# Patient Record
Sex: Male | Born: 1945
Health system: Southern US, Community
[De-identification: ages and names within clinical notes are randomized; demographics above are authoritative.]

## PROBLEM LIST (undated history)

## (undated) DIAGNOSIS — I10 Essential (primary) hypertension: Secondary | ICD-10-CM

## (undated) DIAGNOSIS — E7800 Pure hypercholesterolemia, unspecified: Secondary | ICD-10-CM

## (undated) DIAGNOSIS — E119 Type 2 diabetes mellitus without complications: Secondary | ICD-10-CM

## (undated) DIAGNOSIS — M1990 Unspecified osteoarthritis, unspecified site: Secondary | ICD-10-CM

## (undated) HISTORY — PX: MINOR HEMORRHOIDECTOMY: SHX6238

## (undated) HISTORY — DX: Pure hypercholesterolemia, unspecified: E78.00

## (undated) HISTORY — PX: LAPARASCOPIC MEDICAN ARCUATE LIGAMENT RELEASE: SHX6852

---

## 2009-04-30 ENCOUNTER — Emergency Department (HOSPITAL_COMMUNITY): Admission: EM | Admit: 2009-04-30 | Discharge: 2009-05-01 | Payer: Self-pay | Admitting: Emergency Medicine

## 2009-11-02 ENCOUNTER — Emergency Department (HOSPITAL_COMMUNITY): Admission: EM | Admit: 2009-11-02 | Discharge: 2009-11-02 | Payer: Self-pay | Admitting: Emergency Medicine

## 2010-05-11 LAB — URINE CULTURE
Colony Count: NO GROWTH
Culture  Setup Time: 201109080318
Culture: NO GROWTH

## 2010-05-11 LAB — URINALYSIS, ROUTINE W REFLEX MICROSCOPIC
Bilirubin Urine: NEGATIVE
Glucose, UA: NEGATIVE mg/dL
Hgb urine dipstick: NEGATIVE
Ketones, ur: NEGATIVE mg/dL
Nitrite: NEGATIVE
Protein, ur: NEGATIVE mg/dL
Specific Gravity, Urine: 1.007 (ref 1.005–1.030)
Urobilinogen, UA: 0.2 mg/dL (ref 0.0–1.0)
pH: 6 (ref 5.0–8.0)

## 2010-05-22 LAB — URINALYSIS, ROUTINE W REFLEX MICROSCOPIC
Bilirubin Urine: NEGATIVE
Glucose, UA: 500 mg/dL — AB
Hgb urine dipstick: NEGATIVE
Ketones, ur: NEGATIVE mg/dL
Nitrite: NEGATIVE
Protein, ur: NEGATIVE mg/dL
Specific Gravity, Urine: 1.017 (ref 1.005–1.030)
Urobilinogen, UA: 0.2 mg/dL (ref 0.0–1.0)
pH: 6.5 (ref 5.0–8.0)

## 2010-05-22 LAB — URINE CULTURE
Colony Count: NO GROWTH
Culture: NO GROWTH

## 2012-09-08 ENCOUNTER — Other Ambulatory Visit: Payer: Self-pay | Admitting: *Deleted

## 2012-09-08 DIAGNOSIS — E119 Type 2 diabetes mellitus without complications: Secondary | ICD-10-CM

## 2012-09-08 DIAGNOSIS — E785 Hyperlipidemia, unspecified: Secondary | ICD-10-CM

## 2012-09-09 ENCOUNTER — Other Ambulatory Visit (INDEPENDENT_AMBULATORY_CARE_PROVIDER_SITE_OTHER): Payer: Federal, State, Local not specified - PPO

## 2012-09-09 DIAGNOSIS — E785 Hyperlipidemia, unspecified: Secondary | ICD-10-CM

## 2012-09-09 DIAGNOSIS — E119 Type 2 diabetes mellitus without complications: Secondary | ICD-10-CM

## 2012-09-09 LAB — LIPID PANEL
Cholesterol: 160 mg/dL (ref 0–200)
HDL: 52.9 mg/dL (ref >39.00)
LDL Cholesterol: 92 mg/dL (ref 0–99)
Total CHOL/HDL Ratio: 3
Triglycerides: 77 mg/dL (ref 0.0–149.0)
VLDL: 15.4 mg/dL (ref 0.0–40.0)

## 2012-09-09 LAB — BASIC METABOLIC PANEL
BUN: 16 mg/dL (ref 6–23)
CO2: 28 mEq/L (ref 19–32)
Calcium: 9.2 mg/dL (ref 8.4–10.5)
Chloride: 108 mEq/L (ref 96–112)
Creatinine, Ser: 1 mg/dL (ref 0.4–1.5)
GFR: 81.53 mL/min (ref >60.00)
Glucose, Bld: 246 mg/dL — ABNORMAL HIGH (ref 70–99)
Potassium: 3.8 mEq/L (ref 3.5–5.1)
Sodium: 140 mEq/L (ref 135–145)

## 2012-09-09 LAB — MICROALBUMIN / CREATININE URINE RATIO
Creatinine,U: 64.3 mg/dL
Microalb Creat Ratio: 0.3 mg/g (ref 0.0–30.0)
Microalb, Ur: 0.2 mg/dL (ref 0.0–1.9)

## 2012-09-09 LAB — HEMOGLOBIN A1C: Hgb A1c MFr Bld: 6.7 % — ABNORMAL HIGH (ref 4.6–6.5)

## 2012-09-11 ENCOUNTER — Encounter: Payer: Self-pay | Admitting: Endocrinology

## 2012-09-11 ENCOUNTER — Ambulatory Visit (INDEPENDENT_AMBULATORY_CARE_PROVIDER_SITE_OTHER): Payer: Federal, State, Local not specified - PPO | Admitting: Endocrinology

## 2012-09-11 VITALS — BP 132/70 | HR 75 | Resp 12 | Ht 69.0 in | Wt 162.0 lb

## 2012-09-11 DIAGNOSIS — N400 Enlarged prostate without lower urinary tract symptoms: Secondary | ICD-10-CM

## 2012-09-11 DIAGNOSIS — IMO0001 Reserved for inherently not codable concepts without codable children: Secondary | ICD-10-CM

## 2012-09-11 DIAGNOSIS — E7800 Pure hypercholesterolemia, unspecified: Secondary | ICD-10-CM

## 2012-09-11 NOTE — Progress Notes (Signed)
Patient ID: Kenneth Lawson, male   DOB: [birth year], 67 y.o.   MRN: [id number]  Reason for Appointment: Diabetes follow-up   History of Present Illness     Reason for visit: 3 month followup.   Diagnosis: date of diagnosis: 1997.   Peripheral neuropathy: Not previously diagnosed.  Diabetic nephropathy: not present.  Side effects from medications: bloating, gas And GI distress from metformin, even 500 mg.  Proper timing of medications in relation to meals: Not applicable.  Monitors blood glucose: Once a day.  Glucometer: Accucheck.   Blood Glucose readings: Blood sugar mostly in the morning and is generally in the upper normal range  Hypoglycemia frequency: Minimal, occasionally may feel weak if going longer without eating   Meals: content is Variable, may sometimes increased carbohydrate at breakfast. Cereal at times.  Physical activity: exercise: He is recently walking up to 3 miles   Certified Diabetes Educator visit: Never.  Dietician visit: Most recent: 1997 at diagnosis.  Retinal exam: Unknown, Has seen optometrist only.   Type 2 Diabetes Mellitus here for followup   PAST history: He was previously treated with glyburide, Actos and metformin.  Intolerant to metformin; has GI side effects and this was stopped.  His blood sugars had been relatively high in the last 3-4 years and A1c had been consistently over 7.5% until his consultation His blood sugars had shown considerable improvement with  adding  Nesina since his visit in 2/14 .  he had previously been given Invokana and 10/13 which had helped modestly. He was also on Actos which he had stopped but blood sugars did not appear to worsen. His blood sugars had been higher with A1c as high as 7.4 in December 2013  RECENT history:   His blood sugars continue to be well controlled current regimen.. He  has been on Invokana along with his glyburide. However he thinks that because of increase urination and some penile  irritation he prefers to take on a half tablet, blood sugars seem to be about the same with this. No side effects from this now. Checking blood sugar is somewhat less recently and at random times  .  The last HbgA1c was 6.4 in 4/14, previously 7.4 in 12/13, prior range from 7.8-8.3 since 2011, and the fructosamine in 2/14 was 313.   The microalbumin has been tested , and the result is normal .     Appointment on 09/09/2012  Component Date Value Range Status  . Hemoglobin A1C 09/09/2012 6.7* 4.6 - 6.5 % Final   Glycemic Control Guidelines for People with Diabetes:Non Diabetic:  <6%Goal of Therapy: <7%Additional Action Suggested:  >8%   . Sodium 09/09/2012 140  135 - 145 mEq/L Final  . Potassium 09/09/2012 3.8  3.5 - 5.1 mEq/L Final  . Chloride 09/09/2012 108  96 - 112 mEq/L Final  . CO2 09/09/2012 28  19 - 32 mEq/L Final  . Glucose, Bld 09/09/2012 246* 70 - 99 mg/dL Final  . BUN 40/98/1191 16  6 - 23 mg/dL Final  . Creatinine, Ser 09/09/2012 1.0  0.4 - 1.5 mg/dL Final  . Calcium 47/82/9562 9.2  8.4 - 10.5 mg/dL Final  . GFR 13/09/6576 81.53  >60.00 mL/min Final  . Cholesterol 09/09/2012 160  0 - 200 mg/dL Final   ATP III Classification       Desirable:  < 200 mg/dL               Borderline High:  200 - 239 mg/dL  High:  > = 240 mg/dL  . Triglycerides 09/09/2012 77.0  0.0 - 149.0 mg/dL Final   Normal:  <409 mg/dLBorderline High:  150 - 199 mg/dL  . HDL 09/09/2012 52.90  >39.00 mg/dL Final  . VLDL 81/19/1478 15.4  0.0 - 40.0 mg/dL Final  . LDL Cholesterol 09/09/2012 92  0 - 99 mg/dL Final  . Total CHOL/HDL Ratio 09/09/2012 3   Final                  Men          Women1/2 Average Risk     3.4          3.3Average Risk          5.0          4.42X Average Risk          9.6          7.13X Average Risk          15.0          11.0                      . Microalb, Ur 09/09/2012 0.2  0.0 - 1.9 mg/dL Final  . Creatinine,U 29/56/2130 64.3   Final  . Microalb Creat Ratio 09/09/2012 0.3  0.0 -  30.0 mg/g Final      Medication List       This list is accurate as of: 09/11/12 12:38 PM.  Always use your most recent med list.               alfuzosin 10 MG 24 hr tablet  Commonly known as:  UROXATRAL  Take 10 mg by mouth daily.     buPROPion 300 MG 24 hr tablet  Commonly known as:  WELLBUTRIN XL  Take 300 mg by mouth daily.     dutasteride 0.5 MG capsule  Commonly known as:  AVODART  Take 0.5 mg by mouth daily.     INVOKANA 300 MG Tabs  Generic drug:  Canagliflozin  Take 300 mg by mouth.     NESINA 25 MG Tabs  Generic drug:  Alogliptin Benzoate  Take 25 mg by mouth.     simvastatin 80 MG tablet  Commonly known as:  ZOCOR  Take 80 mg by mouth at bedtime.        Allergies: No Known Allergies  No past medical history on file.  No past surgical history on file.  No family history on file.  Social History:  reports that he has quit smoking. His smoking use included Cigarettes. He smoked 0.00 packs per day. He does not have any smokeless tobacco history on file. His alcohol and drug histories are not on file.  Review of Systems -   Has had hypercholesterolemia on treatment No history of hypertension Has had history of BPH   Examination:   BP 132/70  Pulse 75  Resp 12  Ht 5\' 9"  (1.753 m)  Wt 162 lb (73.483 kg)  BMI 23.91 kg/m2  SpO2 97%  Body mass index is 23.91 kg/(m^2).   No pedal edema  Assesment/Plan:   Diabetes type 2 now showing fair control. Although his A1c is slightly high at 6.7 his home readings are fairly good He is not having any significant hypoglycemia with glyburide Since he has some side effects with 300 mg Invokana and he can continue to take half tablet Again discussed balancing breakfast with more protein since  lab glucose was over 200 with eating cereal He is doing well with maintaining his weight and trying to walk regularly now   [facility] 09/11/2012, 12:38 PM

## 2012-09-11 NOTE — Patient Instructions (Addendum)
Please check blood sugars at least half the time about 2 hours after any meal and as directed on waking up.   Please bring blood sugar monitor to each visit  

## 2012-09-15 ENCOUNTER — Encounter: Payer: Self-pay | Admitting: Endocrinology

## 2012-09-15 DIAGNOSIS — N400 Enlarged prostate without lower urinary tract symptoms: Secondary | ICD-10-CM | POA: Insufficient documentation

## 2012-09-15 DIAGNOSIS — IMO0001 Reserved for inherently not codable concepts without codable children: Secondary | ICD-10-CM | POA: Insufficient documentation

## 2012-09-15 DIAGNOSIS — E7800 Pure hypercholesterolemia, unspecified: Secondary | ICD-10-CM | POA: Insufficient documentation

## 2012-10-23 ENCOUNTER — Other Ambulatory Visit: Payer: Self-pay | Admitting: *Deleted

## 2012-10-23 MED ORDER — ALOGLIPTIN BENZOATE 25 MG PO TABS: 25.0000 mg | ORAL_TABLET | Freq: Every day | ORAL | Status: DC

## 2012-12-08 ENCOUNTER — Other Ambulatory Visit: Payer: Federal, State, Local not specified - PPO

## 2012-12-09 ENCOUNTER — Other Ambulatory Visit (INDEPENDENT_AMBULATORY_CARE_PROVIDER_SITE_OTHER): Payer: Federal, State, Local not specified - PPO

## 2012-12-09 DIAGNOSIS — IMO0001 Reserved for inherently not codable concepts without codable children: Secondary | ICD-10-CM

## 2012-12-09 DIAGNOSIS — E7800 Pure hypercholesterolemia, unspecified: Secondary | ICD-10-CM

## 2012-12-09 LAB — COMPREHENSIVE METABOLIC PANEL
ALT: 18 U/L (ref 0–53)
AST: 15 U/L (ref 0–37)
Albumin: 4.2 g/dL (ref 3.5–5.2)
Alkaline Phosphatase: 53 U/L (ref 39–117)
BUN: 17 mg/dL (ref 6–23)
CO2: 27 mEq/L (ref 19–32)
Calcium: 9.1 mg/dL (ref 8.4–10.5)
Chloride: 106 mEq/L (ref 96–112)
Creatinine, Ser: 1 mg/dL (ref 0.4–1.5)
GFR: 84.44 mL/min (ref >60.00)
Glucose, Bld: 137 mg/dL — ABNORMAL HIGH (ref 70–99)
Potassium: 4.3 mEq/L (ref 3.5–5.1)
Sodium: 141 mEq/L (ref 135–145)
Total Bilirubin: 0.4 mg/dL (ref 0.3–1.2)
Total Protein: 7.1 g/dL (ref 6.0–8.3)

## 2012-12-09 LAB — LIPID PANEL
Cholesterol: 166 mg/dL (ref 0–200)
HDL: 57 mg/dL (ref >39.00)
LDL Cholesterol: 100 mg/dL — ABNORMAL HIGH (ref 0–99)
Total CHOL/HDL Ratio: 3
Triglycerides: 46 mg/dL (ref 0.0–149.0)
VLDL: 9.2 mg/dL (ref 0.0–40.0)

## 2012-12-09 LAB — MICROALBUMIN / CREATININE URINE RATIO
Creatinine,U: 73.2 mg/dL
Microalb Creat Ratio: 0.5 mg/g (ref 0.0–30.0)
Microalb, Ur: 0.4 mg/dL (ref 0.0–1.9)

## 2012-12-09 LAB — HEMOGLOBIN A1C: Hgb A1c MFr Bld: 7.2 % — ABNORMAL HIGH (ref 4.6–6.5)

## 2012-12-12 ENCOUNTER — Encounter: Payer: Self-pay | Admitting: Endocrinology

## 2012-12-12 ENCOUNTER — Ambulatory Visit (INDEPENDENT_AMBULATORY_CARE_PROVIDER_SITE_OTHER): Payer: Federal, State, Local not specified - PPO | Admitting: Endocrinology

## 2012-12-12 VITALS — BP 130/82 | HR 75 | Temp 98.5°F | Resp 12 | Ht 69.0 in | Wt 165.1 lb

## 2012-12-12 DIAGNOSIS — E7800 Pure hypercholesterolemia, unspecified: Secondary | ICD-10-CM

## 2012-12-12 DIAGNOSIS — IMO0001 Reserved for inherently not codable concepts without codable children: Secondary | ICD-10-CM

## 2012-12-12 DIAGNOSIS — Z23 Encounter for immunization: Secondary | ICD-10-CM

## 2012-12-12 NOTE — Progress Notes (Signed)
Patient ID: Kenneth Lawson, male   DOB: 1946/02/05, 67 y.o.   MRN: [id number]  Reason for Appointment: Diabetes follow-up   History of Present Illness     Diagnosis: date of diagnosis: 1997.   RECENT history:   His blood sugars are relatively higher than before and his A1c is increased slightly. His A1c has been as low as 6.4% previously He thinks this is primarily related to not exercising and also not being consistent with his diet  He  has been on Invokana along with his glyburide and NESINA. Takes Invokana half tablet twice a day which is better tolerable  Checking blood sugar is somewhat less recently and at random times   Monitors blood glucose: Once -2x a day.  Glucometer: Accucheck.   Blood Glucose readings: Blood sugar being checked mostly in the morning. Fasting readings are about 120, highest reading 200 after eating and lowest 71, did not bring monitor for download  Hypoglycemia frequency: Minimal, none recently   Meals: content is Variable, may sometimes have increased carbohydrate without any protein in the morning Snacks: chips  at times.  Physical activity: exercise: He is recently not walking, has no motivation  Dietician visit: Most recent: 1997 at diagnosis.  Retinal exam: Unknown, Has seen optometrist only.   Peripheral neuropathy: Not previously diagnosed.  Diabetic nephropathy: not present.  Side effects from medications: bloating, gas And GI distress from metformin, even 500 mg.   PAST history: He was previously treated with glyburide, Actos and metformin.  Intolerant to metformin; had GI side effects and this was stopped.  His blood sugars had been relatively high in the last 3-4 years and A1c had been consistently over 7.5% until his initial consultation His blood sugars had shown considerable improvement with  adding  Nesina since 2/14 .  He had previously been given Invokana in 10/13 which had helped modestly. He was also on Actos which he had  stopped but blood sugars did not appear to worsen. .  The previous HbgA1c levels: 6.4 in 4/14, previously 7.4 in 12/13, prior range from 7.8-8.3 since 2011, and the fructosamine in 2/14 was 313.   The microalbumin has been tested , and the result is normal .    Lab Results  Component Value Date   HGBA1C 7.2* 12/09/2012   HGBA1C 6.7* 09/09/2012   Lab Results  Component Value Date   MICROALBUR 0.4 12/09/2012   LDLCALC 100* 12/09/2012   CREATININE 1.0 12/09/2012    Appointment on 12/09/2012  Component Date Value Range Status  . Microalb, Ur 12/09/2012 0.4  0.0 - 1.9 mg/dL Final  . Creatinine,U 56/21/3086 73.2   Final  . Microalb Creat Ratio 12/09/2012 0.5  0.0 - 30.0 mg/g Final  . Hemoglobin A1C 12/09/2012 7.2* 4.6 - 6.5 % Final   Glycemic Control Guidelines for People with Diabetes:Non Diabetic:  <6%Goal of Therapy: <7%Additional Action Suggested:  >8%   . Cholesterol 12/09/2012 166  0 - 200 mg/dL Final   ATP III Classification       Desirable:  < 200 mg/dL               Borderline High:  200 - 239 mg/dL          High:  > = 578 mg/dL  . Triglycerides 12/09/2012 46.0  0.0 - 149.0 mg/dL Final   Normal:  <469 mg/dLBorderline High:  150 - 199 mg/dL  . HDL 12/09/2012 57.00  >39.00 mg/dL Final  . VLDL 62/95/2841 9.2  0.0 - 40.0 mg/dL Final  . LDL Cholesterol 12/09/2012 100* 0 - 99 mg/dL Final  . Total CHOL/HDL Ratio 12/09/2012 3   Final                  Men          Women1/2 Average Risk     3.4          3.3Average Risk          5.0          4.42X Average Risk          9.6          7.13X Average Risk          15.0          11.0                      . Sodium 12/09/2012 141  135 - 145 mEq/L Final  . Potassium 12/09/2012 4.3  3.5 - 5.1 mEq/L Final  . Chloride 12/09/2012 106  96 - 112 mEq/L Final  . CO2 12/09/2012 27  19 - 32 mEq/L Final  . Glucose, Bld 12/09/2012 137* 70 - 99 mg/dL Final  . BUN 19/14/7829 17  6 - 23 mg/dL Final  . Creatinine, Ser 12/09/2012 1.0  0.4 - 1.5 mg/dL Final  .  Total Bilirubin 12/09/2012 0.4  0.3 - 1.2 mg/dL Final  . Alkaline Phosphatase 12/09/2012 53  39 - 117 U/L Final  . AST 12/09/2012 15  0 - 37 U/L Final  . ALT 12/09/2012 18  0 - 53 U/L Final  . Total Protein 12/09/2012 7.1  6.0 - 8.3 g/dL Final  . Albumin 56/21/3086 4.2  3.5 - 5.2 g/dL Final  . Calcium 57/84/6962 9.1  8.4 - 10.5 mg/dL Final  . GFR 95/28/4132 84.44  >60.00 mL/min Final      Medication List       This list is accurate as of: 12/12/12 11:36 AM.  Always use your most recent med list.               ACCU-CHEK AVIVA PLUS test strip  Generic drug:  glucose blood  2 (two) times daily.     alfuzosin 10 MG 24 hr tablet  Commonly known as:  UROXATRAL  Take 10 mg by mouth daily.     Alogliptin Benzoate 25 MG Tabs  Commonly known as:  NESINA  Take 25 mg by mouth daily.     buPROPion 300 MG 24 hr tablet  Commonly known as:  WELLBUTRIN XL  Take 300 mg by mouth daily.     dutasteride 0.5 MG capsule  Commonly known as:  AVODART  Take 0.5 mg by mouth daily.     glyBURIDE 5 MG tablet  Commonly known as:  DIABETA  5 mg.     INVOKANA 300 MG Tabs  Generic drug:  Canagliflozin  Take 300 mg by mouth.     omeprazole 40 MG capsule  Commonly known as:  PRILOSEC  as needed.     simvastatin 80 MG tablet  Commonly known as:  ZOCOR  Take 80 mg by mouth at bedtime.        Allergies: No Known Allergies  No past medical history on file.  Past Surgical History  Procedure Laterality Date  . Minor hemorrhoidectomy N/A     Family History  Problem Relation Age of Onset  . Cancer Neg Hx  Social History:  reports that he has quit smoking. His smoking use included Cigarettes. He smoked 0.00 packs per day. He does not have any smokeless tobacco history on file. His alcohol and drug histories are not on file.  Review of Systems -   Has had hypercholesterolemia on simvastatin 80 mg. LDL is 100, should improve with better diet No history of hypertension Has had  history of BPH   Examination:   BP 130/82  Pulse 75  Temp(Src) 98.5 F (36.9 C)  Resp 12  Ht 5\' 9"  (1.753 m)  Wt 165 lb 1.6 oz (74.889 kg)  BMI 24.37 kg/m2  SpO2 97%  Body mass index is 24.37 kg/(m^2).   No pedal edema  Assesment/Plan:   Diabetes type 2 now showing less adequate control compared to last time As discussed in history of present illness he is having less compliance with his diet and exercise This he thinks is from lack of motivation and some depression Also did not bring his monitor for download A1c has increased somewhat and he is also not pleased about this  He is going to try and improve his diet and exercise regimen. Also discussed adding protein to breakfast consistently For now will continue the same medication regimen   Gregory Barrick 12/12/2012, 11:36 AM

## 2012-12-12 NOTE — Patient Instructions (Addendum)
Less carbs and more protein especially at Breakfast   Walk daily

## 2013-02-16 ENCOUNTER — Other Ambulatory Visit: Payer: Self-pay | Admitting: *Deleted

## 2013-02-16 MED ORDER — ALOGLIPTIN BENZOATE 25 MG PO TABS: 25.0000 mg | ORAL_TABLET | Freq: Every day | ORAL | Status: DC

## 2013-03-13 ENCOUNTER — Other Ambulatory Visit: Payer: Self-pay | Admitting: *Deleted

## 2013-03-13 MED ORDER — NYSTATIN 100000 UNIT/GM EX CREA: 1.0000 "application " | TOPICAL_CREAM | Freq: Two times a day (BID) | CUTANEOUS | Status: DC

## 2013-03-17 ENCOUNTER — Other Ambulatory Visit: Payer: Federal, State, Local not specified - PPO

## 2013-03-20 ENCOUNTER — Ambulatory Visit (INDEPENDENT_AMBULATORY_CARE_PROVIDER_SITE_OTHER): Payer: Federal, State, Local not specified - PPO | Admitting: Endocrinology

## 2013-03-20 ENCOUNTER — Encounter: Payer: Self-pay | Admitting: Endocrinology

## 2013-03-20 VITALS — BP 134/68 | HR 103 | Temp 98.2°F | Resp 14 | Ht 69.0 in | Wt 162.0 lb

## 2013-03-20 DIAGNOSIS — E7800 Pure hypercholesterolemia, unspecified: Secondary | ICD-10-CM

## 2013-03-20 DIAGNOSIS — E1165 Type 2 diabetes mellitus with hyperglycemia: Principal | ICD-10-CM

## 2013-03-20 DIAGNOSIS — IMO0001 Reserved for inherently not codable concepts without codable children: Secondary | ICD-10-CM

## 2013-03-20 DIAGNOSIS — F329 Major depressive disorder, single episode, unspecified: Secondary | ICD-10-CM

## 2013-03-20 DIAGNOSIS — F3289 Other specified depressive episodes: Secondary | ICD-10-CM

## 2013-03-20 DIAGNOSIS — F32A Depression, unspecified: Secondary | ICD-10-CM

## 2013-03-20 LAB — COMPREHENSIVE METABOLIC PANEL
ALT: 22 U/L (ref 0–53)
AST: 18 U/L (ref 0–37)
Albumin: 4.3 g/dL (ref 3.5–5.2)
Alkaline Phosphatase: 60 U/L (ref 39–117)
BUN: 20 mg/dL (ref 6–23)
CO2: 23 mEq/L (ref 19–32)
Calcium: 9.3 mg/dL (ref 8.4–10.5)
Chloride: 109 mEq/L (ref 96–112)
Creatinine, Ser: 1 mg/dL (ref 0.4–1.5)
GFR: 80.45 mL/min (ref >60.00)
Glucose, Bld: 196 mg/dL — ABNORMAL HIGH (ref 70–99)
Potassium: 4.3 mEq/L (ref 3.5–5.1)
Sodium: 138 mEq/L (ref 135–145)
Total Bilirubin: 0.7 mg/dL (ref 0.3–1.2)
Total Protein: 7.3 g/dL (ref 6.0–8.3)

## 2013-03-20 LAB — HEMOGLOBIN A1C: Hgb A1c MFr Bld: 7.4 % — ABNORMAL HIGH (ref 4.6–6.5)

## 2013-03-20 NOTE — Patient Instructions (Signed)
Oseni 25/30 daily  Please check blood sugars at least half the time about 2 hours after any meal and as directed on waking up. Please bring blood sugar monitor to each visit  Exercise!

## 2013-03-20 NOTE — Progress Notes (Addendum)
Patient ID: Kenneth Lawson, male   DOB: 02/21/1946, 68 y.o.   MRN: [id number]  Reason for Appointment: Diabetes follow-up   History of Present Illness    Diagnosis: date of diagnosis: 1997.   Previous history: He was previously treated with glyburide, Actos and metformin.  Metformin was stopped because he had GI side effects  His blood sugars had been relatively high in the last 3-4 years and A1c had been consistently over 7.5% until his initial consultation He was started on Invokana in 10/13 which had helped modestly. His blood sugars had shown considerable improvement with  adding  Nesina since 2/14 .    He was also on Actos which he had stopped but blood sugars did not appear to worsen. .  The previous HbgA1c levels: 6.4 in 4/14,  7.4 in 12/13, prior range from 7.8-8.3 since 2011   RECENT history:   His blood sugars are periodically higher than before and he thinks this is because of poor diet especially last month when he was more depressed. However because of smaller portions he has actually lost weight Has not been checking his blood sugar lately Also his meter has incorrect date and time He is also not exercising   He  has been on Invokana along with his glyburide and NESINA. Takes Invokana half tablet twice a day usually which is better tolerable. Has occasional balanitis treated with antifungal cream adequately Side effects from medications: bloating, gas And GI distress from metformin, even 500 mg.   Monitors blood glucose: 1 -2x a day.  Glucometer: Accucheck. Blood Glucose readings:   PREMEAL Breakfast Lunch Dinner Bedtime Overall  Glucose range:  120-230    161   110-279    Mean/median:  168     220   188    Hypoglycemia frequency: Minimal, none recently   Meals: content is Variable, less in portions but was eating more sweets Snacks: chips  at times.  Physical activity: exercise: He is walking the dog only, no brisk exercise as before  Dietician visit: Most  recent: 1997 at diagnosis.  Retinal exam: Unknown, Has seen optometrist only.   Peripheral neuropathy: Not previously diagnosed.  Diabetic nephropathy: not present.    Wt Readings from Last 3 Encounters:  03/20/13 162 lb (73.483 kg)  12/12/12 165 lb 1.6 oz (74.889 kg)  09/11/12 162 lb (73.483 kg)    Lab Results  Component Value Date   HGBA1C 7.2* 12/09/2012   HGBA1C 6.7* 09/09/2012   Lab Results  Component Value Date   MICROALBUR 0.4 12/09/2012   LDLCALC 100* 12/09/2012   CREATININE 1.0 12/09/2012    No visits with results within 1 Week(s) from this visit. Latest known visit with results is:  Appointment on 12/09/2012  Component Date Value Range Status  . Microalb, Ur 12/09/2012 0.4  0.0 - 1.9 mg/dL Final  . Creatinine,U 74/25/9563 73.2   Final  . Microalb Creat Ratio 12/09/2012 0.5  0.0 - 30.0 mg/g Final  . Hemoglobin A1C 12/09/2012 7.2* 4.6 - 6.5 % Final   Glycemic Control Guidelines for People with Diabetes:Non Diabetic:  <6%Goal of Therapy: <7%Additional Action Suggested:  >8%   . Cholesterol 12/09/2012 166  0 - 200 mg/dL Final   ATP III Classification       Desirable:  < 200 mg/dL               Borderline High:  200 - 239 mg/dL          High:  > =  240 mg/dL  . Triglycerides 12/09/2012 46.0  0.0 - 149.0 mg/dL Final   Normal:  <132<150 mg/dLBorderline High:  150 - 199 mg/dL  . HDL 12/09/2012 57.00  >39.00 mg/dL Final  . VLDL 44/01/027210/14/2014 9.2  0.0 - 53.640.0 mg/dL Final  . LDL Cholesterol 12/09/2012 100* 0 - 99 mg/dL Final  . Total CHOL/HDL Ratio 12/09/2012 3   Final                  Men          Women1/2 Average Risk     3.4          3.3Average Risk          5.0          4.42X Average Risk          9.6          7.13X Average Risk          15.0          11.0                      . Sodium 12/09/2012 141  135 - 145 mEq/L Final  . Potassium 12/09/2012 4.3  3.5 - 5.1 mEq/L Final  . Chloride 12/09/2012 106  96 - 112 mEq/L Final  . CO2 12/09/2012 27  19 - 32 mEq/L Final  . Glucose,  Bld 12/09/2012 137* 70 - 99 mg/dL Final  . BUN 64/40/347410/14/2014 17  6 - 23 mg/dL Final  . Creatinine, Ser 12/09/2012 1.0  0.4 - 1.5 mg/dL Final  . Total Bilirubin 12/09/2012 0.4  0.3 - 1.2 mg/dL Final  . Alkaline Phosphatase 12/09/2012 53  39 - 117 U/L Final  . AST 12/09/2012 15  0 - 37 U/L Final  . ALT 12/09/2012 18  0 - 53 U/L Final  . Total Protein 12/09/2012 7.1  6.0 - 8.3 g/dL Final  . Albumin 25/95/638710/14/2014 4.2  3.5 - 5.2 g/dL Final  . Calcium 56/43/329510/14/2014 9.1  8.4 - 10.5 mg/dL Final  . GFR 18/84/166010/14/2014 84.44  >60.00 mL/min Final      Medication List       This list is accurate as of: 03/20/13 11:19 AM.  Always use your most recent med list.               ACCU-CHEK AVIVA PLUS test strip  Generic drug:  glucose blood  2 (two) times daily.     alfuzosin 10 MG 24 hr tablet  Commonly known as:  UROXATRAL  Take 10 mg by mouth daily.     Alogliptin Benzoate 25 MG Tabs  Commonly known as:  NESINA  Take 25 mg by mouth daily.     buPROPion 300 MG 24 hr tablet  Commonly known as:  WELLBUTRIN XL  Take 300 mg by mouth daily.     dutasteride 0.5 MG capsule  Commonly known as:  AVODART  Take 0.5 mg by mouth daily.     glyBURIDE 5 MG tablet  Commonly known as:  DIABETA  5 mg.     hydrocortisone-pramoxine 2.5-1 % rectal cream  Commonly known as:  ANALPRAM-HC     INVOKANA 300 MG Tabs  Generic drug:  Canagliflozin  Take 300 mg by mouth.     nystatin cream  Commonly known as:  MYCOSTATIN  Apply 1 application topically 2 (two) times daily.     omeprazole 40 MG capsule  Commonly known as:  PRILOSEC  as needed.     simvastatin 80 MG tablet  Commonly known as:  ZOCOR  Take 80 mg by mouth at bedtime.        Allergies: No Known Allergies  No past medical history on file.  Past Surgical History  Procedure Laterality Date  . Minor hemorrhoidectomy N/A     Family History  Problem Relation Age of Onset  . Cancer Neg Hx     Social History:  reports that he has quit  smoking. His smoking use included Cigarettes. He smoked 0.00 packs per day. He does not have any smokeless tobacco history on file. His alcohol and drug histories are not on file.  Review of Systems -   Has had hypercholesterolemia on simvastatin 80 mg.   Lab Results  Component Value Date   CHOL 166 12/09/2012   HDL 57.00 12/09/2012   LDLCALC 100* 12/09/2012   TRIG 46.0 12/09/2012   CHOLHDL 3 12/09/2012    No history of hypertension  Has  history of BPH   Examination:   BP 134/68  Pulse 103  Temp(Src) 98.2 F (36.8 C)  Resp 14  Ht 5\' 9"  (1.753 m)  Wt 162 lb (73.483 kg)  BMI 23.91 kg/m2  SpO2 97%  Body mass index is 23.91 kg/(m^2).    Assesment/Plan:   Diabetes type 2 without obesity: His blood sugar control is significantly worse with most readings over 200 after dinner especially last month. This is from eating poorly and not exercising enough He is somewhat more motivated to do better now Also may be having some progression of his diabetes A1c pending from today  Recommendations made:  Balanced diet with enough protein including at breakfast  Restart exercise program  Test blood sugar regularly especially 2 hours after meals  Restart Actos 30 mg daily to help with insulin resistance and stabilizing beta cells. For convenience he can combine this with his NESINA, samples given today  Continue glyburide 5 mg and Invokana   Jerusalen Mateja 03/20/2013, 11:19 AM   Office Visit on 03/20/2013  Component Date Value Range Status  . Hemoglobin A1C 03/20/2013 7.4* 4.6 - 6.5 % Final   Glycemic Control Guidelines for People with Diabetes:Non Diabetic:  <6%Goal of Therapy: <7%Additional Action Suggested:  >8%   . Sodium 03/20/2013 138  135 - 145 mEq/L Final  . Potassium 03/20/2013 4.3  3.5 - 5.1 mEq/L Final  . Chloride 03/20/2013 109  96 - 112 mEq/L Final  . CO2 03/20/2013 23  19 - 32 mEq/L Final  . Glucose, Bld 03/20/2013 196* 70 - 99 mg/dL Final  . BUN 40/98/1191 20  6  - 23 mg/dL Final  . Creatinine, Ser 03/20/2013 1.0  0.4 - 1.5 mg/dL Final  . Total Bilirubin 03/20/2013 0.7  0.3 - 1.2 mg/dL Final  . Alkaline Phosphatase 03/20/2013 60  39 - 117 U/L Final  . AST 03/20/2013 18  0 - 37 U/L Final  . ALT 03/20/2013 22  0 - 53 U/L Final  . Total Protein 03/20/2013 7.3  6.0 - 8.3 g/dL Final  . Albumin 47/82/9562 4.3  3.5 - 5.2 g/dL Final  . Calcium 13/09/6576 9.3  8.4 - 10.5 mg/dL Final  . GFR 46/96/2952 80.45  >60.00 mL/min Final

## 2013-03-21 DIAGNOSIS — F32A Depression, unspecified: Secondary | ICD-10-CM | POA: Insufficient documentation

## 2013-03-21 DIAGNOSIS — F329 Major depressive disorder, single episode, unspecified: Secondary | ICD-10-CM | POA: Insufficient documentation

## 2013-03-21 MED ORDER — ALOGLIPTIN-PIOGLITAZONE 25-30 MG PO TABS: 25.0000 mg | ORAL_TABLET | Freq: Every day | ORAL | Status: DC

## 2013-03-30 ENCOUNTER — Other Ambulatory Visit: Payer: Self-pay | Admitting: *Deleted

## 2013-03-30 MED ORDER — CANAGLIFLOZIN 300 MG PO TABS: 300.0000 mg | ORAL_TABLET | Freq: Every day | ORAL | Status: DC

## 2013-05-01 ENCOUNTER — Other Ambulatory Visit (INDEPENDENT_AMBULATORY_CARE_PROVIDER_SITE_OTHER): Payer: Federal, State, Local not specified - PPO

## 2013-05-01 DIAGNOSIS — IMO0001 Reserved for inherently not codable concepts without codable children: Secondary | ICD-10-CM

## 2013-05-01 DIAGNOSIS — E1165 Type 2 diabetes mellitus with hyperglycemia: Principal | ICD-10-CM

## 2013-05-01 LAB — LIPID PANEL
Cholesterol: 167 mg/dL (ref 0–200)
HDL: 59.1 mg/dL (ref >39.00)
LDL Cholesterol: 103 mg/dL — ABNORMAL HIGH (ref 0–99)
Total CHOL/HDL Ratio: 3
Triglycerides: 27 mg/dL (ref 0.0–149.0)
VLDL: 5.4 mg/dL (ref 0.0–40.0)

## 2013-05-01 LAB — COMPREHENSIVE METABOLIC PANEL
ALT: 21 U/L (ref 0–53)
AST: 16 U/L (ref 0–37)
Albumin: 4 g/dL (ref 3.5–5.2)
Alkaline Phosphatase: 51 U/L (ref 39–117)
BUN: 22 mg/dL (ref 6–23)
CO2: 25 mEq/L (ref 19–32)
Calcium: 9.3 mg/dL (ref 8.4–10.5)
Chloride: 109 mEq/L (ref 96–112)
Creatinine, Ser: 1 mg/dL (ref 0.4–1.5)
GFR: 81.37 mL/min (ref >60.00)
Glucose, Bld: 143 mg/dL — ABNORMAL HIGH (ref 70–99)
Potassium: 4.3 mEq/L (ref 3.5–5.1)
Sodium: 142 mEq/L (ref 135–145)
Total Bilirubin: 0.6 mg/dL (ref 0.3–1.2)
Total Protein: 6.9 g/dL (ref 6.0–8.3)

## 2013-05-02 LAB — FRUCTOSAMINE: Fructosamine: 297 umol/L — ABNORMAL HIGH (ref <285)

## 2013-05-05 ENCOUNTER — Ambulatory Visit: Payer: Federal, State, Local not specified - PPO | Admitting: Endocrinology

## 2013-05-06 ENCOUNTER — Encounter: Payer: Self-pay | Admitting: Endocrinology

## 2013-05-06 ENCOUNTER — Ambulatory Visit (INDEPENDENT_AMBULATORY_CARE_PROVIDER_SITE_OTHER): Payer: Federal, State, Local not specified - PPO | Admitting: Endocrinology

## 2013-05-06 VITALS — BP 154/76 | HR 90 | Temp 97.9°F | Resp 16 | Ht 69.0 in | Wt 164.8 lb

## 2013-05-06 DIAGNOSIS — E1165 Type 2 diabetes mellitus with hyperglycemia: Principal | ICD-10-CM

## 2013-05-06 DIAGNOSIS — E7800 Pure hypercholesterolemia, unspecified: Secondary | ICD-10-CM

## 2013-05-06 DIAGNOSIS — IMO0001 Reserved for inherently not codable concepts without codable children: Secondary | ICD-10-CM

## 2013-05-06 NOTE — Patient Instructions (Signed)
Watch Carbs, more exercise

## 2013-05-06 NOTE — Progress Notes (Signed)
Patient ID: Kenneth Lawson, male   DOB: 15-Jun-1945, 68 y.o.   MRN: [id number]   Reason for Appointment: Diabetes follow-up    History of Present Illness    Diagnosis: date of diagnosis: 1997.   Previous history: He was previously treated with glyburide, Actos and metformin.  Metformin was stopped because he had GI side effects  His blood sugars had been relatively high in the last 3-4 years and A1c had been consistently over 7.5% until his initial consultation He was started on Invokana in 10/13 which had helped modestly. His blood sugars had shown considerable improvement with  adding  Nesina since 2/14 .    He was also on Actos which he had stopped but blood sugars did not appear to worsen. .  The previous HbgA1c levels: 6.4 in 4/14,  7.4 in 12/13, prior range from 7.8-8.3 since 2011   RECENT history:  His blood sugars are again higher periodically during the day but not consistently. He thinks this is again because of inconsistent diet and sometimes getting more carbohydrates with meals and snacks Also has not been exercising regularly but only doing some walking His overall blood sugar appears to be better, previously his average was about 188 Because of higher readings he was started on Actos also on the last visit which he had previously taken; has had no edema or unusual weight gain with this  He  has been on Invokana also and tolerating  half tablet twice a day  Oral hypoglycemic drugs: Oseni, Invokana, glyburide  Side effects from medications: bloating, gas And GI distress from metformin, even 500 mg.   Monitors blood glucose: 1 -2x a day.  Glucometer: Accucheck. Blood Glucose readings:   PREMEAL Breakfast Lunch afternoon   late p.m.  Overall  Glucose range:  98-185   86-229   104-217   125-186    Mean/median:     150   Hypoglycemia frequency: Minimal, none recently   Meals: content is Variable, sometimes has more Carbs, occasionally sweets Snacks: chips  Physical  activity: exercise: He is walking the dog about a mile   Dietician visit: Most recent: 1997 at diagnosis.  Retinal exam: Unknown, Has seen optometrist only.   Peripheral neuropathy: Not previously diagnosed.  Diabetic nephropathy: not present.    Wt Readings from Last 3 Encounters:  05/06/13 164 lb 12.8 oz (74.753 kg)  03/20/13 162 lb (73.483 kg)  12/12/12 165 lb 1.6 oz (74.889 kg)    Lab Results  Component Value Date   HGBA1C 7.4* 03/20/2013   HGBA1C 7.2* 12/09/2012   HGBA1C 6.7* 09/09/2012   Lab Results  Component Value Date   MICROALBUR 0.4 12/09/2012   LDLCALC 103* 05/01/2013   CREATININE 1.0 05/01/2013    Appointment on 05/01/2013  Component Date Value Ref Range Status  . Sodium 05/01/2013 142  135 - 145 mEq/L Final  . Potassium 05/01/2013 4.3  3.5 - 5.1 mEq/L Final  . Chloride 05/01/2013 109  96 - 112 mEq/L Final  . CO2 05/01/2013 25  19 - 32 mEq/L Final  . Glucose, Bld 05/01/2013 143* 70 - 99 mg/dL Final  . BUN 40/98/1191 22  6 - 23 mg/dL Final  . Creatinine, Ser 05/01/2013 1.0  0.4 - 1.5 mg/dL Final  . Total Bilirubin 05/01/2013 0.6  0.3 - 1.2 mg/dL Final  . Alkaline Phosphatase 05/01/2013 51  39 - 117 U/L Final  . AST 05/01/2013 16  0 - 37 U/L Final  . ALT 05/01/2013 21  0 - 53 U/L Final  . Total Protein 05/01/2013 6.9  6.0 - 8.3 g/dL Final  . Albumin 16/11/9602 4.0  3.5 - 5.2 g/dL Final  . Calcium 54/10/8117 9.3  8.4 - 10.5 mg/dL Final  . GFR 14/78/2956 81.37  >60.00 mL/min Final  . Fructosamine 05/01/2013 297* <285 umol/L Final   Comment:                            Variations in levels of serum proteins (albumin and immunoglobulins)                          may affect fructosamine results.                             . Cholesterol 05/01/2013 167  0 - 200 mg/dL Final   ATP III Classification       Desirable:  < 200 mg/dL               Borderline High:  200 - 239 mg/dL          High:  > = 213 mg/dL  . Triglycerides 05/01/2013 27.0  0.0 - 149.0 mg/dL Final    Normal:  <086 mg/dLBorderline High:  150 - 199 mg/dL  . HDL 05/01/2013 59.10  >39.00 mg/dL Final  . VLDL 57/84/6962 5.4  0.0 - 95.2 mg/dL Final  . LDL Cholesterol 05/01/2013 103* 0 - 99 mg/dL Final  . Total CHOL/HDL Ratio 05/01/2013 3   Final                  Men          Women1/2 Average Risk     3.4          3.3Average Risk          5.0          4.42X Average Risk          9.6          7.13X Average Risk          15.0          11.0                          Medication List       This list is accurate as of: 05/06/13  3:05 PM.  Always use your most recent med list.               ACCU-CHEK AVIVA PLUS test strip  Generic drug:  glucose blood  2 (two) times daily.     ACCU-CHEK SOFTCLIX LANCETS lancets     alfuzosin 10 MG 24 hr tablet  Commonly known as:  UROXATRAL  Take 10 mg by mouth daily.     Alogliptin-Pioglitazone 25-30 MG Tabs  Commonly known as:  OSENI  Take 25 mg by mouth daily.     buPROPion 300 MG 24 hr tablet  Commonly known as:  WELLBUTRIN XL  Take 300 mg by mouth daily.     Canagliflozin 300 MG Tabs  Commonly known as:  INVOKANA  Take 1 tablet (300 mg total) by mouth daily.     dutasteride 0.5 MG capsule  Commonly known as:  AVODART  Take 0.5 mg by mouth daily.     glyBURIDE 5 MG tablet  Commonly known as:  DIABETA  5 mg.     hydrocortisone-pramoxine 2.5-1 % rectal cream  Commonly known as:  ANALPRAM-HC     levofloxacin 500 MG tablet  Commonly known as:  LEVAQUIN     nystatin cream  Commonly known as:  MYCOSTATIN  Apply 1 application topically 2 (two) times daily.     omeprazole 40 MG capsule  Commonly known as:  PRILOSEC  as needed.     prednisoLONE acetate 1 % ophthalmic suspension  Commonly known as:  PRED FORTE     simvastatin 80 MG tablet  Commonly known as:  ZOCOR  Take 80 mg by mouth at bedtime.        Allergies: No Known Allergies  No past medical history on file.  Past Surgical History  Procedure Laterality Date  . Minor  hemorrhoidectomy N/A     Family History  Problem Relation Age of Onset  . Cancer Neg Hx     Social History:  reports that he has quit smoking. His smoking use included Cigarettes. He smoked 0.00 packs per day. He does not have any smokeless tobacco history on file. His alcohol and drug histories are not on file.  Review of Systems:   Has had hypercholesterolemia on simvastatin 80 mg.   Lab Results  Component Value Date   CHOL 167 05/01/2013   HDL 59.10 05/01/2013   LDLCALC 103* 05/01/2013   TRIG 27.0 05/01/2013   CHOLHDL 3 05/01/2013    No history of hypertension  Has  history of BPH and is due to have a prostate biopsy, he is concerned about this   Examination:   BP 154/76  Pulse 90  Temp(Src) 97.9 F (36.6 C)  Resp 16  Ht 5\' 9"  (1.753 m)  Wt 164 lb 12.8 oz (74.753 kg)  BMI 24.33 kg/m2  SpO2 96%  Body mass index is 24.33 kg/(m^2).    Assesment/Plan:   Diabetes type 2 without obesity: His blood sugar control is improving with adding Actos in 1/15 and he is now on a 4 drug regimen His fructosamine is mildly increased at 297 but recent blood sugars appear to be improving He has sporadic readings near 200 based on excessive carbohydrate intake He thinks he can get his sugar better controlled with improving his diet Also has started to walk He may be having some progression of his diabetes  A1c to be checked on followup   Calum Cormier 05/06/2013, 3:05 PM   LABS:  Appointment on 05/01/2013  Component Date Value Ref Range Status  . Sodium 05/01/2013 142  135 - 145 mEq/L Final  . Potassium 05/01/2013 4.3  3.5 - 5.1 mEq/L Final  . Chloride 05/01/2013 109  96 - 112 mEq/L Final  . CO2 05/01/2013 25  19 - 32 mEq/L Final  . Glucose, Bld 05/01/2013 143* 70 - 99 mg/dL Final  . BUN 16/11/9602 22  6 - 23 mg/dL Final  . Creatinine, Ser 05/01/2013 1.0  0.4 - 1.5 mg/dL Final  . Total Bilirubin 05/01/2013 0.6  0.3 - 1.2 mg/dL Final  . Alkaline Phosphatase 05/01/2013 51  39 - 117 U/L  Final  . AST 05/01/2013 16  0 - 37 U/L Final  . ALT 05/01/2013 21  0 - 53 U/L Final  . Total Protein 05/01/2013 6.9  6.0 - 8.3 g/dL Final  . Albumin 54/10/8117 4.0  3.5 - 5.2 g/dL Final  . Calcium 14/78/2956 9.3  8.4 - 10.5 mg/dL Final  . GFR 21/30/8657 81.37  >  60.00 mL/min Final  . Fructosamine 05/01/2013 297* <285 umol/L Final   Comment:                            Variations in levels of serum proteins (albumin and immunoglobulins)                          may affect fructosamine results.                             . Cholesterol 05/01/2013 167  0 - 200 mg/dL Final   ATP III Classification       Desirable:  < 200 mg/dL               Borderline High:  200 - 239 mg/dL          High:  > = 956240 mg/dL  . Triglycerides 05/01/2013 27.0  0.0 - 149.0 mg/dL Final   Normal:  <213<150 mg/dLBorderline High:  150 - 199 mg/dL  . HDL 05/01/2013 59.10  >39.00 mg/dL Final  . VLDL 08/65/784603/07/2013 5.4  0.0 - 96.240.0 mg/dL Final  . LDL Cholesterol 05/01/2013 103* 0 - 99 mg/dL Final  . Total CHOL/HDL Ratio 05/01/2013 3   Final                  Men          Women1/2 Average Risk     3.4          3.3Average Risk          5.0          4.42X Average Risk          9.6          7.13X Average Risk          15.0          11.0

## 2013-06-18 ENCOUNTER — Other Ambulatory Visit: Payer: Self-pay | Admitting: Endocrinology

## 2013-07-03 ENCOUNTER — Other Ambulatory Visit (INDEPENDENT_AMBULATORY_CARE_PROVIDER_SITE_OTHER): Payer: Federal, State, Local not specified - PPO

## 2013-07-03 DIAGNOSIS — IMO0001 Reserved for inherently not codable concepts without codable children: Secondary | ICD-10-CM

## 2013-07-03 DIAGNOSIS — E1165 Type 2 diabetes mellitus with hyperglycemia: Principal | ICD-10-CM

## 2013-07-03 LAB — BASIC METABOLIC PANEL WITH GFR
BUN: 18 mg/dL (ref 6–23)
CO2: 22 meq/L (ref 19–32)
Calcium: 8.9 mg/dL (ref 8.4–10.5)
Chloride: 110 meq/L (ref 96–112)
Creatinine, Ser: 1 mg/dL (ref 0.4–1.5)
GFR: 80.38 mL/min
Glucose, Bld: 200 mg/dL — ABNORMAL HIGH (ref 70–99)
Potassium: 4 meq/L (ref 3.5–5.1)
Sodium: 140 meq/L (ref 135–145)

## 2013-07-03 LAB — HEMOGLOBIN A1C: Hgb A1c MFr Bld: 7.1 % — ABNORMAL HIGH (ref 4.6–6.5)

## 2013-07-08 ENCOUNTER — Ambulatory Visit: Payer: Federal, State, Local not specified - PPO | Admitting: Endocrinology

## 2013-07-27 ENCOUNTER — Ambulatory Visit: Payer: Federal, State, Local not specified - PPO | Admitting: Endocrinology

## 2013-08-25 ENCOUNTER — Encounter: Payer: Self-pay | Admitting: Endocrinology

## 2013-08-25 ENCOUNTER — Ambulatory Visit (INDEPENDENT_AMBULATORY_CARE_PROVIDER_SITE_OTHER): Payer: Federal, State, Local not specified - PPO | Admitting: Endocrinology

## 2013-08-25 VITALS — BP 122/76 | HR 79 | Temp 98.2°F | Resp 16 | Ht 69.0 in | Wt 170.2 lb

## 2013-08-25 DIAGNOSIS — E1165 Type 2 diabetes mellitus with hyperglycemia: Principal | ICD-10-CM

## 2013-08-25 DIAGNOSIS — E7800 Pure hypercholesterolemia, unspecified: Secondary | ICD-10-CM

## 2013-08-25 DIAGNOSIS — IMO0001 Reserved for inherently not codable concepts without codable children: Secondary | ICD-10-CM

## 2013-08-25 NOTE — Progress Notes (Signed)
Patient ID: Kenneth Lawson, male   DOB: 01/09/1946, 68 y.o.   MRN: [id number]   Reason for Appointment: Diabetes follow-up    History of Present Illness    Diagnosis: date of diagnosis: 1997.   Previous history: He was previously treated with glyburide, Actos and metformin.  Metformin was stopped because he had GI side effects  His blood sugars had been relatively high in the last 3-4 years and A1c had been consistently over 7.5% until his initial consultation He was started on Invokana in 10/13 which had helped modestly. His blood sugars had shown considerable improvement with  adding  Nesina since 2/14 .    He was also on Actos which he had stopped but blood sugars did not appear to worsen. .  The previous HbgA1c levels: 6.4 in 4/14,  7.4 in 12/13, prior range from 7.8-8.3 since 2011   RECENT history:  His blood sugars are overall significantly better since his last visit This is primarily from his starting the exercise program with walking [facility] Also has cut back on carbohydrates and trying to watch his diet. He had previously lost some weight which he thinks was from depression A1c was improving as of 5/15 No hypo-glycemia with taking 5 mg glyburide  He  has been on Invokana also and is not having any candidiasis now Oral hypoglycemic drugs: Oseni, Invokana, glyburide  Side effects from medications: bloating, gas And GI distress from metformin, even 500 mg.   Monitors blood glucose: Recently 0.7 times a day  Glucometer: Accucheck. Blood Glucose readings from analysis of Accu-Chek download as follows, checking blood sugars mostly after breakfast and lunch   PREMEAL Breakfast Lunch Dinner Bedtime Overall  Glucose range:  105-168    72, 173     Mean/median:      131    POST-MEAL PC Breakfast PC Lunch PC Dinner  Glucose range:  117-215   118-183   120, 147   Mean/median:      Hypoglycemia frequency: Minimal, none recently   Meals: content is  Variable, sometimes has more portions, occasionally sweets Snacks: chips  Physical activity: exercise: He is walking the dog and Y  Dietician visit: Most recent: 1997 at diagnosis.  Retinal exam: Unknown, Has seen optometrist only.   Peripheral neuropathy: Not previously diagnosed.  Diabetic nephropathy: not present.   Wt Readings from Last 3 Encounters:  08/25/13 170 lb 3.2 oz (77.202 kg)  05/06/13 164 lb 12.8 oz (74.753 kg)  03/20/13 162 lb (73.483 kg)    Lab Results  Component Value Date   HGBA1C 7.1* 07/03/2013   HGBA1C 7.4* 03/20/2013   HGBA1C 7.2* 12/09/2012   Lab Results  Component Value Date   MICROALBUR 0.4 12/09/2012   LDLCALC 103* 05/01/2013   CREATININE 1.0 07/03/2013    No visits with results within 1 Week(s) from this visit. Latest known visit with results is:  Appointment on 07/03/2013  Component Date Value Ref Range Status  . Hemoglobin A1C 07/03/2013 7.1* 4.6 - 6.5 % Final   Glycemic Control Guidelines for People with Diabetes:Non Diabetic:  <6%Goal of Therapy: <7%Additional Action Suggested:  >8%   . Sodium 07/03/2013 140  135 - 145 mEq/L Final  . Potassium 07/03/2013 4.0  3.5 - 5.1 mEq/L Final  . Chloride 07/03/2013 110  96 - 112 mEq/L Final  . CO2 07/03/2013 22  19 - 32 mEq/L Final  . Glucose, Bld 07/03/2013 200* 70 - 99 mg/dL Final  .  BUN 07/03/2013 18  6 - 23 mg/dL Final  . Creatinine, Ser 07/03/2013 1.0  0.4 - 1.5 mg/dL Final  . Calcium 96/04/540905/09/2013 8.9  8.4 - 10.5 mg/dL Final  . GFR 81/19/147805/09/2013 80.38  >60.00 mL/min Final      Medication List       This list is accurate as of: 08/25/13 10:17 AM.  Always use your most recent med list.               ACCU-CHEK AVIVA PLUS test strip  Generic drug:  glucose blood  2 (two) times daily.     ACCU-CHEK SOFTCLIX LANCETS lancets     alfuzosin 10 MG 24 hr tablet  Commonly known as:  UROXATRAL  Take 10 mg by mouth daily.     buPROPion 300 MG 24 hr tablet  Commonly known as:  WELLBUTRIN XL  Take 300  mg by mouth daily.     Canagliflozin 300 MG Tabs  Commonly known as:  INVOKANA  Take 1 tablet (300 mg total) by mouth daily.     dutasteride 0.5 MG capsule  Commonly known as:  AVODART  Take 0.5 mg by mouth daily.     glyBURIDE 5 MG tablet  Commonly known as:  DIABETA  5 mg.     hydrocortisone-pramoxine 2.5-1 % rectal cream  Commonly known as:  ANALPRAM-HC     levofloxacin 500 MG tablet  Commonly known as:  LEVAQUIN     nystatin cream  Commonly known as:  MYCOSTATIN  Apply 1 application topically 2 (two) times daily.     omeprazole 40 MG capsule  Commonly known as:  PRILOSEC  as needed.     OSENI 25-30 MG Tabs  Generic drug:  Alogliptin-Pioglitazone  TAKE 25 MG BY MOUTH DAILY.     prednisoLONE acetate 1 % ophthalmic suspension  Commonly known as:  PRED FORTE     simvastatin 80 MG tablet  Commonly known as:  ZOCOR  Take 80 mg by mouth at bedtime.        Allergies: No Known Allergies  No past medical history on file.  Past Surgical History  Procedure Laterality Date  . Minor hemorrhoidectomy N/A     Family History  Problem Relation Age of Onset  . Cancer Neg Hx     Social History:  reports that he has quit smoking. His smoking use included Cigarettes. He smoked 0.00 packs per day. He does not have any smokeless tobacco history on file. His alcohol and drug histories are not on file.  Review of Systems:   Has had hypercholesterolemia on simvastatin 80 mg.   Lab Results  Component Value Date   CHOL 167 05/01/2013   HDL 59.10 05/01/2013   LDLCALC 103* 05/01/2013   TRIG 27.0 05/01/2013   CHOLHDL 3 05/01/2013    No history of hypertension  Has  history of BPH and prostate biopsy was negative   Examination:   BP 122/76  Pulse 79  Temp(Src) 98.2 F (36.8 C)  Resp 16  Ht 5\' 9"  (1.753 m)  Wt 170 lb 3.2 oz (77.202 kg)  BMI 25.12 kg/m2  SpO2 97%  Body mass index is 25.12 kg/(m^2).    Assesment/Plan:   Diabetes type 2 without obesity: His blood sugar  control is improving with a 4 drug regimen He is tolerating all his medications well without side effects A1c in 5/15 was 7.1 and hopefully will improve further Blood sugars average 131 at home although not as many  readings after dinner Also has started to exercise and this is helping A1c to be checked again on followup   [place]a A[place] 08/25/2013, 10:17 AM   LABS:  No visits with results within 1 Week(s) from this visit. Latest known visit with results is:  Appointment on 07/03/2013  Component Date Value Ref Range Status  . Hemoglobin A1C 07/03/2013 7.1* 4.6 - 6.5 % Final   Glycemic Control Guidelines for People with Diabetes:Non Diabetic:  <6%Goal of Therapy: <7%Additional Action Suggested:  >8%   . Sodium 07/03/2013 140  135 - 145 mEq/L Final  . Potassium 07/03/2013 4.0  3.5 - 5.1 mEq/L Final  . Chloride 07/03/2013 110  96 - 112 mEq/L Final  . CO2 07/03/2013 22  19 - 32 mEq/L Final  . Glucose, Bld 07/03/2013 200* 70 - 99 mg/dL Final  . BUN 04/54/0981 18  6 - 23 mg/dL Final  . Creatinine, Ser 07/03/2013 1.0  0.4 - 1.5 mg/dL Final  . Calcium 19/14/7829 8.9  8.4 - 10.5 mg/dL Final  . GFR 56/21/3086 80.38  >60.00 mL/min Final

## 2013-09-09 ENCOUNTER — Other Ambulatory Visit: Payer: Self-pay | Admitting: Endocrinology

## 2013-11-18 ENCOUNTER — Other Ambulatory Visit (INDEPENDENT_AMBULATORY_CARE_PROVIDER_SITE_OTHER): Payer: Federal, State, Local not specified - PPO

## 2013-11-18 DIAGNOSIS — IMO0001 Reserved for inherently not codable concepts without codable children: Secondary | ICD-10-CM

## 2013-11-18 DIAGNOSIS — E7800 Pure hypercholesterolemia, unspecified: Secondary | ICD-10-CM

## 2013-11-18 DIAGNOSIS — E1165 Type 2 diabetes mellitus with hyperglycemia: Principal | ICD-10-CM

## 2013-11-18 LAB — COMPREHENSIVE METABOLIC PANEL
ALT: 22 U/L (ref 0–53)
AST: 19 U/L (ref 0–37)
Albumin: 4.1 g/dL (ref 3.5–5.2)
Alkaline Phosphatase: 56 U/L (ref 39–117)
BUN: 24 mg/dL — ABNORMAL HIGH (ref 6–23)
CO2: 23 mEq/L (ref 19–32)
Calcium: 9 mg/dL (ref 8.4–10.5)
Chloride: 109 mEq/L (ref 96–112)
Creatinine, Ser: 1.2 mg/dL (ref 0.4–1.5)
GFR: 66.87 mL/min (ref >60.00)
Glucose, Bld: 197 mg/dL — ABNORMAL HIGH (ref 70–99)
Potassium: 4.1 mEq/L (ref 3.5–5.1)
Sodium: 138 mEq/L (ref 135–145)
Total Bilirubin: 0.6 mg/dL (ref 0.2–1.2)
Total Protein: 7.3 g/dL (ref 6.0–8.3)

## 2013-11-18 LAB — LIPID PANEL
Cholesterol: 197 mg/dL (ref 0–200)
HDL: 56.4 mg/dL (ref >39.00)
LDL Cholesterol: 128 mg/dL — ABNORMAL HIGH (ref 0–99)
NonHDL: 140.6
Total CHOL/HDL Ratio: 3
Triglycerides: 64 mg/dL (ref 0.0–149.0)
VLDL: 12.8 mg/dL (ref 0.0–40.0)

## 2013-11-18 LAB — HEMOGLOBIN A1C: Hgb A1c MFr Bld: 6.9 % — ABNORMAL HIGH (ref 4.6–6.5)

## 2013-11-18 LAB — MICROALBUMIN / CREATININE URINE RATIO
Creatinine,U: 103.5 mg/dL
Microalb Creat Ratio: 0.2 mg/g (ref 0.0–30.0)
Microalb, Ur: 0.2 mg/dL (ref 0.0–1.9)

## 2013-11-18 LAB — LDL CHOLESTEROL, DIRECT: Direct LDL: 134.6 mg/dL

## 2013-11-25 ENCOUNTER — Ambulatory Visit (INDEPENDENT_AMBULATORY_CARE_PROVIDER_SITE_OTHER): Payer: Federal, State, Local not specified - PPO | Admitting: Endocrinology

## 2013-11-25 ENCOUNTER — Encounter: Payer: Self-pay | Admitting: Endocrinology

## 2013-11-25 VITALS — BP 130/73 | HR 84 | Temp 98.1°F | Resp 14 | Ht 69.0 in | Wt 168.2 lb

## 2013-11-25 DIAGNOSIS — Z23 Encounter for immunization: Secondary | ICD-10-CM

## 2013-11-25 DIAGNOSIS — E1165 Type 2 diabetes mellitus with hyperglycemia: Principal | ICD-10-CM

## 2013-11-25 DIAGNOSIS — IMO0001 Reserved for inherently not codable concepts without codable children: Secondary | ICD-10-CM

## 2013-11-25 DIAGNOSIS — E7800 Pure hypercholesterolemia, unspecified: Secondary | ICD-10-CM

## 2013-11-25 MED ORDER — ROSUVASTATIN CALCIUM 20 MG PO TABS: 40.0000 mg | ORAL_TABLET | Freq: Every day | ORAL | Status: DC

## 2013-11-25 NOTE — Progress Notes (Signed)
Patient ID: Kenneth Lawson, male   DOB: 1945-12-19, 68 y.o.   MRN: [id number]   Reason for Appointment: Diabetes follow-up    History of Present Illness    Diagnosis: date of diagnosis: 1997.   Previous history: He was previously treated with glyburide, Actos and metformin.  Metformin was stopped because he had GI side effects  His blood sugars had been relatively high in the last 3-4 years and A1c had been consistently over 7.5% until his initial consultation He was started on Invokana in 10/13 which had helped modestly. His blood sugars had shown considerable improvement with  adding  Nesina since 2/14 .    He was also on Actos which he had stopped but blood sugars did not appear to worsen. .  The previous HbgA1c levels: 6.4 in 4/14,  7.4 in 12/13, prior range from 7.8-8.3 since 2011   RECENT history:  His blood sugars are overall fair with A1c under 7% but apparently glucose readings are inconsistent at home He thinks he had high readings for about a week partly from his traveling; only once has seen a reading as high as 210 However his blood sugar in the lab was 197, not clear if this was postprandial His glucose monitor is probably 66 years old Overall control is probably fairly good since he is trying to walk regularly although has not gone to the gym as before. He is mostly trying to watch his diet; tends to go off the diet to if he has issues with depression No hypoglycemia with taking 5 mg glyburide  He  has been on Invokana also and is not having any candidiasis with this  Oral hypoglycemic drugs: Oseni, Invokana, glyburide  Side effects from medications: bloating, gas And GI distress from metformin, even 500 mg.   Monitors blood glucose: Recently less regularly  Glucometer: Accucheck. Blood Glucose readings from recall  PC 118-133 and fasting as low as 90  Hypoglycemia frequency: Minimal, none recently   Meals: content is Variable, sometimes off diet, may be getting more  dairy products at times Physical activity: exercise: He is walking outside mostly come up to 2 miles a day  Dietician visit: Most recent: 1997 at diagnosis.  Retinal exam: 07/24/13, history of mild nonproliferative retinopathy OD  Peripheral neuropathy: Not previously diagnosed.  Diabetic nephropathy: not present.   Wt Readings from Last 3 Encounters:  11/25/13 168 lb 3.2 oz (76.295 kg)  08/25/13 170 lb 3.2 oz (77.202 kg)  05/06/13 164 lb 12.8 oz (74.753 kg)    Lab Results  Component Value Date   HGBA1C 6.9* 11/18/2013   HGBA1C 7.1* 07/03/2013   HGBA1C 7.4* 03/20/2013   Lab Results  Component Value Date   MICROALBUR 0.2 11/18/2013   LDLCALC 128* 11/18/2013   CREATININE 1.2 11/18/2013    No visits with results within 1 Week(s) from this visit. Latest known visit with results is:  Appointment on 11/18/2013  Component Date Value Ref Range Status  . Hemoglobin A1C 11/18/2013 6.9* 4.6 - 6.5 % Final   Glycemic Control Guidelines for People with Diabetes:Non Diabetic:  <6%Goal of Therapy: <7%Additional Action Suggested:  >8%   . Sodium 11/18/2013 138  135 - 145 mEq/L Final  . Potassium 11/18/2013 4.1  3.5 - 5.1 mEq/L Final  . Chloride 11/18/2013 109  96 - 112 mEq/L Final  . CO2 11/18/2013 23  19 - 32 mEq/L Final  . Glucose, Bld 11/18/2013 197* 70 - 99 mg/dL Final  . BUN 40/98/1191 24*  6 - 23 mg/dL Final  . Creatinine, Ser 11/18/2013 1.2  0.4 - 1.5 mg/dL Final  . Total Bilirubin 11/18/2013 0.6  0.2 - 1.2 mg/dL Final  . Alkaline Phosphatase 11/18/2013 56  39 - 117 U/L Final  . AST 11/18/2013 19  0 - 37 U/L Final  . ALT 11/18/2013 22  0 - 53 U/L Final  . Total Protein 11/18/2013 7.3  6.0 - 8.3 g/dL Final  . Albumin 16/11/9602 4.1  3.5 - 5.2 g/dL Final  . Calcium 54/10/8117 9.0  8.4 - 10.5 mg/dL Final  . GFR 14/78/2956 66.87  >60.00 mL/min Final  . Cholesterol 11/18/2013 197  0 - 200 mg/dL Final   ATP III Classification       Desirable:  < 200 mg/dL               Borderline High:  200  - 239 mg/dL          High:  > = 213 mg/dL  . Triglycerides 11/18/2013 64.0  0.0 - 149.0 mg/dL Final   Normal:  <086 mg/dLBorderline High:  150 - 199 mg/dL  . HDL 11/18/2013 56.40  >39.00 mg/dL Final  . VLDL 57/84/6962 12.8  0.0 - 40.0 mg/dL Final  . LDL Cholesterol 11/18/2013 128* 0 - 99 mg/dL Final  . Total CHOL/HDL Ratio 11/18/2013 3   Final                  Men          Women1/2 Average Risk     3.4          3.3Average Risk          5.0          4.42X Average Risk          9.6          7.13X Average Risk          15.0          11.0                      . NonHDL 11/18/2013 140.60   Final   NOTE:  Non-HDL goal should be 30 mg/dL higher than patient's LDL goal (i.e. LDL goal of < 70 mg/dL, would have non-HDL goal of < 100 mg/dL)  . Direct LDL 11/18/2013 134.6   Final   Optimal:  <100 mg/dLNear or Above Optimal:  100-129 mg/dLBorderline High:  130-159 mg/dLHigh:  160-189 mg/dLVery High:  >190 mg/dL  . Microalb, Ur 11/18/2013 0.2  0.0 - 1.9 mg/dL Final  . Creatinine,U 95/28/4132 103.5   Final  . Microalb Creat Ratio 11/18/2013 0.2  0.0 - 30.0 mg/g Final      Medication List       This list is accurate as of: 11/25/13  9:58 AM.  Always use your most recent med list.               ACCU-CHEK AVIVA PLUS test strip  Generic drug:  glucose blood  2 (two) times daily.     ACCU-CHEK SOFTCLIX LANCETS lancets     alfuzosin 10 MG 24 hr tablet  Commonly known as:  UROXATRAL  Take 10 mg by mouth daily.     ALPRAZolam 0.5 MG tablet  Commonly known as:  XANAX     buPROPion 300 MG 24 hr tablet  Commonly known as:  WELLBUTRIN XL  Take 300 mg by mouth daily.  dutasteride 0.5 MG capsule  Commonly known as:  AVODART  Take 0.5 mg by mouth daily.     glyBURIDE 5 MG tablet  Commonly known as:  DIABETA  5 mg.     hydrocortisone-pramoxine 2.5-1 % rectal cream  Commonly known as:  ANALPRAM-HC     INVOKANA 300 MG Tabs  Generic drug:  Canagliflozin  TAKE 1 TABLET (300 MG TOTAL) BY MOUTH  DAILY.     levofloxacin 500 MG tablet  Commonly known as:  LEVAQUIN     nystatin cream  Commonly known as:  MYCOSTATIN  Apply 1 application topically 2 (two) times daily.     omeprazole 40 MG capsule  Commonly known as:  PRILOSEC  as needed.     OSENI 25-30 MG Tabs  Generic drug:  Alogliptin-Pioglitazone  TAKE 25 MG BY MOUTH DAILY.     prednisoLONE acetate 1 % ophthalmic suspension  Commonly known as:  PRED FORTE     simvastatin 80 MG tablet  Commonly known as:  ZOCOR  Take 80 mg by mouth at bedtime.        Allergies: No Known Allergies  No past medical history on file.  Past Surgical History  Procedure Laterality Date  . Minor hemorrhoidectomy N/A     Family History  Problem Relation Age of Onset  . Cancer Neg Hx     Social History:  reports that he has quit smoking. His smoking use included Cigarettes. He smoked 0.00 packs per day. He does not have any smokeless tobacco history on file. His alcohol and drug histories are not on file.  Review of Systems:   Has had hypercholesterolemia, is on simvastatin 80 mg.  He says he is very compliant with the medication and is in good with fat intake but LDL is significantly higher especially direct LDL Has not discussed this with his PCP recently  Lab Results  Component Value Date   CHOL 197 11/18/2013   HDL 56.40 11/18/2013   LDLCALC 128* 11/18/2013   LDLDIRECT 134.6 11/18/2013   TRIG 64.0 11/18/2013   CHOLHDL 3 11/18/2013    No history of hypertension and blood pressure is still excellent  Has  history of BPH    Examination:   BP 130/73  Pulse 84  Temp(Src) 98.1 F (36.7 C)  Resp 14  Ht 5\' 9"  (1.753 m)  Wt 168 lb 3.2 oz (76.295 kg)  BMI 24.83 kg/m2  SpO2 96%  Body mass index is 24.83 kg/(m^2).   Diabetic foot exam shows normal monofilament sensation in the toes and plantar surfaces, no skin lesions or ulcers on the feet and normal pedal pulses  Assesment/Plan:   Diabetes type 2 with BMI 25  His blood  sugar control is fairly good overall with a 4 drug regimen and A1c now under 7 Weight has improved slightly and he is generally doing well with walking program Has occasional postprandial hyperglycemia based on his diet including on the lab work He is tolerating all his medications well without side effects He does think that he can exercise more regularly at the gym Also needs to check more postprandial blood sugars which he is probably not doing Will give him a new Accu-Chek meter in case his previous one is inaccurate  HYPERCHOLESTEROLEMIA:  LDL is significantly off from his previous levels and will change him to Crestor instead of simvastatin Co-pay card given for his 90 day prescription Discussed saturated fat intake and given him brochure on hypercholesterolemia and medications  Counseling time over 50% of today's 25 minute visit   Kynzlie Hilleary 11/25/2013, 9:58 AM   LABS:  No visits with results within 1 Week(s) from this visit. Latest known visit with results is:  Appointment on 11/18/2013  Component Date Value Ref Range Status  . Hemoglobin A1C 11/18/2013 6.9* 4.6 - 6.5 % Final   Glycemic Control Guidelines for People with Diabetes:Non Diabetic:  <6%Goal of Therapy: <7%Additional Action Suggested:  >8%   . Sodium 11/18/2013 138  135 - 145 mEq/L Final  . Potassium 11/18/2013 4.1  3.5 - 5.1 mEq/L Final  . Chloride 11/18/2013 109  96 - 112 mEq/L Final  . CO2 11/18/2013 23  19 - 32 mEq/L Final  . Glucose, Bld 11/18/2013 197* 70 - 99 mg/dL Final  . BUN 40/98/119109/23/2015 24* 6 - 23 mg/dL Final  . Creatinine, Ser 11/18/2013 1.2  0.4 - 1.5 mg/dL Final  . Total Bilirubin 11/18/2013 0.6  0.2 - 1.2 mg/dL Final  . Alkaline Phosphatase 11/18/2013 56  39 - 117 U/L Final  . AST 11/18/2013 19  0 - 37 U/L Final  . ALT 11/18/2013 22  0 - 53 U/L Final  . Total Protein 11/18/2013 7.3  6.0 - 8.3 g/dL Final  . Albumin 47/82/956209/23/2015 4.1  3.5 - 5.2 g/dL Final  . Calcium 13/08/657809/23/2015 9.0  8.4 - 10.5 mg/dL  Final  . GFR 46/96/295209/23/2015 66.87  >60.00 mL/min Final  . Cholesterol 11/18/2013 197  0 - 200 mg/dL Final   ATP III Classification       Desirable:  < 200 mg/dL               Borderline High:  200 - 239 mg/dL          High:  > = 841240 mg/dL  . Triglycerides 11/18/2013 64.0  0.0 - 149.0 mg/dL Final   Normal:  <324<150 mg/dLBorderline High:  150 - 199 mg/dL  . HDL 11/18/2013 56.40  >39.00 mg/dL Final  . VLDL 40/10/272509/23/2015 12.8  0.0 - 40.0 mg/dL Final  . LDL Cholesterol 11/18/2013 128* 0 - 99 mg/dL Final  . Total CHOL/HDL Ratio 11/18/2013 3   Final                  Men          Women1/2 Average Risk     3.4          3.3Average Risk          5.0          4.42X Average Risk          9.6          7.13X Average Risk          15.0          11.0                      . NonHDL 11/18/2013 140.60   Final   NOTE:  Non-HDL goal should be 30 mg/dL higher than patient's LDL goal (i.e. LDL goal of < 70 mg/dL, would have non-HDL goal of < 100 mg/dL)  . Direct LDL 11/18/2013 134.6   Final   Optimal:  <100 mg/dLNear or Above Optimal:  100-129 mg/dLBorderline High:  130-159 mg/dLHigh:  160-189 mg/dLVery High:  >190 mg/dL  . Microalb, Ur 11/18/2013 0.2  0.0 - 1.9 mg/dL Final  . Creatinine,U 36/64/403409/23/2015 103.5   Final  . Microalb Creat Ratio 11/18/2013 0.2  0.0 -  30.0 mg/g Final

## 2013-11-25 NOTE — Patient Instructions (Signed)
Change Simvastatin  Please check blood sugars at least half the time about 2 hours after any meal and times per week on waking up. Please bring blood sugar monitor to each visit

## 2013-12-17 ENCOUNTER — Other Ambulatory Visit: Payer: Self-pay | Admitting: Endocrinology

## 2014-02-16 ENCOUNTER — Other Ambulatory Visit (INDEPENDENT_AMBULATORY_CARE_PROVIDER_SITE_OTHER): Payer: Federal, State, Local not specified - PPO

## 2014-02-16 DIAGNOSIS — IMO0002 Reserved for concepts with insufficient information to code with codable children: Secondary | ICD-10-CM

## 2014-02-16 DIAGNOSIS — E1165 Type 2 diabetes mellitus with hyperglycemia: Secondary | ICD-10-CM

## 2014-02-16 LAB — COMPREHENSIVE METABOLIC PANEL
ALT: 26 U/L (ref 0–53)
AST: 19 U/L (ref 0–37)
Albumin: 4 g/dL (ref 3.5–5.2)
Alkaline Phosphatase: 55 U/L (ref 39–117)
BUN: 19 mg/dL (ref 6–23)
CO2: 21 mEq/L (ref 19–32)
Calcium: 8.8 mg/dL (ref 8.4–10.5)
Chloride: 109 mEq/L (ref 96–112)
Creatinine, Ser: 0.9 mg/dL (ref 0.4–1.5)
GFR: 85.17 mL/min (ref >60.00)
Glucose, Bld: 186 mg/dL — ABNORMAL HIGH (ref 70–99)
Potassium: 3.8 mEq/L (ref 3.5–5.1)
Sodium: 137 mEq/L (ref 135–145)
Total Bilirubin: 0.6 mg/dL (ref 0.2–1.2)
Total Protein: 6.8 g/dL (ref 6.0–8.3)

## 2014-02-16 LAB — LIPID PANEL
Cholesterol: 181 mg/dL (ref 0–200)
HDL: 46.7 mg/dL (ref >39.00)
LDL Cholesterol: 110 mg/dL — ABNORMAL HIGH (ref 0–99)
NonHDL: 134.3
Total CHOL/HDL Ratio: 4
Triglycerides: 121 mg/dL (ref 0.0–149.0)
VLDL: 24.2 mg/dL (ref 0.0–40.0)

## 2014-02-16 LAB — HEMOGLOBIN A1C: Hgb A1c MFr Bld: 6.9 % — ABNORMAL HIGH (ref 4.6–6.5)

## 2014-02-24 ENCOUNTER — Ambulatory Visit (INDEPENDENT_AMBULATORY_CARE_PROVIDER_SITE_OTHER): Payer: Federal, State, Local not specified - PPO | Admitting: Endocrinology

## 2014-02-24 ENCOUNTER — Encounter: Payer: Self-pay | Admitting: Endocrinology

## 2014-02-24 VITALS — BP 138/74 | HR 81 | Temp 98.5°F | Resp 14 | Ht 68.0 in | Wt 173.6 lb

## 2014-02-24 DIAGNOSIS — E7800 Pure hypercholesterolemia, unspecified: Secondary | ICD-10-CM

## 2014-02-24 DIAGNOSIS — E119 Type 2 diabetes mellitus without complications: Secondary | ICD-10-CM

## 2014-02-24 MED ORDER — ATORVASTATIN CALCIUM 80 MG PO TABS: 80.0000 mg | ORAL_TABLET | Freq: Every day | ORAL | Status: DC

## 2014-02-24 NOTE — Patient Instructions (Signed)
Please check blood sugars at least half the time about 2 hours after any meal and 2 times per week on waking up. Please bring blood sugar monitor to each visit  

## 2014-02-24 NOTE — Progress Notes (Signed)
Patient ID: Kenneth Lawson, male   DOB: 01-31-1946, 68 y.o.   MRN: [id number]   Reason for Appointment: Diabetes follow-up    History of Present Illness    Diagnosis: date of diagnosis: 1997.   Previous history: He was previously treated with glyburide, Actos and metformin.  Metformin was stopped because he had GI side effects  His blood sugars had been relatively high in the last 3-4 years and A1c had been consistently over 7.5% until his initial consultation He was started on Invokana in 10/13 which had helped modestly. His blood sugars had shown considerable improvement with  adding  Nesina since 2/14 .    He was also on Actos which he had stopped but blood sugars did not appear to worsen. .  The previous HbgA1c levels: 6.4 in 4/14,  7.4 in 12/13, prior range from 7.8-8.3 since 2011   RECENT history:  His blood sugars are overall fair with A1c under 7% but he has occasional high readings especially after breakfast However he has checked his blood sugars very sporadically not only 4 times this month He tends to have higher readings after his morning meal even if he has some protein Also has gained weight probably from inconsistent diet He is however trying to be regular with walking almost daily No hypoglycemia with taking 5 mg glyburide  He  has been on Invokana 300 mg now and is not having any candidiasis lately  Oral hypoglycemic drugs: Oseni, Invokana 300mg , glyburide  Side effects from medications: bloating, gas and GI distress from metformin, even 500 mg.   Monitors blood glucose: Sporadically  Glucometer: Accucheck. Blood Glucose readings from monitor revealed:  A.m.: 114 197 at 10 am, 168, 69 at 4p,  Hypoglycemia frequency: Minimal, none recently   Meals: content is Variable, sometimes off diet, may be getting more dairy products at times; am cheese/ham and toast Physical activity: exercise: He is walking outside mostly, Average 2 miles a day  Dietician visit: Most  recent: 1997 at diagnosis.  Retinal exam: 07/24/13, history of mild nonproliferative retinopathy OD  Peripheral neuropathy: Not previously diagnosed.  Diabetic nephropathy: not present.   Wt Readings from Last 3 Encounters:  02/24/14 173 lb 9.6 oz (78.744 kg)  11/25/13 168 lb 3.2 oz (76.295 kg)  08/25/13 170 lb 3.2 oz (77.202 kg)    Lab Results  Component Value Date   HGBA1C 6.9* 02/16/2014   HGBA1C 6.9* 11/18/2013   HGBA1C 7.1* 07/03/2013   Lab Results  Component Value Date   MICROALBUR 0.2 11/18/2013   LDLCALC 110* 02/16/2014   CREATININE 0.9 02/16/2014    No visits with results within 1 Week(s) from this visit. Latest known visit with results is:  Lab on 02/16/2014  Component Date Value Ref Range Status  . Hgb A1c MFr Bld 02/16/2014 6.9* 4.6 - 6.5 % Final   Glycemic Control Guidelines for People with Diabetes:Non Diabetic:  <6%Goal of Therapy: <7%Additional Action Suggested:  >8%   . Sodium 02/16/2014 137  135 - 145 mEq/L Final  . Potassium 02/16/2014 3.8  3.5 - 5.1 mEq/L Final  . Chloride 02/16/2014 109  96 - 112 mEq/L Final  . CO2 02/16/2014 21  19 - 32 mEq/L Final  . Glucose, Bld 02/16/2014 186* 70 - 99 mg/dL Final  . BUN 40/98/1191 19  6 - 23 mg/dL Final  . Creatinine, Ser 02/16/2014 0.9  0.4 - 1.5 mg/dL Final  . Total Bilirubin 02/16/2014 0.6  0.2 - 1.2 mg/dL Final  .  Alkaline Phosphatase 02/16/2014 55  39 - 117 U/L Final  . AST 02/16/2014 19  0 - 37 U/L Final  . ALT 02/16/2014 26  0 - 53 U/L Final  . Total Protein 02/16/2014 6.8  6.0 - 8.3 g/dL Final  . Albumin 16/11/9602 4.0  3.5 - 5.2 g/dL Final  . Calcium 54/10/8117 8.8  8.4 - 10.5 mg/dL Final  . GFR 14/78/2956 85.17  >60.00 mL/min Final  . Cholesterol 02/16/2014 181  0 - 200 mg/dL Final   ATP III Classification       Desirable:  < 200 mg/dL               Borderline High:  200 - 239 mg/dL          High:  > = 213 mg/dL  . Triglycerides 02/16/2014 121.0  0.0 - 149.0 mg/dL Final   Normal:  <086  mg/dLBorderline High:  150 - 199 mg/dL  . HDL 02/16/2014 46.70  >39.00 mg/dL Final  . VLDL 57/84/6962 24.2  0.0 - 40.0 mg/dL Final  . LDL Cholesterol 02/16/2014 110* 0 - 99 mg/dL Final  . Total CHOL/HDL Ratio 02/16/2014 4   Final                  Men          Women1/2 Average Risk     3.4          3.3Average Risk          5.0          4.42X Average Risk          9.6          7.13X Average Risk          15.0          11.0                      . NonHDL 02/16/2014 134.30   Final   NOTE:  Non-HDL goal should be 30 mg/dL higher than patient's LDL goal (i.e. LDL goal of < 70 mg/dL, would have non-HDL goal of < 100 mg/dL)      Medication List       This list is accurate as of: 02/24/14 10:25 AM.  Always use your most recent med list.               ACCU-CHEK AVIVA PLUS test strip  Generic drug:  glucose blood  2 (two) times daily.     ACCU-CHEK SOFTCLIX LANCETS lancets     alfuzosin 10 MG 24 hr tablet  Commonly known as:  UROXATRAL  Take 10 mg by mouth daily.     ALPRAZolam 0.5 MG tablet  Commonly known as:  XANAX     buPROPion 300 MG 24 hr tablet  Commonly known as:  WELLBUTRIN XL  Take 300 mg by mouth daily.     finasteride 5 MG tablet  Commonly known as:  PROSCAR     glyBURIDE 5 MG tablet  Commonly known as:  DIABETA  5 mg.     hydrocortisone-pramoxine 2.5-1 % rectal cream  Commonly known as:  ANALPRAM-HC     INVOKANA 300 MG Tabs tablet  Generic drug:  canagliflozin  TAKE 1 TABLET (300 MG TOTAL) BY MOUTH DAILY.     nystatin cream  Commonly known as:  MYCOSTATIN  Apply 1 application topically 2 (two) times daily.     omeprazole 40 MG capsule  Commonly known as:  PRILOSEC  as needed.     OSENI 25-30 MG Tabs  Generic drug:  Alogliptin-Pioglitazone  TAKE 25 MG BY MOUTH DAILY.     rosuvastatin 20 MG tablet  Commonly known as:  CRESTOR  Take 2 tablets (40 mg total) by mouth daily.     simvastatin 80 MG tablet  Commonly known as:  ZOCOR     tetrahydrozoline  0.05 % ophthalmic solution  1 drop.        Allergies: No Known Allergies  No past medical history on file.  Past Surgical History  Procedure Laterality Date  . Minor hemorrhoidectomy N/A     Family History  Problem Relation Age of Onset  . Cancer Neg Hx     Social History:  reports that he has quit smoking. His smoking use included Cigarettes. He smoked 0.00 packs per day. He does not have any smokeless tobacco history on file. His alcohol and drug histories are not on file.  Review of Systems:   Has had hypercholesterolemia, is still on simvastatin 80 mg because his insurance would not cover Crestor as directed.  He says he is  compliant with the medication and generally watching his fat intake but LDL is still somewhat high Nutritional information was given to him in 9/15 HDL is not low and triglycerides normal  Lab Results  Component Value Date   CHOL 181 02/16/2014   HDL 46.70 02/16/2014   LDLCALC 110* 02/16/2014   LDLDIRECT 134.6 11/18/2013   TRIG 121.0 02/16/2014   CHOLHDL 4 02/16/2014    No history of hypertension and has white coat blood pressure increase sometimes Has  history of BPH   Diabetic foot exam in 9/15 showed normal monofilament sensation in the toes and plantar surfaces, no skin lesions or ulcers on the feet and normal pedal pulses    Examination:   BP 156/90 mmHg  Pulse 81  Temp(Src) 98.5 F (36.9 C)  Resp 14  Ht 5\' 8"  (1.727 m)  Wt 173 lb 9.6 oz (78.744 kg)  BMI 26.40 kg/m2  SpO2 98%  Body mass index is 26.4 kg/(m^2).   Repeat blood pressure 138/74  Assesment/Plan:   Diabetes type 2 with BMI 25  His blood sugar control is fairly good overall with a 4 drug regimen and A1c stable under 7 Weight has gone up probably because of the seasonal change in diet He is generally doing well with walking program Has occasional postprandial hyperglycemia based on his diet especially after breakfast even though this appears to be fairly good He  is tolerating all his medications well without side effects He does need to check blood sugars more consistently  HYPERCHOLESTEROLEMIA:  LDL is still higher compared to  his previous levels and will change him to Lipitor    Kenneth Lawson 02/24/2014, 10:25 AM   LABS:  No visits with results within 1 Week(s) from this visit. Latest known visit with results is:  Lab on 02/16/2014  Component Date Value Ref Range Status  . Hgb A1c MFr Bld 02/16/2014 6.9* 4.6 - 6.5 % Final   Glycemic Control Guidelines for People with Diabetes:Non Diabetic:  <6%Goal of Therapy: <7%Additional Action Suggested:  >8%   . Sodium 02/16/2014 137  135 - 145 mEq/L Final  . Potassium 02/16/2014 3.8  3.5 - 5.1 mEq/L Final  . Chloride 02/16/2014 109  96 - 112 mEq/L Final  . CO2 02/16/2014 21  19 - 32 mEq/L Final  . Glucose, Bld 02/16/2014 186*  70 - 99 mg/dL Final  . BUN 16/10/960412/22/2015 19  6 - 23 mg/dL Final  . Creatinine, Ser 02/16/2014 0.9  0.4 - 1.5 mg/dL Final  . Total Bilirubin 02/16/2014 0.6  0.2 - 1.2 mg/dL Final  . Alkaline Phosphatase 02/16/2014 55  39 - 117 U/L Final  . AST 02/16/2014 19  0 - 37 U/L Final  . ALT 02/16/2014 26  0 - 53 U/L Final  . Total Protein 02/16/2014 6.8  6.0 - 8.3 g/dL Final  . Albumin 54/09/811912/22/2015 4.0  3.5 - 5.2 g/dL Final  . Calcium 14/78/295612/22/2015 8.8  8.4 - 10.5 mg/dL Final  . GFR 21/30/865712/22/2015 85.17  >60.00 mL/min Final  . Cholesterol 02/16/2014 181  0 - 200 mg/dL Final   ATP III Classification       Desirable:  < 200 mg/dL               Borderline High:  200 - 239 mg/dL          High:  > = 846240 mg/dL  . Triglycerides 02/16/2014 121.0  0.0 - 149.0 mg/dL Final   Normal:  <962<150 mg/dLBorderline High:  150 - 199 mg/dL  . HDL 02/16/2014 46.70  >39.00 mg/dL Final  . VLDL 95/28/413212/22/2015 24.2  0.0 - 40.0 mg/dL Final  . LDL Cholesterol 02/16/2014 110* 0 - 99 mg/dL Final  . Total CHOL/HDL Ratio 02/16/2014 4   Final                  Men          Women1/2 Average Risk     3.4          3.3Average Risk           5.0          4.42X Average Risk          9.6          7.13X Average Risk          15.0          11.0                      . NonHDL 02/16/2014 134.30   Final   NOTE:  Non-HDL goal should be 30 mg/dL higher than patient's LDL goal (i.e. LDL goal of < 70 mg/dL, would have non-HDL goal of < 100 mg/dL)

## 2014-03-18 ENCOUNTER — Other Ambulatory Visit: Payer: Self-pay | Admitting: Endocrinology

## 2014-03-25 ENCOUNTER — Other Ambulatory Visit: Payer: Self-pay | Admitting: Endocrinology

## 2014-05-20 ENCOUNTER — Other Ambulatory Visit (INDEPENDENT_AMBULATORY_CARE_PROVIDER_SITE_OTHER): Payer: Federal, State, Local not specified - PPO

## 2014-05-20 DIAGNOSIS — E7800 Pure hypercholesterolemia, unspecified: Secondary | ICD-10-CM

## 2014-05-20 DIAGNOSIS — E119 Type 2 diabetes mellitus without complications: Secondary | ICD-10-CM

## 2014-05-20 LAB — COMPREHENSIVE METABOLIC PANEL
ALT: 21 U/L (ref 0–53)
AST: 16 U/L (ref 0–37)
Albumin: 3.9 g/dL (ref 3.5–5.2)
Alkaline Phosphatase: 72 U/L (ref 39–117)
BUN: 19 mg/dL (ref 6–23)
CO2: 24 mEq/L (ref 19–32)
Calcium: 9 mg/dL (ref 8.4–10.5)
Chloride: 109 mEq/L (ref 96–112)
Creatinine, Ser: 1.08 mg/dL (ref 0.40–1.50)
GFR: 72.5 mL/min (ref >60.00)
Glucose, Bld: 201 mg/dL — ABNORMAL HIGH (ref 70–99)
Potassium: 4.1 mEq/L (ref 3.5–5.1)
Sodium: 138 mEq/L (ref 135–145)
Total Bilirubin: 0.6 mg/dL (ref 0.2–1.2)
Total Protein: 6.8 g/dL (ref 6.0–8.3)

## 2014-05-20 LAB — MICROALBUMIN / CREATININE URINE RATIO
Creatinine,U: 65.5 mg/dL
Microalb Creat Ratio: 1.1 mg/g (ref 0.0–30.0)
Microalb, Ur: <0.7 mg/dL (ref 0.0–1.9)

## 2014-05-20 LAB — URINALYSIS, ROUTINE W REFLEX MICROSCOPIC
Bilirubin Urine: NEGATIVE
Hgb urine dipstick: NEGATIVE
Ketones, ur: NEGATIVE
Leukocytes, UA: NEGATIVE
Nitrite: NEGATIVE
RBC / HPF: NONE SEEN (ref 0–?)
Specific Gravity, Urine: 1.02 (ref 1.000–1.030)
Total Protein, Urine: NEGATIVE
Urine Glucose: >=1000 — AB
Urobilinogen, UA: 0.2 (ref 0.0–1.0)
pH: 5 (ref 5.0–8.0)

## 2014-05-20 LAB — LIPID PANEL
Cholesterol: 155 mg/dL (ref 0–200)
HDL: 48.2 mg/dL (ref >39.00)
LDL Cholesterol: 85 mg/dL (ref 0–99)
NonHDL: 106.8
Total CHOL/HDL Ratio: 3
Triglycerides: 111 mg/dL (ref 0.0–149.0)
VLDL: 22.2 mg/dL (ref 0.0–40.0)

## 2014-05-20 LAB — HEMOGLOBIN A1C: Hgb A1c MFr Bld: 7 % — ABNORMAL HIGH (ref 4.6–6.5)

## 2014-05-25 ENCOUNTER — Ambulatory Visit (INDEPENDENT_AMBULATORY_CARE_PROVIDER_SITE_OTHER): Payer: Federal, State, Local not specified - PPO | Admitting: Endocrinology

## 2014-05-25 ENCOUNTER — Encounter: Payer: Self-pay | Admitting: Endocrinology

## 2014-05-25 VITALS — BP 142/70 | HR 98 | Temp 98.1°F | Resp 14 | Ht 68.0 in | Wt 166.8 lb

## 2014-05-25 DIAGNOSIS — E7800 Pure hypercholesterolemia, unspecified: Secondary | ICD-10-CM

## 2014-05-25 DIAGNOSIS — E119 Type 2 diabetes mellitus without complications: Secondary | ICD-10-CM | POA: Diagnosis not present

## 2014-05-25 NOTE — Progress Notes (Signed)
Patient ID: Kenneth Lawson, male   DOB: 06-Aug-1945, 69 y.o.   MRN: [id number]   Reason for Appointment: Diabetes follow-up    History of Present Illness    Diagnosis: date of diagnosis: 1997.   Previous history: He was previously treated with glyburide, Actos and metformin.  Metformin was stopped because he had GI side effects  His blood sugars had been relatively high in the last 3-4 years and A1c had been consistently over 7.5% until his initial consultation He was started on Invokana in 10/13 which had helped modestly. His blood sugars had shown considerable improvement with  adding  Nesina since 2/14 .    He was also on Actos which he had stopped but blood sugars did not appear to worsen. .  The previous HbgA1c levels: 6.4 in 4/14,  7.4 in 12/13, prior range from 7.8-8.3 since 2011   RECENT history:  His blood sugars are overall fair with A1c  7% but he has inconsistent control However he has checked his blood sugars very sporadically and mostly in the afternoon, total 10 times this month He tends to have higher readings after his lunch and not clear why. He thinks his diet has been followed only about two thirds of the time Also has been walking and finally able to lose some weight. However he still has issues with stress and has been traveling some also No hypoglycemia with taking 5 mg glyburide twice a day  He  has been on Invokana 300 mg consistently and is not having any candidiasis lately  Oral hypoglycemic drugs: Oseni, Invokana , glyburide bid  Side effects from medications: bloating, gas and GI distress from metformin, even 500 mg.   Monitors blood glucose: Sporadically  Glucometer: Accucheck. Blood Glucose readings from monitor revealed:   PRE-MEAL Breakfast Lunch Dinner Bedtime Overall  Glucose range:  96-219    128-211   70    Mean/median:      154    Hypoglycemia frequency: Minimal, none recently   Meals: content is Variable, sometimes off diet 2/3, may be  getting more dairy products at times; am cheese/ham and toast Physical activity: exercise: He is walking outside mostly, Average 2 miles a day  Dietician visit: Most recent: 1997 at diagnosis.  Retinal exam: 07/24/13, history of mild nonproliferative retinopathy OD  Peripheral neuropathy: Not previously diagnosed.  Diabetic nephropathy: not present.   Wt Readings from Last 3 Encounters:  05/25/14 166 lb 12.8 oz (75.66 kg)  02/24/14 173 lb 9.6 oz (78.744 kg)  11/25/13 168 lb 3.2 oz (76.295 kg)    Lab Results  Component Value Date   HGBA1C 7.0* 05/20/2014   HGBA1C 6.9* 02/16/2014   HGBA1C 6.9* 11/18/2013   Lab Results  Component Value Date   MICROALBUR <0.7 05/20/2014   LDLCALC 85 05/20/2014   CREATININE 1.08 05/20/2014    Lab on 05/20/2014  Component Date Value Ref Range Status  . Hgb A1c MFr Bld 05/20/2014 7.0* 4.6 - 6.5 % Final   Glycemic Control Guidelines for People with Diabetes:Non Diabetic:  <6%Goal of Therapy: <7%Additional Action Suggested:  >8%   . Sodium 05/20/2014 138  135 - 145 mEq/L Final  . Potassium 05/20/2014 4.1  3.5 - 5.1 mEq/L Final  . Chloride 05/20/2014 109  96 - 112 mEq/L Final  . CO2 05/20/2014 24  19 - 32 mEq/L Final  . Glucose, Bld 05/20/2014 201* 70 - 99 mg/dL Final  . BUN 40/98/1191 19  6 - 23 mg/dL Final  .  Creatinine, Ser 05/20/2014 1.08  0.40 - 1.50 mg/dL Final  . Total Bilirubin 05/20/2014 0.6  0.2 - 1.2 mg/dL Final  . Alkaline Phosphatase 05/20/2014 72  39 - 117 U/L Final  . AST 05/20/2014 16  0 - 37 U/L Final  . ALT 05/20/2014 21  0 - 53 U/L Final  . Total Protein 05/20/2014 6.8  6.0 - 8.3 g/dL Final  . Albumin 16/11/9602 3.9  3.5 - 5.2 g/dL Final  . Calcium 54/10/8117 9.0  8.4 - 10.5 mg/dL Final  . GFR 14/78/2956 72.50  >60.00 mL/min Final  . Cholesterol 05/20/2014 155  0 - 200 mg/dL Final   ATP III Classification       Desirable:  < 200 mg/dL               Borderline High:  200 - 239 mg/dL          High:  > = 213 mg/dL  .  Triglycerides 05/20/2014 111.0  0.0 - 149.0 mg/dL Final   Normal:  <086 mg/dLBorderline High:  150 - 199 mg/dL  . HDL 05/20/2014 48.20  >39.00 mg/dL Final  . VLDL 57/84/6962 22.2  0.0 - 40.0 mg/dL Final  . LDL Cholesterol 05/20/2014 85  0 - 99 mg/dL Final  . Total CHOL/HDL Ratio 05/20/2014 3   Final                  Men          Women1/2 Average Risk     3.4          3.3Average Risk          5.0          4.42X Average Risk          9.6          7.13X Average Risk          15.0          11.0                      . NonHDL 05/20/2014 106.80   Final   NOTE:  Non-HDL goal should be 30 mg/dL higher than patient's LDL goal (i.e. LDL goal of < 70 mg/dL, would have non-HDL goal of < 100 mg/dL)  . Microalb, Ur 05/20/2014 <0.7  0.0 - 1.9 mg/dL Final  . Creatinine,U 95/28/4132 65.5   Final  . Microalb Creat Ratio 05/20/2014 1.1  0.0 - 30.0 mg/g Final  . Color, Urine 05/20/2014 YELLOW  Yellow;Lt. Yellow Final  . APPearance 05/20/2014 CLEAR  Clear Final  . Specific Gravity, Urine 05/20/2014 1.020  1.000-1.030 Final  . pH 05/20/2014 5.0  5.0 - 8.0 Final  . Total Protein, Urine 05/20/2014 NEGATIVE  Negative Final  . Urine Glucose 05/20/2014 >=1000* Negative Final   Results faxed to site/floor on 05/20/2014 12:36 PM by Luellen Pucker.  Marland Kitchen Ketones, ur 05/20/2014 NEGATIVE  Negative Final  . Bilirubin Urine 05/20/2014 NEGATIVE  Negative Final  . Hgb urine dipstick 05/20/2014 NEGATIVE  Negative Final  . Urobilinogen, UA 05/20/2014 0.2  0.0 - 1.0 Final  . Leukocytes, UA 05/20/2014 NEGATIVE  Negative Final  . Nitrite 05/20/2014 NEGATIVE  Negative Final  . WBC, UA 05/20/2014 0-2/hpf  0-2/hpf Final  . RBC / HPF 05/20/2014 none seen  0-2/hpf Final      Medication List       This list is accurate as of: 05/25/14 10:13 AM.  Always use your  most recent med list.               ACCU-CHEK AVIVA PLUS test strip  Generic drug:  glucose blood  2 (two) times daily.     ACCU-CHEK SOFTCLIX LANCETS lancets      alfuzosin 10 MG 24 hr tablet  Commonly known as:  UROXATRAL  Take 10 mg by mouth daily.     ALPRAZolam 0.5 MG tablet  Commonly known as:  XANAX     atorvastatin 80 MG tablet  Commonly known as:  LIPITOR  Take 1 tablet (80 mg total) by mouth daily.     buPROPion 300 MG 24 hr tablet  Commonly known as:  WELLBUTRIN XL  Take 300 mg by mouth daily.     finasteride 5 MG tablet  Commonly known as:  PROSCAR     glyBURIDE 5 MG tablet  Commonly known as:  DIABETA  5 mg.     hydrocortisone-pramoxine 2.5-1 % rectal cream  Commonly known as:  ANALPRAM-HC     INVOKANA 300 MG Tabs tablet  Generic drug:  canagliflozin  TAKE 1 TABLET (300 MG TOTAL) BY MOUTH DAILY.     nystatin cream  Commonly known as:  MYCOSTATIN  APPLY 1 APPLICATION TOPICALLY 2 (TWO) TIMES DAILY.     omeprazole 40 MG capsule  Commonly known as:  PRILOSEC  as needed.     OSENI 25-30 MG Tabs  Generic drug:  Alogliptin-Pioglitazone  TAKE 1 TABLET BY MOUTH EVERY DAY     sildenafil 25 MG tablet  Commonly known as:  VIAGRA  Take 25 mg by mouth daily as needed for erectile dysfunction.        Allergies: No Known Allergies  No past medical history on file.  Past Surgical History  Procedure Laterality Date  . Minor hemorrhoidectomy N/A     Family History  Problem Relation Age of Onset  . Cancer Neg Hx     Social History:  reports that he has quit smoking. His smoking use included Cigarettes. He does not have any smokeless tobacco history on file. His alcohol and drug histories are not on file.  Review of Systems:   Has had hypercholesterolemia, this was not controlled with Zocor 80 mg and he was not able to afford Crestor Now taking 80 mg Lipitor His LDL is significantly better now and triglycerides/HDL are also excellent He can still do somewhat better on his diet  Lab Results  Component Value Date   CHOL 155 05/20/2014   HDL 48.20 05/20/2014   LDLCALC 85 05/20/2014   LDLDIRECT 134.6 11/18/2013    TRIG 111.0 05/20/2014   CHOLHDL 3 05/20/2014    No history of hypertension and has white coat blood pressure increase sometimes   Diabetic foot exam in 9/15 showed normal monofilament sensation in the toes and plantar surfaces, no skin lesions or ulcers on the feet and normal pedal pulses    Examination:   BP 142/70 mmHg  Pulse 98  Temp(Src) 98.1 F (36.7 C)  Resp 14  Ht 5\' 8"  (1.727 m)  Wt 166 lb 12.8 oz (75.66 kg)  BMI 25.37 kg/m2  SpO2 96%  Body mass index is 25.37 kg/(m^2).   Repeat blood pressure 138/74  Assesment/Plan:   Diabetes type 2 with BMI 25  His blood sugar control is fairly good overall with a 4 drug regimen and A1c right around 7% Most of his high readings are related to inconsistent diet He has lost some weight and  is trying to walk regularly Since he is on near maximal doses of all his medications will not change his regimen as yet Encouraged him to start watching his diet more consistently He does need to check blood sugars more consistently after breakfast or supper also  HYPERCHOLESTEROLEMIA:  LDL is better controlled with Lipitor 80 mg compared to Zocor    Bj Morlock 05/25/2014, 10:13 AM   LABS:  Lab on 05/20/2014  Component Date Value Ref Range Status  . Hgb A1c MFr Bld 05/20/2014 7.0* 4.6 - 6.5 % Final   Glycemic Control Guidelines for People with Diabetes:Non Diabetic:  <6%Goal of Therapy: <7%Additional Action Suggested:  >8%   . Sodium 05/20/2014 138  135 - 145 mEq/L Final  . Potassium 05/20/2014 4.1  3.5 - 5.1 mEq/L Final  . Chloride 05/20/2014 109  96 - 112 mEq/L Final  . CO2 05/20/2014 24  19 - 32 mEq/L Final  . Glucose, Bld 05/20/2014 201* 70 - 99 mg/dL Final  . BUN 40/98/1191 19  6 - 23 mg/dL Final  . Creatinine, Ser 05/20/2014 1.08  0.40 - 1.50 mg/dL Final  . Total Bilirubin 05/20/2014 0.6  0.2 - 1.2 mg/dL Final  . Alkaline Phosphatase 05/20/2014 72  39 - 117 U/L Final  . AST 05/20/2014 16  0 - 37 U/L Final  . ALT 05/20/2014 21   0 - 53 U/L Final  . Total Protein 05/20/2014 6.8  6.0 - 8.3 g/dL Final  . Albumin 47/82/9562 3.9  3.5 - 5.2 g/dL Final  . Calcium 13/09/6576 9.0  8.4 - 10.5 mg/dL Final  . GFR 46/96/2952 72.50  >60.00 mL/min Final  . Cholesterol 05/20/2014 155  0 - 200 mg/dL Final   ATP III Classification       Desirable:  < 200 mg/dL               Borderline High:  200 - 239 mg/dL          High:  > = 841 mg/dL  . Triglycerides 05/20/2014 111.0  0.0 - 149.0 mg/dL Final   Normal:  <324 mg/dLBorderline High:  150 - 199 mg/dL  . HDL 05/20/2014 48.20  >39.00 mg/dL Final  . VLDL 40/11/2723 22.2  0.0 - 40.0 mg/dL Final  . LDL Cholesterol 05/20/2014 85  0 - 99 mg/dL Final  . Total CHOL/HDL Ratio 05/20/2014 3   Final                  Men          Women1/2 Average Risk     3.4          3.3Average Risk          5.0          4.42X Average Risk          9.6          7.13X Average Risk          15.0          11.0                      . NonHDL 05/20/2014 106.80   Final   NOTE:  Non-HDL goal should be 30 mg/dL higher than patient's LDL goal (i.e. LDL goal of < 70 mg/dL, would have non-HDL goal of < 100 mg/dL)  . Microalb, Ur 05/20/2014 <0.7  0.0 - 1.9 mg/dL Final  . Creatinine,U 36/64/4034 65.5   Final  .  Microalb Creat Ratio 05/20/2014 1.1  0.0 - 30.0 mg/g Final  . Color, Urine 05/20/2014 YELLOW  Yellow;Lt. Yellow Final  . APPearance 05/20/2014 CLEAR  Clear Final  . Specific Gravity, Urine 05/20/2014 1.020  1.000-1.030 Final  . pH 05/20/2014 5.0  5.0 - 8.0 Final  . Total Protein, Urine 05/20/2014 NEGATIVE  Negative Final  . Urine Glucose 05/20/2014 >=1000* Negative Final   Results faxed to site/floor on 05/20/2014 12:36 PM by Luellen PuckerWyndham, Karen.  Marland Kitchen. Ketones, ur 05/20/2014 NEGATIVE  Negative Final  . Bilirubin Urine 05/20/2014 NEGATIVE  Negative Final  . Hgb urine dipstick 05/20/2014 NEGATIVE  Negative Final  . Urobilinogen, UA 05/20/2014 0.2  0.0 - 1.0 Final  . Leukocytes, UA 05/20/2014 NEGATIVE  Negative Final  .  Nitrite 05/20/2014 NEGATIVE  Negative Final  . WBC, UA 05/20/2014 0-2/hpf  0-2/hpf Final  . RBC / HPF 05/20/2014 none seen  0-2/hpf Final

## 2014-05-25 NOTE — Patient Instructions (Signed)
Please check blood sugars at least half the time about 2 hours after any meal and 3 times per week on waking up.  Please bring blood sugar monitor to each visit.  Recommended blood sugar levels about 2 hours after meal is 140-180 and on waking up 90-130   

## 2014-06-11 ENCOUNTER — Other Ambulatory Visit: Payer: Self-pay | Admitting: Endocrinology

## 2014-09-20 ENCOUNTER — Other Ambulatory Visit (INDEPENDENT_AMBULATORY_CARE_PROVIDER_SITE_OTHER): Payer: Federal, State, Local not specified - PPO

## 2014-09-20 DIAGNOSIS — E119 Type 2 diabetes mellitus without complications: Secondary | ICD-10-CM

## 2014-09-20 LAB — BASIC METABOLIC PANEL
BUN: 17 mg/dL (ref 6–23)
CO2: 24 mEq/L (ref 19–32)
Calcium: 9.1 mg/dL (ref 8.4–10.5)
Chloride: 108 mEq/L (ref 96–112)
Creatinine, Ser: 0.99 mg/dL (ref 0.40–1.50)
GFR: 80.08 mL/min (ref >60.00)
Glucose, Bld: 216 mg/dL — ABNORMAL HIGH (ref 70–99)
Potassium: 4 mEq/L (ref 3.5–5.1)
Sodium: 141 mEq/L (ref 135–145)

## 2014-09-20 LAB — HEMOGLOBIN A1C: Hgb A1c MFr Bld: 6.6 % — ABNORMAL HIGH (ref 4.6–6.5)

## 2014-09-24 ENCOUNTER — Ambulatory Visit (INDEPENDENT_AMBULATORY_CARE_PROVIDER_SITE_OTHER): Payer: Federal, State, Local not specified - PPO | Admitting: Endocrinology

## 2014-09-24 ENCOUNTER — Encounter: Payer: Self-pay | Admitting: Endocrinology

## 2014-09-24 VITALS — BP 124/70 | HR 100 | Temp 97.9°F | Resp 16 | Ht 68.0 in | Wt 158.4 lb

## 2014-09-24 DIAGNOSIS — E7800 Pure hypercholesterolemia, unspecified: Secondary | ICD-10-CM

## 2014-09-24 DIAGNOSIS — Z23 Encounter for immunization: Secondary | ICD-10-CM

## 2014-09-24 DIAGNOSIS — E119 Type 2 diabetes mellitus without complications: Secondary | ICD-10-CM

## 2014-09-24 DIAGNOSIS — B354 Tinea corporis: Secondary | ICD-10-CM

## 2014-09-24 MED ORDER — KETOCONAZOLE 2 % EX CREA: 1.0000 "application " | TOPICAL_CREAM | Freq: Every day | CUTANEOUS | Status: DC

## 2014-09-24 NOTE — Patient Instructions (Signed)
Check blood sugars on waking up .. 2-3 .. times a week  Also check blood sugars about 2 hours after a meal and do this after different meals by rotation  Recommended blood sugar levels on waking up is 90-130 and about 2 hours after meal is 140-180 Please bring blood sugar monitor to each visit.  

## 2014-09-24 NOTE — Progress Notes (Signed)
Patient ID: Kenneth Lawson, male   DOB: May 04, 1945, 69 y.o.   MRN: [id number]   Reason for Appointment: Diabetes follow-up    History of Present Illness    Diagnosis: date of diagnosis: 1997.   Previous history: He was previously treated with glyburide, Actos and metformin.  Metformin was stopped because he had GI side effects  His blood sugars had been relatively high in the last 3-4 years and A1c had been consistently over 7.5% until his initial consultation He was started on Invokana in 10/13 which had helped modestly. His blood sugars had shown considerable improvement with  adding  Nesina since 2/14 .    He was also on Actos which he had stopped but blood sugars did not appear to worsen. .  The previous HbgA1c levels: 6.4 in 4/14,  7.4 in 12/13, prior range from 7.8-8.3 since 2011   RECENT history:  His blood sugars are overall improved with A1c going down to 6.6, previously 7% However he has checked his blood sugars very sporadically and has only 2 readings on his monitor today He has been very compliant with exercise He thinks he has lost weight from decreased appetite for various reasons No hypoglycemia with taking 5 mg glyburide twice a day  He  has been on Invokana 300 mg consistently and is not having any candidiasis lately  Oral hypoglycemic drugs: Oseni, Invokana , glyburide bid  Side effects from medications: bloating, gas and GI distress from metformin, even 500 mg.   Monitors blood glucose: Sporadically  Glucometer: Accucheck. Blood Glucose readings from monitor 133, 159  Hypoglycemia: none recently   Meals: content is Variable, may be getting more dairy products at times; am cheese/ham and toast Physical activity: exercise: He is walking outside, Average 2 miles or more a day  Dietician visit: Most recent: 1997 at diagnosis.  Retinal exam: 07/24/13, history of mild nonproliferative retinopathy OD  Peripheral neuropathy: Not previously diagnosed.  Diabetic  nephropathy: not present.   Wt Readings from Last 3 Encounters:  09/24/14 158 lb 6.4 oz (71.85 kg)  05/25/14 166 lb 12.8 oz (75.66 kg)  02/24/14 173 lb 9.6 oz (78.744 kg)    Lab Results  Component Value Date   HGBA1C 6.6* 09/20/2014   HGBA1C 7.0* 05/20/2014   HGBA1C 6.9* 02/16/2014   Lab Results  Component Value Date   MICROALBUR <0.7 05/20/2014   LDLCALC 85 05/20/2014   CREATININE 0.99 09/20/2014    Lab on 09/20/2014  Component Date Value Ref Range Status  . Hgb A1c MFr Bld 09/20/2014 6.6* 4.6 - 6.5 % Final   Glycemic Control Guidelines for People with Diabetes:Non Diabetic:  <6%Goal of Therapy: <7%Additional Action Suggested:  >8%   . Sodium 09/20/2014 141  135 - 145 mEq/L Final  . Potassium 09/20/2014 4.0  3.5 - 5.1 mEq/L Final  . Chloride 09/20/2014 108  96 - 112 mEq/L Final  . CO2 09/20/2014 24  19 - 32 mEq/L Final  . Glucose, Bld 09/20/2014 216* 70 - 99 mg/dL Final  . BUN 40/98/1191 17  6 - 23 mg/dL Final  . Creatinine, Ser 09/20/2014 0.99  0.40 - 1.50 mg/dL Final  . Calcium 47/82/9562 9.1  8.4 - 10.5 mg/dL Final  . GFR 13/09/6576 80.08  >60.00 mL/min Final      Medication List       This list is accurate as of: 09/24/14 10:15 AM.  Always use your most recent med list.  ACCU-CHEK AVIVA PLUS test strip  Generic drug:  glucose blood  2 (two) times daily.     ACCU-CHEK SOFTCLIX LANCETS lancets     alfuzosin 10 MG 24 hr tablet  Commonly known as:  UROXATRAL  Take 10 mg by mouth daily.     ALPRAZolam 0.5 MG tablet  Commonly known as:  XANAX     atorvastatin 80 MG tablet  Commonly known as:  LIPITOR  Take 1 tablet (80 mg total) by mouth daily.     buPROPion 300 MG 24 hr tablet  Commonly known as:  WELLBUTRIN XL  Take 300 mg by mouth daily.     clotrimazole-betamethasone cream  Commonly known as:  LOTRISONE  1 APPLICATION APPLY ON THE SKIN TWICE A DAY APPLY TO AFFECTED AREA BID X 10 DAYS     finasteride 5 MG tablet  Commonly known as:   PROSCAR     glyBURIDE 5 MG tablet  Commonly known as:  DIABETA  5 mg.     hydrocortisone-pramoxine 2.5-1 % rectal cream  Commonly known as:  ANALPRAM-HC     INVOKANA 300 MG Tabs tablet  Generic drug:  canagliflozin  TAKE 1 TABLET (300 MG TOTAL) BY MOUTH DAILY.     mometasone 50 MCG/ACT nasal spray  Commonly known as:  NASONEX  USE 2 SPRAYS IN EACH NOSTRIL NASALLY ONCE A DAY AS NEEDED     nystatin cream  Commonly known as:  MYCOSTATIN  APPLY 1 APPLICATION TOPICALLY 2 (TWO) TIMES DAILY.     omeprazole 40 MG capsule  Commonly known as:  PRILOSEC  as needed.     OSENI 25-30 MG Tabs  Generic drug:  Alogliptin-Pioglitazone  TAKE 1 TABLET BY MOUTH EVERY DAY     sildenafil 25 MG tablet  Commonly known as:  VIAGRA  Take 25 mg by mouth daily as needed for erectile dysfunction.        Allergies: No Known Allergies  No past medical history on file.  Past Surgical History  Procedure Laterality Date  . Minor hemorrhoidectomy N/A     Family History  Problem Relation Age of Onset  . Cancer Neg Hx     Social History:  reports that he has quit smoking. His smoking use included Cigarettes. He does not have any smokeless tobacco history on file. His alcohol and drug histories are not on file.  Review of Systems:   Has had hypercholesterolemia, this was not controlled with Zocor 80 mg and he was not able to afford Crestor Now taking 80 mg Lipitor His LDL is below 100 and triglycerides/HDL are also excellent He can still do somewhat better on his diet  Lab Results  Component Value Date   CHOL 155 05/20/2014   HDL 48.20 05/20/2014   LDLCALC 85 05/20/2014   LDLDIRECT 134.6 11/18/2013   TRIG 111.0 05/20/2014   CHOLHDL 3 05/20/2014    No history of hypertension and has white coat blood pressure increase sometimes   Diabetic foot exam in 9/15 showed normal monofilament sensation in the toes and plantar surfaces, no skin lesions or ulcers on the feet and normal pedal  pulses  He was given a tube of Lotrimin for his rash and pruritus on the thigh by urgent care and he thinks this is not resolved   Examination:   BP 124/70 mmHg  Pulse 100  Temp(Src) 97.9 F (36.6 C)  Resp 16  Ht 5\' 8"  (1.727 m)  Wt 158 lb 6.4 oz (71.85 kg)  BMI 24.09 kg/m2  SpO2 96%  Body mass index is 24.09 kg/(m^2).   He has a slightly erythematous patch on the right inner thigh  Assesment/Plan:   Diabetes type 2 with BMI 25  His blood sugar control is good overall with a 4 drug regimen and A1c now down to 6.6 Some of his improvement is likely to be from his decreased food intake for other reasons and weight loss He is trying to walk regularly His lab glucose was 216 and not clear what foods make his blood sugars go up He has been fairly noncompliant with checking his blood sugars and this was discussed He will check more readings after meals to help him plan his meals better  HYPERCHOLESTEROLEMIA: LDL to be checked periodically  PREVENTIVE care: He has not had Prevnar and this will be given today  Probable tinea corporis: He will try Nizoral cream until seen by PCP   [facility] 09/24/2014, 10:15 AM   LABS:  Lab on 09/20/2014  Component Date Value Ref Range Status  . Hgb A1c MFr Bld 09/20/2014 6.6* 4.6 - 6.5 % Final   Glycemic Control Guidelines for People with Diabetes:Non Diabetic:  <6%Goal of Therapy: <7%Additional Action Suggested:  >8%   . Sodium 09/20/2014 141  135 - 145 mEq/L Final  . Potassium 09/20/2014 4.0  3.5 - 5.1 mEq/L Final  . Chloride 09/20/2014 108  96 - 112 mEq/L Final  . CO2 09/20/2014 24  19 - 32 mEq/L Final  . Glucose, Bld 09/20/2014 216* 70 - 99 mg/dL Final  . BUN 16/11/9602 17  6 - 23 mg/dL Final  . Creatinine, Ser 09/20/2014 0.99  0.40 - 1.50 mg/dL Final  . Calcium 54/10/8117 9.1  8.4 - 10.5 mg/dL Final  . GFR 14/78/2956 80.08  >60.00 mL/min Final

## 2014-09-30 ENCOUNTER — Other Ambulatory Visit: Payer: Self-pay | Admitting: Endocrinology

## 2014-12-05 ENCOUNTER — Other Ambulatory Visit: Payer: Self-pay | Admitting: Endocrinology

## 2014-12-21 ENCOUNTER — Other Ambulatory Visit: Payer: Self-pay | Admitting: Endocrinology

## 2015-01-19 ENCOUNTER — Other Ambulatory Visit (INDEPENDENT_AMBULATORY_CARE_PROVIDER_SITE_OTHER): Payer: Federal, State, Local not specified - PPO

## 2015-01-19 DIAGNOSIS — E119 Type 2 diabetes mellitus without complications: Secondary | ICD-10-CM | POA: Diagnosis not present

## 2015-01-19 LAB — BASIC METABOLIC PANEL
BUN: 23 mg/dL (ref 6–23)
CO2: 28 mEq/L (ref 19–32)
Calcium: 9 mg/dL (ref 8.4–10.5)
Chloride: 105 mEq/L (ref 96–112)
Creatinine, Ser: 1.04 mg/dL (ref 0.40–1.50)
GFR: 75.58 mL/min (ref >60.00)
Glucose, Bld: 156 mg/dL — ABNORMAL HIGH (ref 70–99)
Potassium: 3.8 mEq/L (ref 3.5–5.1)
Sodium: 140 mEq/L (ref 135–145)

## 2015-01-19 LAB — HEMOGLOBIN A1C: Hgb A1c MFr Bld: 6.9 % — ABNORMAL HIGH (ref 4.6–6.5)

## 2015-01-21 ENCOUNTER — Other Ambulatory Visit: Payer: Federal, State, Local not specified - PPO

## 2015-01-25 ENCOUNTER — Ambulatory Visit (INDEPENDENT_AMBULATORY_CARE_PROVIDER_SITE_OTHER): Payer: Federal, State, Local not specified - PPO | Admitting: Endocrinology

## 2015-01-25 ENCOUNTER — Encounter: Payer: Self-pay | Admitting: Endocrinology

## 2015-01-25 VITALS — BP 124/76 | HR 80 | Temp 98.7°F | Resp 14 | Ht 68.0 in | Wt 162.8 lb

## 2015-01-25 DIAGNOSIS — E119 Type 2 diabetes mellitus without complications: Secondary | ICD-10-CM | POA: Diagnosis not present

## 2015-01-25 NOTE — Patient Instructions (Signed)
Reduce Glyburide to 1 1/2 tabs

## 2015-01-25 NOTE — Progress Notes (Signed)
Patient ID: Kenneth Lawson, male   DOB: 03/08/1945, 69 y.o.   MRN: [id number]   Reason for Appointment: Diabetes follow-up    History of Present Illness    Diagnosis: date of diagnosis: 1997.   Previous history: He was previously treated with glyburide, Actos and metformin.  Metformin was stopped because he had GI side effects  His blood sugars had been relatively high in the last 3-4 years and A1c had been consistently over 7.5% until his initial consultation He was started on Invokana in 10/13 which had helped modestly. His blood sugars had shown considerable improvement with  adding  Nesina since 2/14 .    He was also on Actos which he had stopped but blood sugars did not appear to worsen. .  The previous HbgA1c levels: 6.4 in 4/14,  7.4 in 12/13, prior range from 7.8-8.3 since 2011   RECENT history:  His blood sugars are overall not as well controlled with A1c 6.9, previously 6.6 He is compliant with his 3 prescriptions  He  has been on Invokana 300 mg consistently and is not having any candidiasis lately  Current blood sugar patterns, management and problems identified:  He says his blood sugars tend to be high when he is stressed and probably does not consistently watch carbohydrates on those days; he had readings between 150-194 in early November for about 10 days  Overall checking his blood sugar somewhat erratically and not as much especially with no readings after supper  He has low normal fasting blood sugars recently without symptoms of hypoglycemia; although he was supposed to take glyburide 5 mg twice a day now he is taking this with 10 mg at dinnertime  Has only one nonfasting reading recently in the evening and no readings since last Friday  Oral hypoglycemic drugs: Oseni, Invokana 300mg , glyburide 5mg  bid  Side effects from medications: bloating, gas and GI distress from metformin, even 500 mg.   Monitors blood glucose: Every 2 or 3 days  Glucometer:  Accucheck. Blood Glucose readings from monitor showed recent range to be 73-142 with most readings in the mornings Overall average for the last month 138    Hypoglycemia: none recently   Meals: content is Variable, may be getting more dairy products at times; am cheese/ham and toast Physical activity: exercise: He is walking outside, Average 2 miles or more a day with his dog  Dietician visit: Most recent: 1997 at diagnosis.  Retinal exam: 07/24/13, history of mild nonproliferative retinopathy OD  Peripheral neuropathy: Not previously diagnosed.  Diabetic nephropathy: not present.   Wt Readings from Last 3 Encounters:  01/25/15 162 lb 12.8 oz (73.846 kg)  09/24/14 158 lb 6.4 oz (71.85 kg)  05/25/14 166 lb 12.8 oz (75.66 kg)    Lab Results  Component Value Date   HGBA1C 6.9* 01/19/2015   HGBA1C 6.6* 09/20/2014   HGBA1C 7.0* 05/20/2014   Lab Results  Component Value Date   MICROALBUR <0.7 05/20/2014   LDLCALC 85 05/20/2014   CREATININE 1.04 01/19/2015    Lab on 01/19/2015  Component Date Value Ref Range Status  . Hgb A1c MFr Bld 01/19/2015 6.9* 4.6 - 6.5 % Final   Glycemic Control Guidelines for People with Diabetes:Non Diabetic:  <6%Goal of Therapy: <7%Additional Action Suggested:  >8%   . Sodium 01/19/2015 140  135 - 145 mEq/L Final  . Potassium 01/19/2015 3.8  3.5 - 5.1 mEq/L Final  . Chloride 01/19/2015 105  96 - 112 mEq/L Final  .  CO2 01/19/2015 28  19 - 32 mEq/L Final  . Glucose, Bld 01/19/2015 156* 70 - 99 mg/dL Final  . BUN 16/11/9602 23  6 - 23 mg/dL Final  . Creatinine, Ser 01/19/2015 1.04  0.40 - 1.50 mg/dL Final  . Calcium 54/10/8117 9.0  8.4 - 10.5 mg/dL Final  . GFR 14/78/2956 75.58  >60.00 mL/min Final      Medication List       This list is accurate as of: 01/25/15 10:18 AM.  Always use your most recent med list.               ACCU-CHEK AVIVA PLUS test strip  Generic drug:  glucose blood  2 (two) times daily.     ACCU-CHEK SOFTCLIX LANCETS  lancets     alfuzosin 10 MG 24 hr tablet  Commonly known as:  UROXATRAL  Take 10 mg by mouth daily.     ALPRAZolam 0.5 MG tablet  Commonly known as:  XANAX     atorvastatin 80 MG tablet  Commonly known as:  LIPITOR  Take 1 tablet (80 mg total) by mouth daily.     buPROPion 300 MG 24 hr tablet  Commonly known as:  WELLBUTRIN XL  Take 300 mg by mouth daily.     clotrimazole-betamethasone cream  Commonly known as:  LOTRISONE  1 APPLICATION APPLY ON THE SKIN TWICE A DAY APPLY TO AFFECTED AREA BID X 10 DAYS     finasteride 5 MG tablet  Commonly known as:  PROSCAR     glyBURIDE 5 MG tablet  Commonly known as:  DIABETA  5 mg.     hydrocortisone-pramoxine 2.5-1 % rectal cream  Commonly known as:  ANALPRAM-HC     INVOKANA 300 MG Tabs tablet  Generic drug:  canagliflozin  TAKE 1 TABLET (300 MG TOTAL) BY MOUTH DAILY.     ketoconazole 2 % cream  Commonly known as:  NIZORAL  Apply 1 application topically daily.     mometasone 50 MCG/ACT nasal spray  Commonly known as:  NASONEX  USE 2 SPRAYS IN EACH NOSTRIL NASALLY ONCE A DAY AS NEEDED     nystatin cream  Commonly known as:  MYCOSTATIN  APPLY 1 APPLICATION TOPICALLY 2 (TWO) TIMES DAILY.     omeprazole 40 MG capsule  Commonly known as:  PRILOSEC  as needed.     OSENI 25-30 MG Tabs  Generic drug:  Alogliptin-Pioglitazone  TAKE 1 TABLET BY MOUTH EVERY DAY     sildenafil 25 MG tablet  Commonly known as:  VIAGRA  Take 25 mg by mouth daily as needed for erectile dysfunction.        Allergies: No Known Allergies  No past medical history on file.  Past Surgical History  Procedure Laterality Date  . Minor hemorrhoidectomy N/A     Family History  Problem Relation Age of Onset  . Cancer Neg Hx     Social History:  reports that he has quit smoking. His smoking use included Cigarettes. He does not have any smokeless tobacco history on file. His alcohol and drug histories are not on file.  Review of Systems:   Has  had hypercholesterolemia, this was not controlled with Zocor 80 mg and he was not able to afford Crestor Now taking 80 mg Lipitor His LDL is below 100 and triglycerides/HDL are also excellent   Lab Results  Component Value Date   CHOL 155 05/20/2014   HDL 48.20 05/20/2014   LDLCALC 85 05/20/2014  LDLDIRECT 134.6 11/18/2013   TRIG 111.0 05/20/2014   CHOLHDL 3 05/20/2014    No history of hypertension and has white coat blood pressure increase sometimes  Diabetic foot exam in 11/16 showed normal monofilament sensation in the toes and plantar surfaces, no skin lesions or ulcers on the feet and normal pedal pulses      Examination:   BP 124/76 mmHg  Pulse 80  Temp(Src) 98.7 F (37.1 C)  Resp 14  Ht  (1.727 m)  Wt 162 lb 12.8 oz (73.846 kg)  BMI 24.76 kg/m2  SpO2 96%  Body mass index is 24.76 kg/(m^2).    Diabetic foot exam shows normal monofilament sensation in the toes and plantar surfaces, no skin lesions or ulcers on the feet and normal pedal pulses   Assesment/Plan:   Diabetes type 2 with BMI 25  His blood sugar control is good overall with a 4 drug regimen and A1c still under 7% However he has some inconsistent readings when he is stressed are not watching carbohydrates  He is trying to walk regularly  Since he has low normal readings occasionally fasting and he is taking 10 mg glyburide and evening and reduce the dose to 7.5 mg  HYPERCHOLESTEROLEMIA: LDL to be checked periodically, well controlled as of 3/16    Marcelina Mclaurin 01/25/2015, 10:18 AM   LABS:  Lab on 01/19/2015  Component Date Value Ref Range Status  . Hgb A1c MFr Bld 01/19/2015 6.9* 4.6 - 6.5 % Final   Glycemic Control Guidelines for People with Diabetes:Non Diabetic:  <6%Goal of Therapy: <7%Additional Action Suggested:  >8%   . Sodium 01/19/2015 140  135 - 145 mEq/L Final  . Potassium 01/19/2015 3.8  3.5 - 5.1 mEq/L Final  . Chloride 01/19/2015 105  96 - 112 mEq/L Final  . CO2 01/19/2015  28  19 - 32 mEq/L Final  . Glucose, Bld 01/19/2015 156* 70 - 99 mg/dL Final  . BUN 16/11/9602 23  6 - 23 mg/dL Final  . Creatinine, Ser 01/19/2015 1.04  0.40 - 1.50 mg/dL Final  . Calcium 54/10/8117 9.0  8.4 - 10.5 mg/dL Final  . GFR 14/78/2956 75.58  >60.00 mL/min Final

## 2015-01-26 ENCOUNTER — Other Ambulatory Visit: Payer: Self-pay | Admitting: *Deleted

## 2015-01-26 DIAGNOSIS — Z23 Encounter for immunization: Secondary | ICD-10-CM

## 2015-03-07 ENCOUNTER — Other Ambulatory Visit: Payer: Self-pay | Admitting: Endocrinology

## 2015-04-11 ENCOUNTER — Other Ambulatory Visit: Payer: Self-pay | Admitting: Endocrinology

## 2015-04-22 ENCOUNTER — Other Ambulatory Visit (INDEPENDENT_AMBULATORY_CARE_PROVIDER_SITE_OTHER): Payer: Federal, State, Local not specified - PPO

## 2015-04-22 DIAGNOSIS — E119 Type 2 diabetes mellitus without complications: Secondary | ICD-10-CM | POA: Diagnosis not present

## 2015-04-22 LAB — COMPREHENSIVE METABOLIC PANEL
ALT: 21 U/L (ref 0–53)
AST: 17 U/L (ref 0–37)
Albumin: 4 g/dL (ref 3.5–5.2)
Alkaline Phosphatase: 62 U/L (ref 39–117)
BUN: 18 mg/dL (ref 6–23)
CO2: 28 mEq/L (ref 19–32)
Calcium: 8.9 mg/dL (ref 8.4–10.5)
Chloride: 105 mEq/L (ref 96–112)
Creatinine, Ser: 0.99 mg/dL (ref 0.40–1.50)
GFR: 79.94 mL/min (ref >60.00)
Glucose, Bld: 206 mg/dL — ABNORMAL HIGH (ref 70–99)
Potassium: 4.1 mEq/L (ref 3.5–5.1)
Sodium: 138 mEq/L (ref 135–145)
Total Bilirubin: 0.6 mg/dL (ref 0.2–1.2)
Total Protein: 6.6 g/dL (ref 6.0–8.3)

## 2015-04-22 LAB — POCT URINALYSIS DIPSTICK
Blood, UA: NEGATIVE
Glucose, UA: 2
Ketones, UA: NEGATIVE
Leukocytes, UA: NEGATIVE
Nitrite, UA: NEGATIVE
Protein, UA: NEGATIVE
Spec Grav, UA: 1.01
Urobilinogen, UA: 0.2
pH, UA: 6

## 2015-04-22 LAB — LIPID PANEL
Cholesterol: 155 mg/dL (ref 0–200)
HDL: 50.6 mg/dL (ref >39.00)
LDL Cholesterol: 87 mg/dL (ref 0–99)
NonHDL: 104.68
Total CHOL/HDL Ratio: 3
Triglycerides: 87 mg/dL (ref 0.0–149.0)
VLDL: 17.4 mg/dL (ref 0.0–40.0)

## 2015-04-22 LAB — MICROALBUMIN / CREATININE URINE RATIO
Creatinine,U: 71.6 mg/dL
Microalb Creat Ratio: 1 mg/g (ref 0.0–30.0)
Microalb, Ur: <0.7 mg/dL (ref 0.0–1.9)

## 2015-04-22 LAB — HEMOGLOBIN A1C: Hgb A1c MFr Bld: 6.4 % (ref 4.6–6.5)

## 2015-04-26 ENCOUNTER — Ambulatory Visit (INDEPENDENT_AMBULATORY_CARE_PROVIDER_SITE_OTHER): Payer: Federal, State, Local not specified - PPO | Admitting: Endocrinology

## 2015-04-26 ENCOUNTER — Encounter: Payer: Self-pay | Admitting: Endocrinology

## 2015-04-26 VITALS — BP 134/76 | HR 86 | Temp 97.8°F | Resp 14 | Ht 68.0 in | Wt 163.2 lb

## 2015-04-26 DIAGNOSIS — E119 Type 2 diabetes mellitus without complications: Secondary | ICD-10-CM | POA: Diagnosis not present

## 2015-04-26 DIAGNOSIS — E7800 Pure hypercholesterolemia, unspecified: Secondary | ICD-10-CM

## 2015-04-26 NOTE — Progress Notes (Signed)
Patient ID: Kenneth Lawson, male   DOB: 1945-11-20, 70 y.o.   MRN: [id number]   Reason for Appointment: Diabetes follow-up    History of Present Illness    Diagnosis: date of diagnosis: 1997.   Previous history: He was previously treated with glyburide, Actos and metformin.  Metformin was stopped because he had GI side effects  His blood sugars had been relatively high in the last 3-4 years and A1c had been consistently over 7.5% until his initial consultation He was started on Invokana in 10/13 which had helped modestly. His blood sugars had shown considerable improvement with  adding  Nesina since 2/14 .    He was also on Actos which he had stopped but blood sugars did not appear to worsen. .  The previous HbgA1c levels: 6.4 in 4/14,  7.4 in 12/13, prior range from 7.8-8.3 since 2011   RECENT history:   Oral hypoglycemic drugs: Oseni, Invokana 300mg , glyburide 10mg  q pm  His blood sugars are overall better with A1c 6.4 now  He  has been on Invokana 300 mg consistently and is not having any candidiasis lately  Current blood sugar patterns, management and problems identified:  He has only 4 blood sugars in the last month   one reading of 86 in the evening   However his A1c is relatively better at 6.4  No hypoglycemia by symptoms even with taking 10 mg of glyburide in the evening  Although he thinks he has been inconsistent with his diet his weight is about the same  Also he tries to be fairly active with walking daily    Side effects from medications: bloating, gas and GI distress from metformin, even 500 mg.   Monitors blood glucose: Every 2 or 3 days  Glucometer: Accucheck. Blood Glucose readingsas above  Hypoglycemia: none recently   Meals: content is Variable, may be getting more dairy products at times; am cheese/ham and toast Physical activity: exercise: He is walking outside, Average 2 miles or more a day with his dog  Dietician visit: Most recent:  1997 at diagnosis.  Retinal exam: 07/24/13, history of mild nonproliferative retinopathy OD  Peripheral neuropathy: Not previously diagnosed.  Diabetic nephropathy: not present.   Wt Readings from Last 3 Encounters:  04/26/15 163 lb 3.2 oz (74.027 kg)  01/25/15 162 lb 12.8 oz (73.846 kg)  09/24/14 158 lb 6.4 oz (71.85 kg)    Lab Results  Component Value Date   HGBA1C 6.4 04/22/2015   HGBA1C 6.9* 01/19/2015   HGBA1C 6.6* 09/20/2014   Lab Results  Component Value Date   MICROALBUR <0.7 04/22/2015   LDLCALC 87 04/22/2015   CREATININE 0.99 04/22/2015    Lab on 04/22/2015  Component Date Value Ref Range Status  . Hgb A1c MFr Bld 04/22/2015 6.4  4.6 - 6.5 % Final   Glycemic Control Guidelines for People with Diabetes:Non Diabetic:  <6%Goal of Therapy: <7%Additional Action Suggested:  >8%   . Sodium 04/22/2015 138  135 - 145 mEq/L Final  . Potassium 04/22/2015 4.1  3.5 - 5.1 mEq/L Final  . Chloride 04/22/2015 105  96 - 112 mEq/L Final  . CO2 04/22/2015 28  19 - 32 mEq/L Final  . Glucose, Bld 04/22/2015 206* 70 - 99 mg/dL Final  . BUN 14/78/2956 18  6 - 23 mg/dL Final  . Creatinine, Ser 04/22/2015 0.99  0.40 - 1.50 mg/dL Final  . Total Bilirubin 04/22/2015 0.6  0.2 - 1.2 mg/dL Final  . Alkaline Phosphatase  04/22/2015 62  39 - 117 U/L Final  . AST 04/22/2015 17  0 - 37 U/L Final  . ALT 04/22/2015 21  0 - 53 U/L Final  . Total Protein 04/22/2015 6.6  6.0 - 8.3 g/dL Final  . Albumin 16/11/9602 4.0  3.5 - 5.2 g/dL Final  . Calcium 54/10/8117 8.9  8.4 - 10.5 mg/dL Final  . GFR 14/78/2956 79.94  >60.00 mL/min Final  . Microalb, Ur 04/22/2015 <0.7  0.0 - 1.9 mg/dL Final  . Creatinine,U 21/30/8657 71.6   Final  . Microalb Creat Ratio 04/22/2015 1.0  0.0 - 30.0 mg/g Final  . Cholesterol 04/22/2015 155  0 - 200 mg/dL Final   ATP III Classification       Desirable:  < 200 mg/dL               Borderline High:  200 - 239 mg/dL          High:  > = 846 mg/dL  . Triglycerides 04/22/2015 87.0   0.0 - 149.0 mg/dL Final   Normal:  <962 mg/dLBorderline High:  150 - 199 mg/dL  . HDL 04/22/2015 50.60  >39.00 mg/dL Final  . VLDL 95/28/4132 17.4  0.0 - 40.0 mg/dL Final  . LDL Cholesterol 04/22/2015 87  0 - 99 mg/dL Final  . Total CHOL/HDL Ratio 04/22/2015 3   Final                  Men          Women1/2 Average Risk     3.4          3.3Average Risk          5.0          4.42X Average Risk          9.6          7.13X Average Risk          15.0          11.0                      . NonHDL 04/22/2015 104.68   Final   NOTE:  Non-HDL goal should be 30 mg/dL higher than patient's LDL goal (i.e. LDL goal of < 70 mg/dL, would have non-HDL goal of < 100 mg/dL)  . Color, UA 04/22/2015 yellow   Final  . Clarity, UA 04/22/2015 clear   Final  . Glucose, UA 04/22/2015 2 or more   Final  . Bilirubin, UA 04/22/2015 small   Final  . Ketones, UA 04/22/2015 neg   Final  . Spec Grav, UA 04/22/2015 1.010   Final  . Blood, UA 04/22/2015 negative   Final  . pH, UA 04/22/2015 6.0   Final  . Protein, UA 04/22/2015 negative   Final  . Urobilinogen, UA 04/22/2015 0.2   Final  . Nitrite, UA 04/22/2015 negative   Final  . Leukocytes, UA 04/22/2015 Negative  Negative Final      Medication List       This list is accurate as of: 04/26/15 11:59 PM.  Always use your most recent med list.               ACCU-CHEK AVIVA PLUS test strip  Generic drug:  glucose blood  2 (two) times daily.     ACCU-CHEK SOFTCLIX LANCETS lancets     alfuzosin 10 MG 24 hr tablet  Commonly known as:  UROXATRAL  Take  10 mg by mouth daily.     ALPRAZolam 0.5 MG tablet  Commonly known as:  XANAX     atorvastatin 80 MG tablet  Commonly known as:  LIPITOR  TAKE 1 TABLET DAILY     buPROPion 300 MG 24 hr tablet  Commonly known as:  WELLBUTRIN XL  Take 300 mg by mouth daily.     finasteride 5 MG tablet  Commonly known as:  PROSCAR     glyBURIDE 5 MG tablet  Commonly known as:  DIABETA  5 mg.     hydrocortisone-pramoxine  2.5-1 % rectal cream  Commonly known as:  ANALPRAM-HC     INVOKANA 300 MG Tabs tablet  Generic drug:  canagliflozin  TAKE 1 TABLET (300 MG TOTAL) BY MOUTH DAILY.     mometasone 50 MCG/ACT nasal spray  Commonly known as:  NASONEX  USE 2 SPRAYS IN EACH NOSTRIL NASALLY ONCE A DAY AS NEEDED     omeprazole 40 MG capsule  Commonly known as:  PRILOSEC  as needed.     OSENI 25-30 MG Tabs  Generic drug:  Alogliptin-Pioglitazone  TAKE 1 TABLET BY MOUTH EVERY DAY     sildenafil 25 MG tablet  Commonly known as:  VIAGRA  Take 25 mg by mouth daily as needed for erectile dysfunction.        Allergies: No Known Allergies  No past medical history on file.  Past Surgical History  Procedure Laterality Date  . Minor hemorrhoidectomy N/A     Family History  Problem Relation Age of Onset  . Cancer Neg Hx     Social History:  reports that he has quit smoking. His smoking use included Cigarettes. He does not have any smokeless tobacco history on file. His alcohol and drug histories are not on file.  Review of Systems:   Eye Exam last:  6/15 very mild background retinopathy noted  Has had hypercholesterolemia, this was not controlled with Zocor 80 mg and he was not able to afford Crestor Now taking 80 mg Lipitor  His LDL is below 100 and triglycerides/HDL are also excellent   Lab Results  Component Value Date   CHOL 155 04/22/2015   HDL 50.60 04/22/2015   LDLCALC 87 04/22/2015   LDLDIRECT 134.6 11/18/2013   TRIG 87.0 04/22/2015   CHOLHDL 3 04/22/2015     Diabetic foot exam in 11/16 showed normal monofilament sensation in the toes and plantar surfaces, no skin lesions or ulcers on the feet and normal pedal pulses      Examination:   BP 134/76 mmHg  Pulse 86  Temp(Src) 97.8 F (36.6 C)  Resp 14  Ht  (1.727 m)  Wt 163 lb 3.2 oz (74.027 kg)  BMI 24.82 kg/m2  SpO2 97%  Body mass index is 24.82 kg/(m^2).     Assesment/Plan:   Diabetes type 2 with BMI 25  His  blood sugar control is good overall with a 4 drug regimen and A1c  now 6.4 No hypoglycemia with 10 mg glyburide Renal function normal He is compliant with all his medications Also generally very active which is helping his control with can do better with diet which is somewhat dependent on his emotional state Since he is not having any hypoglycemia with glyburide will continue the same dose Encouraged him to check blood sugars more often especially after meals which he is not doing  HYPERLIPIDEMIA: Well controlled     Patient Instructions  Check blood sugars on waking up  2  times a week Also check blood sugars about 2 hours after a meal and do this after different meals by rotation  Recommended blood sugar levels on waking up is 90-130 and about 2 hours after meal is 130-160  Please bring your blood sugar monitor to each visit, thank you      [facility] 04/27/2015, 8:23 PM   LABS:  Lab on 04/22/2015  Component Date Value Ref Range Status  . Hgb A1c MFr Bld 04/22/2015 6.4  4.6 - 6.5 % Final   Glycemic Control Guidelines for People with Diabetes:Non Diabetic:  <6%Goal of Therapy: <7%Additional Action Suggested:  >8%   . Sodium 04/22/2015 138  135 - 145 mEq/L Final  . Potassium 04/22/2015 4.1  3.5 - 5.1 mEq/L Final  . Chloride 04/22/2015 105  96 - 112 mEq/L Final  . CO2 04/22/2015 28  19 - 32 mEq/L Final  . Glucose, Bld 04/22/2015 206* 70 - 99 mg/dL Final  . BUN 16/11/9602 18  6 - 23 mg/dL Final  . Creatinine, Ser 04/22/2015 0.99  0.40 - 1.50 mg/dL Final  . Total Bilirubin 04/22/2015 0.6  0.2 - 1.2 mg/dL Final  . Alkaline Phosphatase 04/22/2015 62  39 - 117 U/L Final  . AST 04/22/2015 17  0 - 37 U/L Final  . ALT 04/22/2015 21  0 - 53 U/L Final  . Total Protein 04/22/2015 6.6  6.0 - 8.3 g/dL Final  . Albumin 54/10/8117 4.0  3.5 - 5.2 g/dL Final  . Calcium 14/78/2956 8.9  8.4 - 10.5 mg/dL Final  . GFR 21/30/8657 79.94  >60.00 mL/min Final  . Microalb, Ur 04/22/2015 <0.7  0.0 -  1.9 mg/dL Final  . Creatinine,U 84/69/6295 71.6   Final  . Microalb Creat Ratio 04/22/2015 1.0  0.0 - 30.0 mg/g Final  . Cholesterol 04/22/2015 155  0 - 200 mg/dL Final   ATP III Classification       Desirable:  < 200 mg/dL               Borderline High:  200 - 239 mg/dL          High:  > = 284 mg/dL  . Triglycerides 04/22/2015 87.0  0.0 - 149.0 mg/dL Final   Normal:  <132 mg/dLBorderline High:  150 - 199 mg/dL  . HDL 04/22/2015 50.60  >39.00 mg/dL Final  . VLDL 44/02/270 17.4  0.0 - 40.0 mg/dL Final  . LDL Cholesterol 04/22/2015 87  0 - 99 mg/dL Final  . Total CHOL/HDL Ratio 04/22/2015 3   Final                  Men          Women1/2 Average Risk     3.4          3.3Average Risk          5.0          4.42X Average Risk          9.6          7.13X Average Risk          15.0          11.0                      . NonHDL 04/22/2015 104.68   Final   NOTE:  Non-HDL goal should be 30 mg/dL higher than patient's LDL goal (i.e. LDL goal of < 70 mg/dL, would  have non-HDL goal of < 100 mg/dL)  . Color, UA 04/22/2015 yellow   Final  . Clarity, UA 04/22/2015 clear   Final  . Glucose, UA 04/22/2015 2 or more   Final  . Bilirubin, UA 04/22/2015 small   Final  . Ketones, UA 04/22/2015 neg   Final  . Spec Grav, UA 04/22/2015 1.010   Final  . Blood, UA 04/22/2015 negative   Final  . pH, UA 04/22/2015 6.0   Final  . Protein, UA 04/22/2015 negative   Final  . Urobilinogen, UA 04/22/2015 0.2   Final  . Nitrite, UA 04/22/2015 negative   Final  . Leukocytes, UA 04/22/2015 Negative  Negative Final

## 2015-04-26 NOTE — Patient Instructions (Signed)
Check blood sugars on waking up 2  times a week  Also check blood sugars about 2 hours after a meal and do this after different meals by rotation  Recommended blood sugar levels on waking up is 90-130 and about 2 hours after meal is 130-160  Please bring your blood sugar monitor to each visit, thank you  

## 2015-05-09 ENCOUNTER — Encounter: Payer: Self-pay | Admitting: *Deleted

## 2015-05-09 LAB — HM DIABETES EYE EXAM

## 2015-06-11 ENCOUNTER — Other Ambulatory Visit: Payer: Self-pay | Admitting: Endocrinology

## 2015-07-24 ENCOUNTER — Other Ambulatory Visit: Payer: Self-pay | Admitting: Endocrinology

## 2015-08-22 ENCOUNTER — Other Ambulatory Visit (INDEPENDENT_AMBULATORY_CARE_PROVIDER_SITE_OTHER): Payer: Federal, State, Local not specified - PPO

## 2015-08-22 DIAGNOSIS — E119 Type 2 diabetes mellitus without complications: Secondary | ICD-10-CM

## 2015-08-22 LAB — BASIC METABOLIC PANEL
BUN: 19 mg/dL (ref 6–23)
CO2: 24 mEq/L (ref 19–32)
Calcium: 9 mg/dL (ref 8.4–10.5)
Chloride: 110 mEq/L (ref 96–112)
Creatinine, Ser: 0.95 mg/dL (ref 0.40–1.50)
GFR: 83.75 mL/min (ref >60.00)
Glucose, Bld: 180 mg/dL — ABNORMAL HIGH (ref 70–99)
Potassium: 4 mEq/L (ref 3.5–5.1)
Sodium: 139 mEq/L (ref 135–145)

## 2015-08-22 LAB — HEMOGLOBIN A1C: Hgb A1c MFr Bld: 6.3 % (ref 4.6–6.5)

## 2015-08-25 ENCOUNTER — Ambulatory Visit (INDEPENDENT_AMBULATORY_CARE_PROVIDER_SITE_OTHER): Payer: Federal, State, Local not specified - PPO | Admitting: Endocrinology

## 2015-08-25 ENCOUNTER — Encounter: Payer: Self-pay | Admitting: Endocrinology

## 2015-08-25 VITALS — BP 123/66 | HR 79 | Ht 68.0 in | Wt 162.0 lb

## 2015-08-25 DIAGNOSIS — E7800 Pure hypercholesterolemia, unspecified: Secondary | ICD-10-CM | POA: Diagnosis not present

## 2015-08-25 DIAGNOSIS — E119 Type 2 diabetes mellitus without complications: Secondary | ICD-10-CM | POA: Diagnosis not present

## 2015-08-25 NOTE — Patient Instructions (Signed)
Check blood sugars on waking up 1-2   times a week Also check blood sugars about 2 hours after a meal and do this after different meals by rotation  Recommended blood sugar levels on waking up is 90-130 and about 2 hours after meal is 130-160  Please bring your blood sugar monitor to each visit, thank you  

## 2015-08-25 NOTE — Progress Notes (Signed)
Patient ID: Kenneth Lawson, male   DOB: 25-Mar-1945, 70 y.o.   MRN: [id number]   Reason for Appointment: Diabetes follow-up    History of Present Illness    Diagnosis: date of diagnosis: 1997.   Previous history: He was previously treated with glyburide, Actos and metformin.  Metformin was stopped because he had GI side effects  His blood sugars had been relatively high in the last 3-4 years and A1c had been consistently over 7.5% until his initial consultation He was started on Invokana in 10/13 which had helped modestly. His blood sugars had shown considerable improvement with  adding  Nesina since 2/14 .    He was also on Actos which he had stopped but blood sugars did not appear to worsen. .  The previous HbgA1c levels: 6.4 in 4/14,  7.4 in 12/13, prior range from 7.8-8.3 since 2011   RECENT history:   Oral hypoglycemic drugs: Oseni, Invokana , glyburide  q pm  His blood sugars are overall consistently controlled with A1c 6.3 now  Current blood sugar patterns, management and problems identified:  He has only 2 blood sugars on his download  His blood sugar in the lab was 180, probably right after breakfast.  He is eating somewhat variable meals in the morning  Also his carbohydrate intake and be somewhat inconsistent as he may tend to eat more potatoes and rice at times  However has low readings after meals with only one bedtime reading of 144   His weight is stable  Also he tries to be fairly active with walking daily   Side effects from medications: bloating, gas and GI distress from metformin, even 500 mg, occasional balanitis from Invokana  Monitors blood glucose: Every 2 or 3 days  Glucometer: Accucheck. Blood Glucose readings as above  Hypoglycemia: none recently   Meals: content is Variable, may be getting more dairy products at times; am cheese/ham and toast Physical activity: exercise: He is walking outside, Average 2 miles or more a day  with his dog  Dietician visit: Most recent: 1997 at diagnosis.  Retinal exam: 07/24/13, history of mild nonproliferative retinopathy OD .   Wt Readings from Last 3 Encounters:  08/25/15 162 lb (73.483 kg)  04/26/15 163 lb 3.2 oz (74.027 kg)  01/25/15 162 lb 12.8 oz (73.846 kg)    Lab Results  Component Value Date   HGBA1C 6.3 08/22/2015   HGBA1C 6.4 04/22/2015   HGBA1C 6.9* 01/19/2015   Lab Results  Component Value Date   MICROALBUR <0.7 04/22/2015   LDLCALC 87 04/22/2015   CREATININE 0.95 08/22/2015    Lab on 08/22/2015  Component Date Value Ref Range Status  . Hgb A1c MFr Bld 08/22/2015 6.3  4.6 - 6.5 % Final   Glycemic Control Guidelines for People with Diabetes:Non Diabetic:  <6%Goal of Therapy: <7%Additional Action Suggested:  >8%   . Sodium 08/22/2015 139  135 - 145 mEq/L Final  . Potassium 08/22/2015 4.0  3.5 - 5.1 mEq/L Final  . Chloride 08/22/2015 110  96 - 112 mEq/L Final  . CO2 08/22/2015 24  19 - 32 mEq/L Final  . Glucose, Bld 08/22/2015 180* 70 - 99 mg/dL Final  . BUN 40/98/1191 19  6 - 23 mg/dL Final  . Creatinine, Ser 08/22/2015 0.95  0.40 - 1.50 mg/dL Final  . Calcium 47/82/9562 9.0  8.4 - 10.5 mg/dL Final  . GFR 13/09/6576 83.75  >60.00 mL/min Final      Medication List  This list is accurate as of: 08/25/15 10:55 AM.  Always use your most recent med list.               ACCU-CHEK AVIVA PLUS test strip  Generic drug:  glucose blood  2 (two) times daily.     ACCU-CHEK SOFTCLIX LANCETS lancets     alfuzosin 10 MG 24 hr tablet  Commonly known as:  UROXATRAL  Take 10 mg by mouth daily.     ALPRAZolam 0.5 MG tablet  Commonly known as:  XANAX     atorvastatin 80 MG tablet  Commonly known as:  LIPITOR  TAKE 1 TABLET DAILY     buPROPion 300 MG 24 hr tablet  Commonly known as:  WELLBUTRIN XL  Take 300 mg by mouth daily.     finasteride 5 MG tablet  Commonly known as:  PROSCAR     glyBURIDE 5 MG tablet  Commonly known as:  DIABETA    5 mg.     hydrocortisone-pramoxine 2.5-1 % rectal cream  Commonly known as:  ANALPRAM-HC     INVOKANA 300 MG Tabs tablet  Generic drug:  canagliflozin  TAKE 1 TABLET (300 MG TOTAL) BY MOUTH DAILY.     mometasone 50 MCG/ACT nasal spray  Commonly known as:  NASONEX  USE 2 SPRAYS IN EACH NOSTRIL NASALLY ONCE A DAY AS NEEDED     nystatin cream  Commonly known as:  MYCOSTATIN  APPLY 1 APPLICATION TOPICALLY 2 (TWO) TIMES DAILY.     omeprazole 40 MG capsule  Commonly known as:  PRILOSEC  as needed.     OSENI 25-30 MG Tabs  Generic drug:  Alogliptin-Pioglitazone  TAKE 1 TABLET BY MOUTH EVERY DAY     sildenafil 25 MG tablet  Commonly known as:  VIAGRA  Take 25 mg by mouth daily as needed for erectile dysfunction.        Allergies: No Known Allergies  No past medical history on file.  Past Surgical History  Procedure Laterality Date  . Minor hemorrhoidectomy N/A     Family History  Problem Relation Age of Onset  . Cancer Neg Hx     Social History:  reports that he has quit smoking. His smoking use included Cigarettes. He does not have any smokeless tobacco history on file. His alcohol and drug histories are not on file.  Review of Systems:   Eye Exam last: 3/17 very mild background retinopathy noted  Has had hypercholesterolemia Now taking 80 mg Lipitor  His LDL is below 100 and triglycerides/HDL are also excellent   Lab Results  Component Value Date   CHOL 155 04/22/2015   HDL 50.60 04/22/2015   LDLCALC 87 04/22/2015   LDLDIRECT 134.6 11/18/2013   TRIG 87.0 04/22/2015   CHOLHDL 3 04/22/2015     Diabetic foot exam in 11/16 showed normal monofilament sensation in the toes and plantar surfaces, no skin lesions or ulcers on the feet and normal pedal pulses      Examination:   BP 123/66 mmHg  Pulse 79  Ht 5\' 8"  (1.727 m)  Wt 162 lb (73.483 kg)  BMI 24.64 kg/m2  Body mass index is 24.64 kg/(m^2).     Assesment/Plan:   Diabetes type 2 with BMI  25  His blood sugar control is good overall with a 4 drug regimen and A1cConsistently below 6.5 No hypoglycemia with 10 mg glyburide Renal function normal with continuing Invokana which she is tolerating He is compliant with all his medications  Although he is very consistent with his exercise regimen he thinks he can do some more. Also he cannot remember to check his blood sugars and discussed needing to check postprandial readings  Since he is not having any hypoglycemia with glyburide will continue the same dose  HYPERLIPIDEMIA: Well controlled    Follow-up in 4 months  Patient Instructions  Check blood sugars on waking up 1-2  times a week Also check blood sugars about 2 hours after a meal and do this after different meals by rotation  Recommended blood sugar levels on waking up is 90-130 and about 2 hours after meal is 130-160  Please bring your blood sugar monitor to each visit, thank you      [facility] PaKUMAR,Takasha Vetere 08/25/2015, 10:55 AM   LABS:  Lab on 08/22/2015  Component Date Value Ref Range Status  . Hgb A1c MFr Bld 08/22/2015 6.3  4.6 - 6.5 % Final   Glycemic Control Guidelines for People with Diabetes:Non Diabetic:  <6%Goal of Therapy: <7%Additional Action Suggested:  >8%   . Sodium 08/22/2015 139  135 - 145 mEq/L Final  . Potassium 08/22/2015 4.0  3.5 - 5.1 mEq/L Final  . Chloride 08/22/2015 110  96 - 112 mEq/L Final  . CO2 08/22/2015 24  19 - 32 mEq/L Final  . Glucose, Bld 08/22/2015 180* 70 - 99 mg/dL Final  . BUN 82/95/621306/26/2017 19  6 - 23 mg/dL Final  . Creatinine, Ser 08/22/2015 0.95  0.40 - 1.50 mg/dL Final  . Calcium 08/65/784606/26/2017 9.0  8.4 - 10.5 mg/dL Final  . GFR 96/29/528406/26/2017 83.75  >60.00 mL/min Final

## 2015-10-20 ENCOUNTER — Other Ambulatory Visit: Payer: Self-pay | Admitting: Endocrinology

## 2015-11-23 ENCOUNTER — Other Ambulatory Visit: Payer: Self-pay | Admitting: Endocrinology

## 2015-12-19 ENCOUNTER — Other Ambulatory Visit: Payer: Self-pay | Admitting: Endocrinology

## 2015-12-21 ENCOUNTER — Other Ambulatory Visit (INDEPENDENT_AMBULATORY_CARE_PROVIDER_SITE_OTHER): Payer: Federal, State, Local not specified - PPO

## 2015-12-21 DIAGNOSIS — E119 Type 2 diabetes mellitus without complications: Secondary | ICD-10-CM

## 2015-12-21 LAB — BASIC METABOLIC PANEL
BUN: 19 mg/dL (ref 6–23)
CO2: 25 mEq/L (ref 19–32)
Calcium: 9.1 mg/dL (ref 8.4–10.5)
Chloride: 109 mEq/L (ref 96–112)
Creatinine, Ser: 0.96 mg/dL (ref 0.40–1.50)
GFR: 82.67 mL/min (ref >60.00)
Glucose, Bld: 198 mg/dL — ABNORMAL HIGH (ref 70–99)
Potassium: 3.6 mEq/L (ref 3.5–5.1)
Sodium: 141 mEq/L (ref 135–145)

## 2015-12-21 LAB — LIPID PANEL
Cholesterol: 165 mg/dL (ref 0–200)
HDL: 52.4 mg/dL (ref >39.00)
LDL Cholesterol: 95 mg/dL (ref 0–99)
NonHDL: 112.41
Total CHOL/HDL Ratio: 3
Triglycerides: 85 mg/dL (ref 0.0–149.0)
VLDL: 17 mg/dL (ref 0.0–40.0)

## 2015-12-21 LAB — HEMOGLOBIN A1C: Hgb A1c MFr Bld: 6.6 % — ABNORMAL HIGH (ref 4.6–6.5)

## 2015-12-26 ENCOUNTER — Ambulatory Visit: Payer: Federal, State, Local not specified - PPO | Admitting: Endocrinology

## 2016-01-05 ENCOUNTER — Ambulatory Visit (INDEPENDENT_AMBULATORY_CARE_PROVIDER_SITE_OTHER): Payer: Federal, State, Local not specified - PPO | Admitting: Endocrinology

## 2016-01-05 ENCOUNTER — Encounter: Payer: Self-pay | Admitting: Endocrinology

## 2016-01-05 VITALS — BP 120/70 | HR 77 | Wt 164.6 lb

## 2016-01-05 DIAGNOSIS — E1165 Type 2 diabetes mellitus with hyperglycemia: Secondary | ICD-10-CM

## 2016-01-05 NOTE — Progress Notes (Signed)
Patient ID: Kenneth Lawson, male   DOB: 16-Feb-1946, 70 y.o.   MRN: [id number]   Reason for Appointment: Diabetes follow-up    History of Present Illness    Diagnosis: date of diagnosis: 1997.   Previous history: He was previously treated with glyburide, Actos and metformin.  Metformin was stopped because he had GI side effects  His blood sugars had been relatively high in the last 3-4 years and A1c had been consistently over 7.5% until his initial consultation He was started on Invokana in 10/13 which had helped modestly. His blood sugars had shown considerable improvement with  adding  Nesina since 2/14 .    He was also on Actos which he had stopped but blood sugars did not appear to worsen. .  The previous HbgA1c levels: 6.4 in 4/14,  7.4 in 12/13, prior range from 7.8-8.3 since 2011   RECENT history:   Oral hypoglycemic drugs: Oseni, Invokana 300mg , glyburide 10mg  q pm  His blood sugars are overall consistently controlled but A1c relatively higher at 6.6  Current blood sugar patterns, management and problems identified:  He has again probably checked his blood sugars very sporadically despite reminders on his last visit  Did not bring his monitor for download.  His blood sugars probably are higher after meals, was 198 in the lab  He says he is eating some protein at breakfast but drinking a large glass of juice   No side effects from any of his medications and No hypoglycemia with 10 mg glyburide   His weight is stable  Also he tries to be fairly active with walking daily   Side effects from medications: bloating, gas and GI distress from metformin, even 500 mg, occasional balanitis from Invokana  Monitors blood glucose: Every 2 or 3 days  Glucometer: Accucheck. Blood Glucose readings: He does not remember them  Hypoglycemia: none recently   Physical activity: exercise: He is walking outside, Average 2 miles or more a day with his dog  Dietician visit:  Most recent: 1997 at diagnosis.  Retinal exam: 07/24/13, history of mild nonproliferative retinopathy OD .   Wt Readings from Last 3 Encounters:  01/05/16 164 lb 9.6 oz (74.7 kg)  08/25/15 162 lb (73.5 kg)  04/26/15 163 lb 3.2 oz (74 kg)    Lab Results  Component Value Date   HGBA1C 6.6 (H) 12/21/2015   HGBA1C 6.3 08/22/2015   HGBA1C 6.4 04/22/2015   Lab Results  Component Value Date   MICROALBUR <0.7 04/22/2015   LDLCALC 95 12/21/2015   CREATININE 0.96 12/21/2015    No visits with results within 1 Week(s) from this visit.  Latest known visit with results is:  Lab on 12/21/2015  Component Date Value Ref Range Status  . Hgb A1c MFr Bld 12/21/2015 6.6* 4.6 - 6.5 % Final  . Sodium 12/21/2015 141  135 - 145 mEq/L Final  . Potassium 12/21/2015 3.6  3.5 - 5.1 mEq/L Final  . Chloride 12/21/2015 109  96 - 112 mEq/L Final  . CO2 12/21/2015 25  19 - 32 mEq/L Final  . Glucose, Bld 12/21/2015 198* 70 - 99 mg/dL Final  . BUN 40/98/1191 19  6 - 23 mg/dL Final  . Creatinine, Ser 12/21/2015 0.96  0.40 - 1.50 mg/dL Final  . Calcium 47/82/9562 9.1  8.4 - 10.5 mg/dL Final  . GFR 13/09/6576 82.67  >60.00 mL/min Final  . Cholesterol 12/21/2015 165  0 - 200 mg/dL Final  . Triglycerides 12/21/2015 85.0  0.0 - 149.0 mg/dL Final  . HDL 09/81/191410/25/2017 52.40  >39.00 mg/dL Final  . VLDL 78/29/562110/25/2017 17.0  0.0 - 40.0 mg/dL Final  . LDL Cholesterol 12/21/2015 95  0 - 99 mg/dL Final  . Total CHOL/HDL Ratio 12/21/2015 3   Final  . NonHDL 12/21/2015 112.41   Final      Medication List       Accurate as of 01/05/16  4:06 PM. Always use your most recent med list.          ACCU-CHEK AVIVA PLUS test strip Generic drug:  glucose blood 2 (two) times daily.   ACCU-CHEK SOFTCLIX LANCETS lancets   alfuzosin 10 MG 24 hr tablet Commonly known as:  UROXATRAL Take 10 mg by mouth daily.   ALPRAZolam 0.5 MG tablet Commonly known as:  XANAX   atorvastatin 80 MG tablet Commonly known as:  LIPITOR TAKE 1  TABLET DAILY   buPROPion 300 MG 24 hr tablet Commonly known as:  WELLBUTRIN XL Take 300 mg by mouth daily.   finasteride 5 MG tablet Commonly known as:  PROSCAR   glyBURIDE 5 MG tablet Commonly known as:  DIABETA 5 mg.   hydrocortisone-pramoxine 2.5-1 % rectal cream Commonly known as:  ANALPRAM-HC   INVOKANA 300 MG Tabs tablet Generic drug:  canagliflozin TAKE 1 TABLET EVERY DAY   mometasone 50 MCG/ACT nasal spray Commonly known as:  NASONEX USE 2 SPRAYS IN EACH NOSTRIL NASALLY ONCE A DAY AS NEEDED   nystatin cream Commonly known as:  MYCOSTATIN APPLY 1 APPLICATION TOPICALLY 2 (TWO) TIMES DAILY.   omeprazole 40 MG capsule Commonly known as:  PRILOSEC as needed.   Alogliptin-Pioglitazone 25-30 MG Tabs Take 25 mg by mouth daily.   OSENI 25-30 MG Tabs Generic drug:  Alogliptin-Pioglitazone TAKE 1 TABLET BY MOUTH EVERY DAY   OSENI 25-30 MG Tabs Generic drug:  Alogliptin-Pioglitazone TAKE 1 TABLET BY MOUTH EVERY DAY   sildenafil 25 MG tablet Commonly known as:  VIAGRA Take 25 mg by mouth daily as needed for erectile dysfunction.       Allergies: No Known Allergies  No past medical history on file.  Past Surgical History:  Procedure Laterality Date  . MINOR HEMORRHOIDECTOMY N/A     Family History  Problem Relation Age of Onset  . Cancer Neg Hx     Social History:  reports that he has quit smoking. His smoking use included Cigarettes. He does not have any smokeless tobacco history on file. His alcohol and drug histories are not on file.  Review of Systems:   Eye Exam last: 3/17 very mild background retinopathy noted  Has had hypercholesterolemia Has been taking 80 mg Lipitor  His LDL is below 100 and triglycerides/HDL are also excellent   Lab Results  Component Value Date   CHOL 165 12/21/2015   HDL 52.40 12/21/2015   LDLCALC 95 12/21/2015   LDLDIRECT 134.6 11/18/2013   TRIG 85.0 12/21/2015   CHOLHDL 3 12/21/2015     Diabetic foot exam  in 11/17 showed normal monofilament sensation in the toes and plantar surfaces, no skin lesions or ulcers on the feet and normal pedal pulses   He had influenza vaccine at work   Examination:   BP 120/70   Pulse 77   Wt 164 lb 9.6 oz (74.7 kg)   SpO2 97%   BMI 25.03 kg/m   Body mass index is 25.03 kg/m.    Diabetic Foot Exam - Simple   Simple Foot Form Diabetic  Foot exam was performed with the following findings:  Yes 01/05/2016  4:06 PM  Visual Inspection No deformities, no ulcerations, no other skin breakdown bilaterally:  Yes Sensation Testing Intact to touch and monofilament testing bilaterally:  Yes Pulse Check Posterior Tibialis and Dorsalis pulse intact bilaterally:  Yes Comments      Assesment/Plan:   Diabetes type 2 with BMI 25   His blood sugar control is good overall with a 4 drug regimen  However with his probably be not consistent with diet and A1c is relatively higher at 6.6 Glucose after breakfast in the lab was 198  Discussed that if he can check his sugars more consistently which he is not doing currently he will get a better idea of his diabetes control and also comply with diet better Need to cut back his juice at breakfast down to 4 ounces only Bring monitor on the next visit for follow-up  HYPERLIPIDEMIA: Well controlled with LDL below 100   Follow-up in 3 months   There are no Patient Instructions on file for this visit.   Jasan Doughtie 01/05/2016, 4:06 PM   LABS:  No visits with results within 1 Week(s) from this visit.  Latest known visit with results is:  Lab on 12/21/2015  Component Date Value Ref Range Status  . Hgb A1c MFr Bld 12/21/2015 6.6* 4.6 - 6.5 % Final  . Sodium 12/21/2015 141  135 - 145 mEq/L Final  . Potassium 12/21/2015 3.6  3.5 - 5.1 mEq/L Final  . Chloride 12/21/2015 109  96 - 112 mEq/L Final  . CO2 12/21/2015 25  19 - 32 mEq/L Final  . Glucose, Bld 12/21/2015 198* 70 - 99 mg/dL Final  . BUN 40/98/119110/25/2017 19  6 - 23 mg/dL  Final  . Creatinine, Ser 12/21/2015 0.96  0.40 - 1.50 mg/dL Final  . Calcium 47/82/956210/25/2017 9.1  8.4 - 10.5 mg/dL Final  . GFR 13/08/657810/25/2017 82.67  >60.00 mL/min Final  . Cholesterol 12/21/2015 165  0 - 200 mg/dL Final  . Triglycerides 12/21/2015 85.0  0.0 - 149.0 mg/dL Final  . HDL 46/96/295210/25/2017 52.40  >39.00 mg/dL Final  . VLDL 84/13/244010/25/2017 17.0  0.0 - 40.0 mg/dL Final  . LDL Cholesterol 12/21/2015 95  0 - 99 mg/dL Final  . Total CHOL/HDL Ratio 12/21/2015 3   Final  . NonHDL 12/21/2015 112.41   Final

## 2016-01-20 ENCOUNTER — Other Ambulatory Visit: Payer: Self-pay | Admitting: Endocrinology

## 2016-01-23 ENCOUNTER — Telehealth: Payer: Self-pay | Admitting: Endocrinology

## 2016-01-23 NOTE — Telephone Encounter (Signed)
Patient ask if he can get it in the generic brand of medication Alogliptin-Pioglitazone 25-30 MG TABS  It will only cost him 15.00. Please advise  [facility] [place] [place] - [id number] AT [address] to [facility] [employer] [facility] [phone] (Phone) [phone] (Fax)

## 2016-01-25 NOTE — Telephone Encounter (Signed)
Sent prescription for pioglitazone 30 mg daily and alogliptin 25 mg daily separately

## 2016-01-26 ENCOUNTER — Other Ambulatory Visit: Payer: Self-pay

## 2016-01-26 MED ORDER — PIOGLITAZONE HCL 30 MG PO TABS: 30.0000 mg | ORAL_TABLET | Freq: Every day | ORAL | 3 refills | Status: DC

## 2016-01-26 MED ORDER — ALOGLIPTIN BENZOATE 25 MG PO TABS: 1.0000 | ORAL_TABLET | Freq: Every day | ORAL | 3 refills | Status: DC

## 2016-01-26 NOTE — Telephone Encounter (Signed)
Filled 01/26/16- ordered both medications separately

## 2016-02-24 ENCOUNTER — Telehealth: Payer: Self-pay | Admitting: Endocrinology

## 2016-02-24 NOTE — Telephone Encounter (Signed)
Invokana needs to be reauthorized please

## 2016-03-01 NOTE — Telephone Encounter (Signed)
auth submitted per Aundra MilletMegan, awaiting determination  Pt is aware  Please call pt once the determination has arrived

## 2016-03-06 ENCOUNTER — Telehealth: Payer: Self-pay

## 2016-03-06 NOTE — Telephone Encounter (Signed)
Jardiance 25 mg daily

## 2016-03-06 NOTE — Telephone Encounter (Signed)
Spoke with patient and he stated that the pharmacy is out of Invokana and he also stated he was told that the manufacturer is also out - patient would like to know if you can prescribe something equivalent to the Invokana until the pharmacy is able to get some in- please advise

## 2016-03-07 ENCOUNTER — Other Ambulatory Visit: Payer: Self-pay

## 2016-03-07 MED ORDER — EMPAGLIFLOZIN 25 MG PO TABS: 25.0000 mg | ORAL_TABLET | Freq: Every day | ORAL | 3 refills | Status: DC

## 2016-03-07 NOTE — Telephone Encounter (Signed)
jardiance ordered 03/07/16

## 2016-03-26 ENCOUNTER — Telehealth: Payer: Self-pay | Admitting: Endocrinology

## 2016-03-26 NOTE — Telephone Encounter (Signed)
Calling on the status of a PA in medication empagliflozin (JARDIANCE) 25 MG TABS tablet  [id number] - [place], [place] - [address] AT [address] [phone] (Phone) [phone] (Fax)

## 2016-03-30 ENCOUNTER — Other Ambulatory Visit: Payer: Self-pay | Admitting: Endocrinology

## 2016-04-03 ENCOUNTER — Other Ambulatory Visit (INDEPENDENT_AMBULATORY_CARE_PROVIDER_SITE_OTHER): Payer: Federal, State, Local not specified - PPO

## 2016-04-03 DIAGNOSIS — E1165 Type 2 diabetes mellitus with hyperglycemia: Secondary | ICD-10-CM | POA: Diagnosis not present

## 2016-04-03 LAB — URINALYSIS, ROUTINE W REFLEX MICROSCOPIC
Bilirubin Urine: NEGATIVE
Hgb urine dipstick: NEGATIVE
Ketones, ur: NEGATIVE
Leukocytes, UA: NEGATIVE
Nitrite: NEGATIVE
RBC / HPF: NONE SEEN (ref 0–?)
Specific Gravity, Urine: 1.015 (ref 1.000–1.030)
Total Protein, Urine: NEGATIVE
Urine Glucose: >=1000 — AB
Urobilinogen, UA: 0.2 (ref 0.0–1.0)
WBC, UA: NONE SEEN (ref 0–?)
pH: 5.5 (ref 5.0–8.0)

## 2016-04-03 LAB — BASIC METABOLIC PANEL
BUN: 21 mg/dL (ref 6–23)
CO2: 28 mEq/L (ref 19–32)
Calcium: 9.2 mg/dL (ref 8.4–10.5)
Chloride: 106 mEq/L (ref 96–112)
Creatinine, Ser: 1.08 mg/dL (ref 0.40–1.50)
GFR: 72.1 mL/min (ref >60.00)
Glucose, Bld: 189 mg/dL — ABNORMAL HIGH (ref 70–99)
Potassium: 4 mEq/L (ref 3.5–5.1)
Sodium: 139 mEq/L (ref 135–145)

## 2016-04-03 LAB — MICROALBUMIN / CREATININE URINE RATIO
Creatinine,U: 86.6 mg/dL
Microalb Creat Ratio: 0.8 mg/g (ref 0.0–30.0)
Microalb, Ur: <0.7 mg/dL (ref 0.0–1.9)

## 2016-04-03 LAB — HEMOGLOBIN A1C: Hgb A1c MFr Bld: 6.7 % — ABNORMAL HIGH (ref 4.6–6.5)

## 2016-04-06 ENCOUNTER — Encounter: Payer: Self-pay | Admitting: Endocrinology

## 2016-04-06 ENCOUNTER — Ambulatory Visit (INDEPENDENT_AMBULATORY_CARE_PROVIDER_SITE_OTHER): Payer: Federal, State, Local not specified - PPO | Admitting: Endocrinology

## 2016-04-06 VITALS — BP 130/68 | HR 88 | Ht 68.0 in | Wt 167.0 lb

## 2016-04-06 DIAGNOSIS — E1165 Type 2 diabetes mellitus with hyperglycemia: Secondary | ICD-10-CM | POA: Diagnosis not present

## 2016-04-06 MED ORDER — ALOGLIPTIN-PIOGLITAZONE 25-30 MG PO TABS: 25.0000 mg | ORAL_TABLET | Freq: Every day | ORAL | 3 refills | Status: DC

## 2016-04-06 NOTE — Progress Notes (Signed)
Patient ID: Kenneth Lawson, male   DOB: May 21, 1945, 71 y.o.   MRN: [id number]   Reason for Appointment: Diabetes follow-up    History of Present Illness    Diagnosis: date of diagnosis: 1997.   Previous history: He was previously treated with glyburide, Actos and metformin.  Metformin was stopped because he had GI side effects  His blood sugars had been relatively high in the last 3-4 years and A1c had been consistently over 7.5% until his initial consultation He was started on Invokana in 10/13 which had helped modestly. His blood sugars had shown considerable improvement with  adding  Nesina since 2/14 .    He was also on Actos which he had stopped but blood sugars did not appear to worsen. .  The previous HbgA1c levels: 6.4 in 4/14,  7.4 in 12/13, prior range from 7.8-8.3 since 2011   RECENT history:   Oral hypoglycemic drugs: Oseni, Invokana 300mg , glyburide 10mg  q pm  His blood sugars are overall consistently controlled with A1c now 6.7 although has been down 6.3 previously  Current blood sugar patterns, management and problems identified:  He has not brought his monitor for download again  He thinks he is checking readings mostly in the mornings and occasionally after meals with readings as below  Lab glucose was 189 and he thinks that blood sugars maybe higher when he is getting more carbohydrate including at breakfast  He is trying to walk as much as possible, mostly outside and up to 1.5 miles average, he monitors this on his phone   He did have some difficulty getting Invokana at his pharmacy and Jardiance was substituted but this is not covered  He thinks he can start back on Invokana and afford to buy this  Also now he is wanting to switch to generic Oseni   Side effects from medications: bloating, gas and GI distress from metformin, even 500 mg, occasional balanitis from Invokana  Monitors blood glucose: Every 2 or 3 days  Glucometer: Accucheck.  Blood Glucose readings by recall:  Am  90s; PC 170   Hypoglycemia: none recently   Physical activity: exercise: He is walking outside, avg 1.5 miles daily with his dog  Dietician visit: Most recent: 1997 at diagnosis.  Retinal exam: 07/24/13, history of mild nonproliferative retinopathy OD .   Wt Readings from Last 3 Encounters:  04/06/16 167 lb (75.8 kg)  01/05/16 164 lb 9.6 oz (74.7 kg)  08/25/15 162 lb (73.5 kg)    Lab Results  Component Value Date   HGBA1C 6.7 (H) 04/03/2016   HGBA1C 6.6 (H) 12/21/2015   HGBA1C 6.3 08/22/2015   Lab Results  Component Value Date   MICROALBUR <0.7 04/03/2016   LDLCALC 95 12/21/2015   CREATININE 1.08 04/03/2016    Lab on 04/03/2016  Component Date Value Ref Range Status  . Hgb A1c MFr Bld 04/03/2016 6.7* 4.6 - 6.5 % Final  . Sodium 04/03/2016 139  135 - 145 mEq/L Final  . Potassium 04/03/2016 4.0  3.5 - 5.1 mEq/L Final  . Chloride 04/03/2016 106  96 - 112 mEq/L Final  . CO2 04/03/2016 28  19 - 32 mEq/L Final  . Glucose, Bld 04/03/2016 189* 70 - 99 mg/dL Final  . BUN 29/52/8413 21  6 - 23 mg/dL Final  . Creatinine, Ser 04/03/2016 1.08  0.40 - 1.50 mg/dL Final  . Calcium 24/40/1027 9.2  8.4 - 10.5 mg/dL Final  . GFR 25/36/6440 72.10  >60.00 mL/min Final  .  Microalb, Ur 04/03/2016 <0.7  0.0 - 1.9 mg/dL Final  . Creatinine,U 16/10/960402/07/2016 86.6  mg/dL Final  . Microalb Creat Ratio 04/03/2016 0.8  0.0 - 30.0 mg/g Final  . Color, Urine 04/03/2016 YELLOW  Yellow;Lt. Yellow Final  . APPearance 04/03/2016 CLEAR  Clear Final  . Specific Gravity, Urine 04/03/2016 1.015  1.000 - 1.030 Final  . pH 04/03/2016 5.5  5.0 - 8.0 Final  . Total Protein, Urine 04/03/2016 NEGATIVE  Negative Final  . Urine Glucose 04/03/2016 >=1000* Negative Final  . Ketones, ur 04/03/2016 NEGATIVE  Negative Final  . Bilirubin Urine 04/03/2016 NEGATIVE  Negative Final  . Hgb urine dipstick 04/03/2016 NEGATIVE  Negative Final  . Urobilinogen, UA 04/03/2016 0.2  0.0 - 1.0  Final  . Leukocytes, UA 04/03/2016 NEGATIVE  Negative Final  . Nitrite 04/03/2016 NEGATIVE  Negative Final  . WBC, UA 04/03/2016 none seen  0-2/hpf Final  . RBC / HPF 04/03/2016 none seen  0-2/hpf Final    Allergies as of 04/06/2016   No Known Allergies     Medication List       Accurate as of 04/06/16 11:59 PM. Always use your most recent med list.          ACCU-CHEK AVIVA PLUS test strip Generic drug:  glucose blood 2 (two) times daily.   ACCU-CHEK SOFTCLIX LANCETS lancets   alfuzosin 10 MG 24 hr tablet Commonly known as:  UROXATRAL Take 10 mg by mouth daily.   Alogliptin-Pioglitazone 25-30 MG Tabs Take 25 mg by mouth daily.   ALPRAZolam 0.5 MG tablet Commonly known as:  XANAX   atorvastatin 80 MG tablet Commonly known as:  LIPITOR TAKE 1 TABLET DAILY   buPROPion 300 MG 24 hr tablet Commonly known as:  WELLBUTRIN XL Take 300 mg by mouth daily.   finasteride 5 MG tablet Commonly known as:  PROSCAR   glyBURIDE 5 MG tablet Commonly known as:  DIABETA 5 mg.   hydrocortisone-pramoxine 2.5-1 % rectal cream Commonly known as:  ANALPRAM-HC   INVOKANA 300 MG Tabs tablet Generic drug:  canagliflozin TAKE 1 TABLET EVERY DAY   mometasone 50 MCG/ACT nasal spray Commonly known as:  NASONEX USE 2 SPRAYS IN EACH NOSTRIL NASALLY ONCE A DAY AS NEEDED   nystatin cream Commonly known as:  MYCOSTATIN APPLY 1 APPLICATION TOPICALLY 2 (TWO) TIMES DAILY.   omeprazole 40 MG capsule Commonly known as:  PRILOSEC as needed.   sildenafil 25 MG tablet Commonly known as:  VIAGRA Take 25 mg by mouth daily as needed for erectile dysfunction.       Allergies: No Known Allergies  No past medical history on file.  Past Surgical History:  Procedure Laterality Date  . MINOR HEMORRHOIDECTOMY N/A     Family History  Problem Relation Age of Onset  . Cancer Neg Hx     Social History:  reports that he has quit smoking. His smoking use included Cigarettes. He has never used  smokeless tobacco. His alcohol and drug histories are not on file.  Review of Systems:   Eye Exam last: 3/17 With mild background retinopathy noted  Has had hypercholesterolemia Has been taking 80 mg Lipitor  His LDL is below 100 and triglycerides/HDL are also excellent   Lab Results  Component Value Date   CHOL 165 12/21/2015   HDL 52.40 12/21/2015   LDLCALC 95 12/21/2015   LDLDIRECT 134.6 11/18/2013   TRIG 85.0 12/21/2015   CHOLHDL 3 12/21/2015     Diabetic foot exam  in 11/17 showed normal monofilament sensation in the toes and plantar surfaces, no skin lesions or ulcers on the feet and normal pedal pulses      Examination:   BP 130/68   Pulse 88   Ht 5\' 8"  (1.727 m)   Wt 167 lb (75.8 kg)   SpO2 97%   BMI 25.39 kg/m   Body mass index is 25.39 kg/m.     Assesment/Plan:   Diabetes type 2 with BMI 25 See history of present illness for detailed discussion of current diabetes management, blood sugar patterns and problems identified  Although he has had difficulty getting his Invokana consistently his blood sugars are overall fairly well controlled His A1c is reasonably good at 6.7 He does not always control carbohydrates and is trying to be active with walking his weight is down slightly  Encouraged him to start checking his blood sugars more often after meals He will try to be consistent with carbohydrate control and limiting fruit juices Continue Invokana as this is working safely and no change in renal function No other change in regimen including glyburide.  No history of hypertension    Follow-up in 3 months   Patient Instructions  Check blood sugars on waking up 2x weekly  Also check blood sugars about 2 hours after a meal and do this after different meals by rotation  Recommended blood sugar levels on waking up is 90-130 and about 2 hours after meal is 130-160  Please bring your blood sugar monitor to each visit, thank  you     [facility] 04/08/2016, 8:51 PM   LABS:

## 2016-04-06 NOTE — Patient Instructions (Signed)
Check blood sugars on waking up  2x weekly  Also check blood sugars about 2 hours after a meal and do this after different meals by rotation  Recommended blood sugar levels on waking up is 90-130 and about 2 hours after meal is 130-160  Please bring your blood sugar monitor to each visit, thank you  

## 2016-04-09 NOTE — Telephone Encounter (Signed)
Placed on Kenneth Lawson's desk, he advised to send in [place] but previous call note states that they are out of stock. Needed Jardiance submitted. Will await to see what he would like to do.

## 2016-05-02 ENCOUNTER — Other Ambulatory Visit: Payer: Self-pay | Admitting: Endocrinology

## 2016-06-29 ENCOUNTER — Other Ambulatory Visit (INDEPENDENT_AMBULATORY_CARE_PROVIDER_SITE_OTHER): Payer: Federal, State, Local not specified - PPO

## 2016-06-29 DIAGNOSIS — E1165 Type 2 diabetes mellitus with hyperglycemia: Secondary | ICD-10-CM | POA: Diagnosis not present

## 2016-06-29 LAB — HEMOGLOBIN A1C: Hgb A1c MFr Bld: 6.7 % — ABNORMAL HIGH (ref 4.6–6.5)

## 2016-06-29 LAB — BASIC METABOLIC PANEL
BUN: 20 mg/dL (ref 6–23)
CO2: 26 mEq/L (ref 19–32)
Calcium: 9 mg/dL (ref 8.4–10.5)
Chloride: 108 mEq/L (ref 96–112)
Creatinine, Ser: 0.99 mg/dL (ref 0.40–1.50)
GFR: 79.66 mL/min (ref >60.00)
Glucose, Bld: 137 mg/dL — ABNORMAL HIGH (ref 70–99)
Potassium: 4 mEq/L (ref 3.5–5.1)
Sodium: 139 mEq/L (ref 135–145)

## 2016-07-04 ENCOUNTER — Ambulatory Visit (INDEPENDENT_AMBULATORY_CARE_PROVIDER_SITE_OTHER): Payer: Federal, State, Local not specified - PPO | Admitting: Endocrinology

## 2016-07-04 ENCOUNTER — Encounter: Payer: Self-pay | Admitting: Endocrinology

## 2016-07-04 VITALS — BP 138/76 | HR 74 | Ht 68.0 in | Wt 162.6 lb

## 2016-07-04 DIAGNOSIS — E119 Type 2 diabetes mellitus without complications: Secondary | ICD-10-CM | POA: Diagnosis not present

## 2016-07-04 NOTE — Progress Notes (Signed)
Patient ID: Kenneth Lawson, male   DOB: 04-Mar-1945, 71 y.o.   MRN: [id number]   Reason for Appointment: Diabetes follow-up    History of Present Illness    Diagnosis: date of diagnosis: 1997.   Previous history: He was previously treated with glyburide, Actos and metformin.  Metformin was stopped because he had GI side effects  His blood sugars had been relatively high in the last 3-4 years and A1c had been consistently over 7.5% until his initial consultation He was started on Invokana in 10/13 which had helped modestly. His blood sugars had shown considerable improvement with  adding  Nesina since 2/14 .    He was also on Actos which he had stopped but blood sugars did not appear to worsen. .  The previous HbgA1c levels: 6.4 in 4/14,  7.4 in 12/13, prior range from 7.8-8.3 since 2011   RECENT history:   Oral hypoglycemic drugs: Oseni 25/30, Invokana 300mg , glyburide 10mg  q pm  His blood sugars are overall consistently controlled with A1c now 6.7 although has been down 6.3 previously  Current blood sugar patterns, management and problems identified:  Although he has lost weight he thinks this may be from various factors and not just diet  He now has no trouble taking all his medications and getting them filled from his pharmacy  Even though he is taking all 10 mg of glyburide in the evening he does not tend to have any hypoglycemia, recently lowest blood sugar 80 before supper  He has been trying to walk further and more regularly now  Again his diet can be somewhat variable at times based on his stress level and depression   Side effects from medications: bloating, gas and GI distress from metformin, even 500 mg, occasional balanitis from Invokana  Monitors blood glucose: Every 2 or 3 days  Glucometer: Accucheck. Blood Glucose readings by download:  AVERAGE 128 with range 80-1 88, has only 6 readings, some morning and some afternoon  Hypoglycemia: Very rare     Physical activity: exercise: He is walking outside, avg 2.5 miles daily with his dog  Dietician visit: Most recent: 1997 at diagnosis.  .   Wt Readings from Last 3 Encounters:  07/04/16 162 lb 9.6 oz (73.8 kg)  04/06/16 167 lb (75.8 kg)  01/05/16 164 lb 9.6 oz (74.7 kg)    Lab Results  Component Value Date   HGBA1C 6.7 (H) 06/29/2016   HGBA1C 6.7 (H) 04/03/2016   HGBA1C 6.6 (H) 12/21/2015   Lab Results  Component Value Date   MICROALBUR <0.7 04/03/2016   LDLCALC 95 12/21/2015   CREATININE 0.99 06/29/2016    Lab on 06/29/2016  Component Date Value Ref Range Status  . Hgb A1c MFr Bld 06/29/2016 6.7* 4.6 - 6.5 % Final  . Sodium 06/29/2016 139  135 - 145 mEq/L Final  . Potassium 06/29/2016 4.0  3.5 - 5.1 mEq/L Final  . Chloride 06/29/2016 108  96 - 112 mEq/L Final  . CO2 06/29/2016 26  19 - 32 mEq/L Final  . Glucose, Bld 06/29/2016 137* 70 - 99 mg/dL Final  . BUN 40/98/1191 20  6 - 23 mg/dL Final  . Creatinine, Ser 06/29/2016 0.99  0.40 - 1.50 mg/dL Final  . Calcium 47/82/9562 9.0  8.4 - 10.5 mg/dL Final  . GFR 13/09/6576 79.66  >60.00 mL/min Final    Allergies as of 07/04/2016   No Known Allergies     Medication List  Accurate as of 07/04/16  1:27 PM. Always use your most recent med list.          ACCU-CHEK AVIVA PLUS test strip Generic drug:  glucose blood 2 (two) times daily.   ACCU-CHEK SOFTCLIX LANCETS lancets   alfuzosin 10 MG 24 hr tablet Commonly known as:  UROXATRAL Take 10 mg by mouth daily.   Alogliptin-Pioglitazone 25-30 MG Tabs Take 25 mg by mouth daily.   ALPRAZolam 0.5 MG tablet Commonly known as:  XANAX   atorvastatin 80 MG tablet Commonly known as:  LIPITOR TAKE 1 TABLET DAILY   buPROPion 300 MG 24 hr tablet Commonly known as:  WELLBUTRIN XL Take 300 mg by mouth daily.   finasteride 5 MG tablet Commonly known as:  PROSCAR   glyBURIDE 5 MG tablet Commonly known as:  DIABETA 5 mg. Takes 2 daily   hydrocortisone-pramoxine  2.5-1 % rectal cream Commonly known as:  ANALPRAM-HC   INVOKANA 300 MG Tabs tablet Generic drug:  canagliflozin TAKE 1 TABLET BY MOUTH EVERY DAY   mometasone 50 MCG/ACT nasal spray Commonly known as:  NASONEX USE 2 SPRAYS IN EACH NOSTRIL NASALLY ONCE A DAY AS NEEDED   nystatin cream Commonly known as:  MYCOSTATIN APPLY 1 APPLICATION TOPICALLY 2 (TWO) TIMES DAILY.   omeprazole 40 MG capsule Commonly known as:  PRILOSEC as needed.   sildenafil 25 MG tablet Commonly known as:  VIAGRA Take 25 mg by mouth daily as needed for erectile dysfunction.       Allergies: No Known Allergies  No past medical history on file.  Past Surgical History:  Procedure Laterality Date  . MINOR HEMORRHOIDECTOMY N/A     Family History  Problem Relation Age of Onset  . Cancer Neg Hx     Social History:  reports that he has quit smoking. His smoking use included Cigarettes. He has never used smokeless tobacco. His alcohol and drug histories are not on file.  Review of Systems:   Eye Exam last: 3/18 With no retinopathy noted  Has had hypercholesterolemia Has been taking 80 mg Lipitor  His LDL Has been below 100 and triglycerides/HDL are also excellent   Lab Results  Component Value Date   CHOL 165 12/21/2015   HDL 52.40 12/21/2015   LDLCALC 95 12/21/2015   LDLDIRECT 134.6 11/18/2013   TRIG 85.0 12/21/2015   CHOLHDL 3 12/21/2015     Diabetic foot exam in 11/17 showed normal monofilament sensation in the toes and plantar surfaces, no skin lesions or ulcers on the feet and normal pedal pulses      Examination:   BP 138/76   Pulse 74   Ht 5\' 8"  (1.727 m)   Wt 162 lb 9.6 oz (73.8 kg)   SpO2 97%   BMI 24.72 kg/m   Body mass index is 24.72 kg/m.     Assesment/Plan:   Diabetes type 2 with BMI 25 See history of present illness for detailed discussion of current diabetes management, blood sugar patterns and problems identified  His A1c is reasonably good at 6.7 and  stable He is walking and keeping his weight down Although he is somewhat inconsistent with glucose monitoring he does not have any consistently high readings, does need to check more readings after meals especially supper He will continue his regimen except try to reduce his glyburide to 7.5 mg at dinnertime to prevent potential hypoglycemia during the day specially since he is going to be more active   Follow-up in 4 months  Patient Instructions  Check blood sugars on waking up  2x weekly  Also check blood sugars about 2 hours after a meal and do this after different meals by rotation  Recommended blood sugar levels on waking up is 90-130 and about 2 hours after meal is 130-160  Please bring your blood sugar monitor to each visit, thank you  Take 1 1/2 Glyburide in pm     Max Romano 07/04/2016, 1:27 PM

## 2016-07-04 NOTE — Patient Instructions (Addendum)
Check blood sugars on waking up  2x weekly  Also check blood sugars about 2 hours after a meal and do this after different meals by rotation  Recommended blood sugar levels on waking up is 90-130 and about 2 hours after meal is 130-160  Please bring your blood sugar monitor to each visit, thank you  Take 1 1/2 Glyburide in pm

## 2016-10-30 ENCOUNTER — Other Ambulatory Visit (INDEPENDENT_AMBULATORY_CARE_PROVIDER_SITE_OTHER): Payer: Federal, State, Local not specified - PPO

## 2016-10-30 DIAGNOSIS — E119 Type 2 diabetes mellitus without complications: Secondary | ICD-10-CM

## 2016-10-30 LAB — BASIC METABOLIC PANEL
BUN: 30 mg/dL — ABNORMAL HIGH (ref 6–23)
CO2: 25 mEq/L (ref 19–32)
Calcium: 9.2 mg/dL (ref 8.4–10.5)
Chloride: 105 mEq/L (ref 96–112)
Creatinine, Ser: 1.08 mg/dL (ref 0.40–1.50)
GFR: 71.98 mL/min (ref >60.00)
Glucose, Bld: 269 mg/dL — ABNORMAL HIGH (ref 70–99)
Potassium: 4 mEq/L (ref 3.5–5.1)
Sodium: 138 mEq/L (ref 135–145)

## 2016-10-30 LAB — LIPID PANEL
Cholesterol: 165 mg/dL (ref 0–200)
HDL: 44.1 mg/dL (ref >39.00)
LDL Cholesterol: 103 mg/dL — ABNORMAL HIGH (ref 0–99)
NonHDL: 120.8
Total CHOL/HDL Ratio: 4
Triglycerides: 90 mg/dL (ref 0.0–149.0)
VLDL: 18 mg/dL (ref 0.0–40.0)

## 2016-10-30 LAB — HEMOGLOBIN A1C: Hgb A1c MFr Bld: 6.7 % — ABNORMAL HIGH (ref 4.6–6.5)

## 2016-11-02 ENCOUNTER — Other Ambulatory Visit: Payer: Self-pay | Admitting: Endocrinology

## 2016-11-05 ENCOUNTER — Ambulatory Visit: Payer: Federal, State, Local not specified - PPO | Admitting: Endocrinology

## 2016-11-06 ENCOUNTER — Ambulatory Visit (INDEPENDENT_AMBULATORY_CARE_PROVIDER_SITE_OTHER): Payer: Federal, State, Local not specified - PPO | Admitting: Endocrinology

## 2016-11-06 ENCOUNTER — Encounter: Payer: Self-pay | Admitting: Endocrinology

## 2016-11-06 VITALS — BP 124/70 | HR 94 | Ht 68.0 in | Wt 165.8 lb

## 2016-11-06 DIAGNOSIS — Z23 Encounter for immunization: Secondary | ICD-10-CM | POA: Diagnosis not present

## 2016-11-06 DIAGNOSIS — E1165 Type 2 diabetes mellitus with hyperglycemia: Secondary | ICD-10-CM | POA: Diagnosis not present

## 2016-11-06 NOTE — Progress Notes (Signed)
Patient ID: Kenneth Lawson, male   DOB: [birth year], 71 y.o.   MRN: [id number]   Reason for Appointment: Diabetes follow-up    History of Present Illness    Diagnosis: date of diagnosis: 1997.   Previous history: He was previously treated with glyburide, Actos and metformin.  Metformin was stopped because he had GI side effects  His blood sugars had been relatively high in the last 3-4 years and A1c had been consistently over 7.5% until his initial consultation He was started on Invokana in 10/13 which had helped modestly. His blood sugars had shown considerable improvement with  adding  Nesina since 2/14 .    He was also on Actos which he had stopped but blood sugars did not appear to worsen. .  The previous HbgA1c levels: 6.4 in 4/14,  7.4 in 12/13, prior range from 7.8-8.3 since 2011   RECENT history:   Oral hypoglycemic drugs: Oseni 25/30, Invokana , glyburide  q pm  His blood sugars are overall consistently controlled with A1c again 6.7 although has been down 6.3 previously  Current blood sugar patterns, management and problems identified:  He has been very irregular with checking his blood sugars  He has checked blood sugars only about 5 days in the last month  He has a couple of days where the blood sugars were high and also high on the lab and he came in the same day  He thinks that most of his significantly high morning readings are related to going off his diet the night before and eating more carbohydrate and relatively higher fat meals  However he does not think he is going off his diet very often, mostly when he is having anxiety or depression issues which is better now  Although he has lost weight he thinks this may be from various factors and not just diet  He now has no trouble taking all his medications and getting them filled from his pharmacy  Even though he is taking all   Still taking 10 mg of glyburide in the evening but he does not  tend to have any hypoglycemia  He has been trying to walk although not as much    Side effects from medications: bloating, gas and GI distress from metformin, even 500 mg, occasional balanitis from Invokana  Monitors blood glucose: Every 2 or 3 days  Glucometer: Accucheck. Blood Glucose readings by download:  AVERAGE 158 with the range 86-238    Physical activity: exercise: He is walking outside, avg 1.5 miles daily with his dog  Dietician visit: Most recent: 1997 at diagnosis.  .   Wt Readings from Last 3 Encounters:  11/06/16 165 lb 12.8 oz (75.2 kg)  07/04/16 162 lb 9.6 oz (73.8 kg)  04/06/16 167 lb (75.8 kg)    Lab Results  Component Value Date   HGBA1C 6.7 (H) 10/30/2016   HGBA1C 6.7 (H) 06/29/2016   HGBA1C 6.7 (H) 04/03/2016   Lab Results  Component Value Date   MICROALBUR <0.7 04/03/2016   LDLCALC 103 (H) 10/30/2016   CREATININE 1.08 10/30/2016    OTHER active problems: See review of systems   No visits with results within 1 Week(s) from this visit.  Latest known visit with results is:  Lab on 10/30/2016  Component Date Value Ref Range Status  . Hgb A1c MFr Bld 10/30/2016 6.7* 4.6 - 6.5 % Final   Glycemic Control Guidelines for People with Diabetes:Non Diabetic:  <6%Goal of Therapy: <7%Additional Action Suggested:  >  8%   . Sodium 10/30/2016 138  135 - 145 mEq/L Final  . Potassium 10/30/2016 4.0  3.5 - 5.1 mEq/L Final  . Chloride 10/30/2016 105  96 - 112 mEq/L Final  . CO2 10/30/2016 25  19 - 32 mEq/L Final  . Glucose, Bld 10/30/2016 269* 70 - 99 mg/dL Final  . BUN 16/11/9602 30* 6 - 23 mg/dL Final  . Creatinine, Ser 10/30/2016 1.08  0.40 - 1.50 mg/dL Final  . Calcium 54/10/8117 9.2  8.4 - 10.5 mg/dL Final  . GFR 14/78/2956 71.98  >60.00 mL/min Final  . Cholesterol 10/30/2016 165  0 - 200 mg/dL Final   ATP III Classification       Desirable:  < 200 mg/dL               Borderline High:  200 - 239 mg/dL          High:  > = 213 mg/dL  . Triglycerides  10/30/2016 90.0  0.0 - 149.0 mg/dL Final   Normal:  <086 mg/dLBorderline High:  150 - 199 mg/dL  . HDL 10/30/2016 44.10  >39.00 mg/dL Final  . VLDL 57/84/6962 18.0  0.0 - 40.0 mg/dL Final  . LDL Cholesterol 10/30/2016 103* 0 - 99 mg/dL Final  . Total CHOL/HDL Ratio 10/30/2016 4   Final                  Men          Women1/2 Average Risk     3.4          3.3Average Risk          5.0          4.42X Average Risk          9.6          7.13X Average Risk          15.0          11.0                      . NonHDL 10/30/2016 120.80   Final   NOTE:  Non-HDL goal should be 30 mg/dL higher than patient's LDL goal (i.e. LDL goal of < 70 mg/dL, would have non-HDL goal of < 100 mg/dL)    Allergies as of 9/52/8413   No Known Allergies     Medication List       Accurate as of 11/06/16  8:57 PM. Always use your most recent med list.          ACCU-CHEK AVIVA PLUS test strip Generic drug:  glucose blood 2 (two) times daily.   ACCU-CHEK SOFTCLIX LANCETS lancets   alfuzosin 10 MG 24 hr tablet Commonly known as:  UROXATRAL Take 10 mg by mouth daily.   Alogliptin-Pioglitazone 25-30 MG Tabs Take 25 mg by mouth daily.   ALPRAZolam 0.5 MG tablet Commonly known as:  XANAX   atorvastatin 80 MG tablet Commonly known as:  LIPITOR TAKE 1 TABLET DAILY   buPROPion 300 MG 24 hr tablet Commonly known as:  WELLBUTRIN XL Take 300 mg by mouth daily.   finasteride 5 MG tablet Commonly known as:  PROSCAR   glyBURIDE 5 MG tablet Commonly known as:  DIABETA 5 mg. Takes 2 daily   hydrocortisone-pramoxine 2.5-1 % rectal cream Commonly known as:  ANALPRAM-HC   INVOKANA 300 MG Tabs tablet Generic drug:  canagliflozin TAKE 1 TABLET BY MOUTH EVERY DAY   mometasone 50  MCG/ACT nasal spray Commonly known as:  NASONEX USE 2 SPRAYS IN EACH NOSTRIL NASALLY ONCE A DAY AS NEEDED   nystatin cream Commonly known as:  MYCOSTATIN APPLY 1 APPLICATION TOPICALLY 2 (TWO) TIMES DAILY.   omeprazole 40 MG  capsule Commonly known as:  PRILOSEC as needed.   sildenafil 25 MG tablet Commonly known as:  VIAGRA Take 25 mg by mouth daily as needed for erectile dysfunction.            Discharge Care Instructions        Start     Ordered   11/06/16 0000  Flu Vaccine QUAD 36+ mos IM     11/06/16 1048      Allergies: No Known Allergies  No past medical history on file.  Past Surgical History:  Procedure Laterality Date  . MINOR HEMORRHOIDECTOMY N/A     Family History  Problem Relation Age of Onset  . Cancer Neg Hx     Social History:  reports that he has quit smoking. His smoking use included Cigarettes. He has never used smokeless tobacco. His alcohol and drug histories are not on file.  Review of Systems:   Eye Exam last: 3/18 With no retinopathy noted  Has had hypercholesterolemia Has been taking 80 mg Lipitor  His LDL Is relatively higher although previously below 100, he thinks this may be from not being consistent with diet recently   Lab Results  Component Value Date   CHOL 165 10/30/2016   HDL 44.10 10/30/2016   LDLCALC 103 (H) 10/30/2016   LDLDIRECT 134.6 11/18/2013   TRIG 90.0 10/30/2016   CHOLHDL 4 10/30/2016     Diabetic foot exam in 11/17 showed normal monofilament sensation in the toes and plantar surfaces, no skin lesions or ulcers on the feet and normal pedal pulses      Examination:   BP 124/70   Pulse 94   Ht 5\' 8"  (1.727 m)   Wt 165 lb 12.8 oz (75.2 kg)   SpO2 97%   BMI 25.21 kg/m   Body mass index is 25.21 kg/m.     Assesment/Plan:   Diabetes type 2 with BMI 25 See history of present illness for detailed discussion of current diabetes management, blood sugar patterns and problems identified  His A1c is reasonably good at 6.7 and stable with his multiple drug regimen He is walking fairly regularly although may not as much as before His high sugars are usually related to going off his diet with more carbohydrate or high-fat  intake No hypoglycemia with glyburide Discussed blood sugar targets at various times More monitoring after meals to help him improve his dietary compliance  LIPIDS: LDL is just over 100 and will repeat on next visit since his diet can be better   Follow-up in 4 months  Influenza vaccine given  Patient Instructions  Check blood sugars on waking up  2/7  Also check blood sugars about 2 hours after a meal and do this after different meals by rotation  Recommended blood sugar levels on waking up is 90-130 and about 2 hours after meal is 130-160  Please bring your blood sugar monitor to each visit, thank you     [facility] Center[facility]UM[facility],Mahogony Gilchrest 11/06/2016, 8:57 PM

## 2016-11-06 NOTE — Patient Instructions (Signed)
Check blood sugars on waking up  2/7  Also check blood sugars about 2 hours after a meal and do this after different meals by rotation  Recommended blood sugar levels on waking up is 90-130 and about 2 hours after meal is 130-160  Please bring your blood sugar monitor to each visit, thank you  

## 2016-11-22 ENCOUNTER — Other Ambulatory Visit: Payer: Self-pay | Admitting: Endocrinology

## 2016-11-22 DIAGNOSIS — E1165 Type 2 diabetes mellitus with hyperglycemia: Secondary | ICD-10-CM

## 2016-11-23 ENCOUNTER — Encounter: Payer: Federal, State, Local not specified - PPO | Admitting: Dietician

## 2016-12-14 ENCOUNTER — Ambulatory Visit (INDEPENDENT_AMBULATORY_CARE_PROVIDER_SITE_OTHER): Payer: Federal, State, Local not specified - PPO

## 2016-12-14 ENCOUNTER — Ambulatory Visit (INDEPENDENT_AMBULATORY_CARE_PROVIDER_SITE_OTHER): Payer: Federal, State, Local not specified - PPO | Admitting: Orthopaedic Surgery

## 2016-12-14 DIAGNOSIS — M25551 Pain in right hip: Secondary | ICD-10-CM

## 2016-12-14 DIAGNOSIS — M5441 Lumbago with sciatica, right side: Secondary | ICD-10-CM

## 2016-12-14 MED ORDER — DICLOFENAC SODIUM 75 MG PO TBEC: 75.0000 mg | DELAYED_RELEASE_TABLET | Freq: Two times a day (BID) | ORAL | 2 refills | Status: DC

## 2016-12-14 MED ORDER — PREDNISONE 10 MG (21) PO TBPK: ORAL_TABLET | ORAL | 0 refills | Status: DC

## 2016-12-14 NOTE — Progress Notes (Signed)
Office Visit Note   Patient: Kenneth Lawson           Date of Birth: 1945-11-16           MRN: [id number] Visit Date: 12/14/2016              Requested by: Clovis Riley, L.August Saucer, MD [address] [place], [place] [address] PCP: Clovis Riley, L.August Saucer, MD   Assessment & Plan: Visit Diagnoses:  1. Pain of right hip joint   2. Acute right-sided low back pain with right-sided sciatica     Plan: Overall impression is lumbar radiculopathy.  Patient has grade 2 spondylolisthesis appears to be degenerative.  But I recommend a prednisone taper and diclofenac with home exercises.  If not better patient should call us back and we would recommend an MRI of his lumbar spine.  Follow-Up Instructions: Return if symptoms worsen or fail to improve.   Orders:  Orders Placed This Encounter  Procedures  . XR HIP UNILAT W OR W/O PELVIS 2-3 VIEWS RIGHT  . XR Lumbar Spine 2-3 Views   Meds ordered this encounter  Medications  . diclofenac (VOLTAREN) 75 MG EC tablet    Sig: Take 1 tablet (75 mg total) by mouth 2 (two) times daily.    Dispense:  30 tablet    Refill:  2  . predniSONE (STERAPRED UNI-PAK 21 TAB) 10 MG (21) TBPK tablet    Sig: Take as directed    Dispense:  21 tablet    Refill:  0      Procedures: No procedures performed   Clinical Data: No additional findings.   Subjective: No chief complaint on file.   Patient is a very pleasant 71 year old gentleman who comes in with right hip pain that mainly is localized to the posterior aspect that radiates down into the calf.  He denies any numbness and tingling.  He is status post microdiscectomy about 15 years ago.  He denies any recent injuries.  He denies any groin pain    Review of Systems  Constitutional: Negative.   All other systems reviewed and are negative.    Objective: Vital Signs: There were no vitals taken for this visit.  Physical Exam  Constitutional: He is oriented to person, place, and time. He  appears well-developed and well-nourished.  HENT:  Head: Normocephalic and atraumatic.  Eyes: Pupils are equal, round, and reactive to light.  Neck: Neck supple.  Pulmonary/Chest: Effort normal.  Abdominal: Soft.  Musculoskeletal: Normal range of motion.  Neurological: He is alert and oriented to person, place, and time.  Skin: Skin is warm.  Psychiatric: He has a normal mood and affect. His behavior is normal. Judgment and thought content normal.  Nursing note and vitals reviewed.   Ortho Exam Right hip exam shows pretty good rotation without really any pain.  Mildly positive sciatic tension sign.  Negative Faber test.  Lateral hip is nontender. Specialty Comments:  No specialty comments available.  Imaging: Xr Hip Unilat W Or W/o Pelvis 2-3 Views Right  Result Date: 12/14/2016 Age appropriate osteoarthritis of bilateral hips with cam deformities no significant joint space narrowing  Xr Lumbar Spine 2-3 Views  Result Date: 12/14/2016 Lumbar spondylosis with grade 2 degenerative spondylolisthesis of L4-L5    PMFS History: Patient Active Problem List   Diagnosis Date Noted  . Depression 03/21/2013  . Type II or unspecified type diabetes mellitus without mention of complication, uncontrolled 09/15/2012  . Pure hypercholesterolemia 09/15/2012  . BPH (benign prostatic  hypertrophy) 09/15/2012   No past medical history on file.  Family History  Problem Relation Age of Onset  . Cancer Neg Hx     Past Surgical History:  Procedure Laterality Date  . MINOR HEMORRHOIDECTOMY N/A    Social History   Occupational History  . Not on file.   Social History Main Topics  . Smoking status: Former Smoker    Types: Cigarettes  . Smokeless tobacco: Never Used  . Alcohol use Not on file  . Drug use: Unknown  . Sexual activity: Not on file

## 2017-02-01 ENCOUNTER — Other Ambulatory Visit (INDEPENDENT_AMBULATORY_CARE_PROVIDER_SITE_OTHER): Payer: Federal, State, Local not specified - PPO

## 2017-02-01 DIAGNOSIS — E1165 Type 2 diabetes mellitus with hyperglycemia: Secondary | ICD-10-CM

## 2017-02-01 LAB — COMPREHENSIVE METABOLIC PANEL
ALT: 20 U/L (ref 0–53)
AST: 16 U/L (ref 0–37)
Albumin: 4.2 g/dL (ref 3.5–5.2)
Alkaline Phosphatase: 65 U/L (ref 39–117)
BUN: 18 mg/dL (ref 6–23)
CO2: 28 mEq/L (ref 19–32)
Calcium: 9 mg/dL (ref 8.4–10.5)
Chloride: 107 mEq/L (ref 96–112)
Creatinine, Ser: 0.96 mg/dL (ref 0.40–1.50)
GFR: 82.39 mL/min (ref >60.00)
Glucose, Bld: 168 mg/dL — ABNORMAL HIGH (ref 70–99)
Potassium: 4.1 mEq/L (ref 3.5–5.1)
Sodium: 140 mEq/L (ref 135–145)
Total Bilirubin: 0.5 mg/dL (ref 0.2–1.2)
Total Protein: 6.4 g/dL (ref 6.0–8.3)

## 2017-02-01 LAB — LIPID PANEL
Cholesterol: 169 mg/dL (ref 0–200)
HDL: 52 mg/dL (ref >39.00)
LDL Cholesterol: 98 mg/dL (ref 0–99)
NonHDL: 116.75
Total CHOL/HDL Ratio: 3
Triglycerides: 92 mg/dL (ref 0.0–149.0)
VLDL: 18.4 mg/dL (ref 0.0–40.0)

## 2017-02-01 LAB — MICROALBUMIN / CREATININE URINE RATIO
Creatinine,U: 60.1 mg/dL
Microalb Creat Ratio: 1.2 mg/g (ref 0.0–30.0)
Microalb, Ur: <0.7 mg/dL (ref 0.0–1.9)

## 2017-02-01 LAB — HEMOGLOBIN A1C: Hgb A1c MFr Bld: 6.9 % — ABNORMAL HIGH (ref 4.6–6.5)

## 2017-02-05 ENCOUNTER — Ambulatory Visit: Payer: Federal, State, Local not specified - PPO | Admitting: Endocrinology

## 2017-02-08 ENCOUNTER — Ambulatory Visit: Payer: Federal, State, Local not specified - PPO | Admitting: Endocrinology

## 2017-02-08 ENCOUNTER — Encounter: Payer: Self-pay | Admitting: Endocrinology

## 2017-02-08 VITALS — BP 116/74 | HR 78 | Ht 68.0 in | Wt 167.6 lb

## 2017-02-08 DIAGNOSIS — E1165 Type 2 diabetes mellitus with hyperglycemia: Secondary | ICD-10-CM | POA: Diagnosis not present

## 2017-02-08 NOTE — Progress Notes (Signed)
Patient ID: Kenneth LoyalFrederick Vierling, male   DOB: May 15, 1945, 71 y.o.   MRN: [id number]   Reason for Appointment: Diabetes follow-up    History of Present Illness    Diagnosis: date of diagnosis: 1997.   Previous history: He was previously treated with glyburide, Actos and metformin.  Metformin was stopped because he had GI side effects  His blood sugars had been relatively high in the last 3-4 years and A1c had been consistently over 7.5% until his initial consultation He was started on Invokana in 10/13 which had helped modestly. His blood sugars had shown considerable improvement with  adding  Nesina since 2/14 .    He was also on Actos which he had stopped but blood sugars did not appear to worsen. .  The previous HbgA1c levels: 6.4 in 4/14,  7.4 in 12/13, prior range from 7.8-8.3 since 2011   RECENT history:   Oral hypoglycemic drugs: Oseni 25/30, Invokana 300mg , glyburide 10mg  q pm  His blood sugars are overall usually well controlled with A1c under 7 although slightly higher at 6.9, has been as low as 6.34  Current blood sugar patterns, management and problems identified:  He has not brought his monitor for download  He thinks his blood sugars maybe higher with not controlling his carbohydrate intake at meals and occasionally snacks  Also for various reasons has not exercised as consistently as before  Some of his foods maybe higher in fat also with products like cheese  His weight has been gradually going up this year  He is probably checking his blood sugars mostly in the mornings and these are mildly increased but as low as about 120; nonfasting glucose in the lab was 165  No side effects from Invokana  Still taking 10 mg of glyburide in the evening but he does not report any hypoglycemic symptoms     Side effects from medications: bloating, gas and GI distress from metformin, even 500 mg, occasional balanitis from Invokana  Monitors blood glucose: Every 2 or  3 days  Glucometer: Accucheck. Blood Glucose readings by 120-150:    Physical activity: exercise: He is walking outside, slowly but about 2 miles daily with his dog  Dietician visit: Most recent: 1997 at diagnosis.  .   Wt Readings from Last 3 Encounters:  02/08/17 167 lb 9.6 oz (76 kg)  11/06/16 165 lb 12.8 oz (75.2 kg)  07/04/16 162 lb 9.6 oz (73.8 kg)    Lab Results  Component Value Date   HGBA1C 6.9 (H) 02/01/2017   HGBA1C 6.7 (H) 10/30/2016   HGBA1C 6.7 (H) 06/29/2016   Lab Results  Component Value Date   MICROALBUR <0.7 02/01/2017   LDLCALC 98 02/01/2017   CREATININE 0.96 02/01/2017    OTHER active problems: See review of systems   No visits with results within 1 Week(s) from this visit.  Latest known visit with results is:  Lab on 02/01/2017  Component Date Value Ref Range Status  . Cholesterol 02/01/2017 169  0 - 200 mg/dL Final   ATP III Classification       Desirable:  < 200 mg/dL               Borderline High:  200 - 239 mg/dL          High:  > = 782240 mg/dL  . Triglycerides 02/01/2017 92.0  0.0 - 149.0 mg/dL Final   Normal:  <956<150 mg/dLBorderline High:  150 - 199 mg/dL  . HDL 02/01/2017  52.00  >39.00 mg/dL Final  . VLDL 40/98/1191 18.4  0.0 - 40.0 mg/dL Final  . LDL Cholesterol 02/01/2017 98  0 - 99 mg/dL Final  . Total CHOL/HDL Ratio 02/01/2017 3   Final                  Men          Women1/2 Average Risk     3.4          3.3Average Risk          5.0          4.42X Average Risk          9.6          7.13X Average Risk          15.0          11.0                      . NonHDL 02/01/2017 116.75   Final   NOTE:  Non-HDL goal should be 30 mg/dL higher than patient's LDL goal (i.e. LDL goal of < 70 mg/dL, would have non-HDL goal of < 100 mg/dL)  . Microalb, Ur 02/01/2017 <0.7  0.0 - 1.9 mg/dL Final  . Creatinine,U 47/82/9562 60.1  mg/dL Final  . Microalb Creat Ratio 02/01/2017 1.2  0.0 - 30.0 mg/g Final  . Sodium 02/01/2017 140  135 - 145 mEq/L Final  .  Potassium 02/01/2017 4.1  3.5 - 5.1 mEq/L Final  . Chloride 02/01/2017 107  96 - 112 mEq/L Final  . CO2 02/01/2017 28  19 - 32 mEq/L Final  . Glucose, Bld 02/01/2017 168* 70 - 99 mg/dL Final  . BUN 13/09/6576 18  6 - 23 mg/dL Final  . Creatinine, Ser 02/01/2017 0.96  0.40 - 1.50 mg/dL Final  . Total Bilirubin 02/01/2017 0.5  0.2 - 1.2 mg/dL Final  . Alkaline Phosphatase 02/01/2017 65  39 - 117 U/L Final  . AST 02/01/2017 16  0 - 37 U/L Final  . ALT 02/01/2017 20  0 - 53 U/L Final  . Total Protein 02/01/2017 6.4  6.0 - 8.3 g/dL Final  . Albumin 46/96/2952 4.2  3.5 - 5.2 g/dL Final  . Calcium 84/13/2440 9.0  8.4 - 10.5 mg/dL Final  . GFR 12/23/2534 82.39  >60.00 mL/min Final  . Hgb A1c MFr Bld 02/01/2017 6.9* 4.6 - 6.5 % Final   Glycemic Control Guidelines for People with Diabetes:Non Diabetic:  <6%Goal of Therapy: <7%Additional Action Suggested:  >8%     Allergies as of 02/08/2017   No Known Allergies     Medication List        Accurate as of 02/08/17 11:59 PM. Always use your most recent med list.          ACCU-CHEK AVIVA PLUS test strip Generic drug:  glucose blood 2 (two) times daily.   ACCU-CHEK SOFTCLIX LANCETS lancets   alfuzosin 10 MG 24 hr tablet Commonly known as:  UROXATRAL Take 10 mg by mouth daily.   Alogliptin-Pioglitazone 25-30 MG Tabs Take 25 mg by mouth daily.   ALPRAZolam 0.5 MG tablet Commonly known as:  XANAX   atorvastatin 80 MG tablet Commonly known as:  LIPITOR TAKE 1 TABLET DAILY   buPROPion 300 MG 24 hr tablet Commonly known as:  WELLBUTRIN XL Take 300 mg by mouth daily.   diclofenac 75 MG EC tablet Commonly known as:  VOLTAREN Take 1 tablet (75  mg total) by mouth 2 (two) times daily.   finasteride 5 MG tablet Commonly known as:  PROSCAR   glyBURIDE 5 MG tablet Commonly known as:  DIABETA 5 mg. Takes 2 daily   hydrocortisone-pramoxine 2.5-1 % rectal cream Commonly known as:  ANALPRAM-HC   INVOKANA 300 MG Tabs tablet Generic  drug:  canagliflozin TAKE 1 TABLET BY MOUTH EVERY DAY   mometasone 50 MCG/ACT nasal spray Commonly known as:  NASONEX USE 2 SPRAYS IN EACH NOSTRIL NASALLY ONCE A DAY AS NEEDED   nystatin cream Commonly known as:  MYCOSTATIN APPLY 1 APPLICATION TOPICALLY 2 (TWO) TIMES DAILY.   omeprazole 40 MG capsule Commonly known as:  PRILOSEC as needed.   predniSONE 10 MG (21) Tbpk tablet Commonly known as:  STERAPRED UNI-PAK 21 TAB Take as directed   sildenafil 25 MG tablet Commonly known as:  VIAGRA Take 25 mg by mouth daily as needed for erectile dysfunction.       Allergies: No Known Allergies  Past Medical History:  Diagnosis Date  . Hypercholesterolemia     Past Surgical History:  Procedure Laterality Date  . MINOR HEMORRHOIDECTOMY N/A     Family History  Problem Relation Age of Onset  . Cancer Neg Hx     Social History:  reports that he has quit smoking. His smoking use included cigarettes. he has never used smokeless tobacco. His alcohol and drug histories are not on file.  Review of Systems:   Eye Exam last: 3/18 With no retinopathy noted  Has had hypercholesterolemia Has been taking 80 mg Lipitor  His LDL Is relatively better and now below 100 He does have she is regularly which is not low-fat   Lab Results  Component Value Date   CHOL 169 02/01/2017   HDL 52.00 02/01/2017   LDLCALC 98 02/01/2017   LDLDIRECT 134.6 11/18/2013   TRIG 92.0 02/01/2017   CHOLHDL 3 02/01/2017     Diabetic foot exam in 11/17 showed normal monofilament sensation in the toes and plantar surfaces, no skin lesions or ulcers on the feet and normal pedal pulses      Examination:   BP 116/74   Pulse 78   Ht 5\' 8"  (1.727 m)   Wt 167 lb 9.6 oz (76 kg)   SpO2 97%   BMI 25.48 kg/m   Body mass index is 25.48 kg/m.     Assesment/Plan:   Diabetes type 2 with BMI 25 See history of present illness for detailed discussion of current diabetes management, blood sugar patterns  and problems identified  His A1c is relatively higher at 6.9 Also he is gaining weight this year  As discussed above he has room for improvement in his diet with carbohydrate control and more programmed exercise. However overall control is reasonably good with no significantly high readings documented  Does need to start monitoring after meals to help him improve his dietary compliance, this is again emphasized as before He has the ability to go to the gym and advised him to start doing some  LIPIDS: LDL is improving However he can cut back saturated fat and use low-fat products like cheese   Follow-up in 3 months    Patient Instructions  Check blood sugars on waking up  2-3/7  Also check blood sugars about 2 hours after a meal and do this after different meals by rotation  Recommended blood sugar levels on waking up is 90-130 and about 2 hours after meal is 130-160  Please bring  your blood sugar monitor to each visit, thank you  Exercise 5/7 days  Lower fat dairy like cheese    Reather Littler 02/09/2017, 12:13 PM

## 2017-02-08 NOTE — Patient Instructions (Addendum)
Check blood sugars on waking up  2-3/7  Also check blood sugars about 2 hours after a meal and do this after different meals by rotation  Recommended blood sugar levels on waking up is 90-130 and about 2 hours after meal is 130-160  Please bring your blood sugar monitor to each visit, thank you  Exercise 5/7 days  Lower fat dairy like cheese

## 2017-02-09 ENCOUNTER — Encounter: Payer: Self-pay | Admitting: Endocrinology

## 2017-03-18 ENCOUNTER — Other Ambulatory Visit: Payer: Self-pay

## 2017-03-18 ENCOUNTER — Telehealth: Payer: Self-pay | Admitting: Endocrinology

## 2017-03-18 MED ORDER — CANAGLIFLOZIN 300 MG PO TABS: 300.0000 mg | ORAL_TABLET | Freq: Every day | ORAL | 5 refills | Status: DC

## 2017-03-18 NOTE — Telephone Encounter (Signed)
Need refill of INVOKANA 300 MG TABS tablet [[id number] Send to  [facility] [place] [id number] AT Portal to [facility] [phone] (Phone) [phone] (Fax)

## 2017-03-18 NOTE — Telephone Encounter (Signed)
This has been sent

## 2017-03-21 ENCOUNTER — Encounter: Payer: Self-pay | Admitting: Endocrinology

## 2017-03-22 NOTE — Telephone Encounter (Signed)
Pt stated that he sent a mychart message. He is needing to get a dr approval for medication .  Pt stated he had a few left, he cut them in half to get him through al ittle longer till he can get the approval done to get his medication   Please advise  [phone]

## 2017-03-24 ENCOUNTER — Other Ambulatory Visit: Payer: Self-pay | Admitting: Endocrinology

## 2017-03-24 MED ORDER — EMPAGLIFLOZIN 25 MG PO TABS: 25.0000 mg | ORAL_TABLET | Freq: Every day | ORAL | 2 refills | Status: DC

## 2017-03-26 ENCOUNTER — Encounter: Payer: Self-pay | Admitting: Endocrinology

## 2017-04-03 ENCOUNTER — Telehealth: Payer: Self-pay

## 2017-04-03 NOTE — Telephone Encounter (Signed)
Patient has been approved for Jardiance through 03/26/18 patient has been notified of this

## 2017-04-10 ENCOUNTER — Telehealth: Payer: Self-pay | Admitting: Endocrinology

## 2017-04-11 ENCOUNTER — Other Ambulatory Visit: Payer: Self-pay | Admitting: Endocrinology

## 2017-04-11 NOTE — Telephone Encounter (Signed)
Please advise if okay to fill  

## 2017-04-11 NOTE — Telephone Encounter (Signed)
Yes

## 2017-04-11 NOTE — Telephone Encounter (Signed)
This medication has been filled.

## 2017-05-06 ENCOUNTER — Other Ambulatory Visit (INDEPENDENT_AMBULATORY_CARE_PROVIDER_SITE_OTHER): Payer: Federal, State, Local not specified - PPO

## 2017-05-06 DIAGNOSIS — E1165 Type 2 diabetes mellitus with hyperglycemia: Secondary | ICD-10-CM | POA: Diagnosis not present

## 2017-05-06 LAB — BASIC METABOLIC PANEL
BUN: 20 mg/dL (ref 6–23)
CO2: 28 mEq/L (ref 19–32)
Calcium: 9.3 mg/dL (ref 8.4–10.5)
Chloride: 109 mEq/L (ref 96–112)
Creatinine, Ser: 0.97 mg/dL (ref 0.40–1.50)
GFR: 81.35 mL/min (ref >60.00)
Glucose, Bld: 179 mg/dL — ABNORMAL HIGH (ref 70–99)
Potassium: 4.1 mEq/L (ref 3.5–5.1)
Sodium: 141 mEq/L (ref 135–145)

## 2017-05-06 LAB — HEMOGLOBIN A1C: Hgb A1c MFr Bld: 6.7 % — ABNORMAL HIGH (ref 4.6–6.5)

## 2017-05-09 ENCOUNTER — Encounter: Payer: Self-pay | Admitting: Endocrinology

## 2017-05-09 ENCOUNTER — Ambulatory Visit: Payer: Federal, State, Local not specified - PPO | Admitting: Endocrinology

## 2017-05-09 VITALS — BP 120/74 | HR 84 | Wt 162.0 lb

## 2017-05-09 DIAGNOSIS — E1165 Type 2 diabetes mellitus with hyperglycemia: Secondary | ICD-10-CM | POA: Diagnosis not present

## 2017-05-09 NOTE — Patient Instructions (Signed)
More sugars after all meals

## 2017-05-09 NOTE — Progress Notes (Signed)
Patient ID: Kenneth Lawson, male   DOB: Apr 07, 1945, 72 y.o.   MRN: [id number]   Reason for Appointment: Diabetes follow-up    History of Present Illness    Diagnosis: date of diagnosis: 1997.   Previous history: He was previously treated with glyburide, Actos and metformin.  Metformin was stopped because he had GI side effects  His blood sugars had been relatively high in the last 3-4 years and A1c had been consistently over 7.5% until his initial consultation He was started on Invokana in 10/13 which had helped modestly. His blood sugars had shown considerable improvement with  adding  Nesina since 2/14 .    He was also on Actos which he had stopped but blood sugars did not appear to worsen. .  The previous HbgA1c levels: 6.4 in 4/14,  7.4 in 12/13, prior range from 7.8-8.3 since 2011   RECENT history:   Oral hypoglycemic drugs: Oseni 25/30, Invokana 300mg , glyburide 10mg  q pm  His blood sugars are overall usually well controlled with A1c under 7   A1c is 6.7 now  Current blood sugar patterns, management and problems identified:  He has again not brought his monitor for download  Sugars are reportedly fairly good at home but checking mostly in the morning  His lab glucose was 179 after breakfast when he has 2 slices of toast, juice along with his cheese  Kenneth Lawson does not check readings after meals and especially after breakfast  He thinks he is walking fairly regularly  His compliance with carbohydrate intake with meals and snacks is again variable   no hypoglycemic symptoms with glyburide    Side effects from medications: bloating, gas and GI distress from metformin, even 500 mg, occasional balanitis from Invokana  Monitors blood glucose: Every 2 or 3 days   Glucometer: Accucheck. Blood Glucose readings by recall, usually 95-120 range in the morning, after meals rarely checked   Physical activity: exercise: He is walking outside, about 1.5-2 miles daily  with his dog,   Dietician visit: Most recent: 1997 at diagnosis.  .   Wt Readings from Last 3 Encounters:  05/09/17 162 lb (73.5 kg)  02/08/17 167 lb 9.6 oz (76 kg)  11/06/16 165 lb 12.8 oz (75.2 kg)    Lab Results  Component Value Date   HGBA1C 6.7 (H) 05/06/2017   HGBA1C 6.9 (H) 02/01/2017   HGBA1C 6.7 (H) 10/30/2016   Lab Results  Component Value Date   MICROALBUR <0.7 02/01/2017   LDLCALC 98 02/01/2017   CREATININE 0.97 05/06/2017    OTHER active problems: See review of systems   Lab on 05/06/2017  Component Date Value Ref Range Status  . Sodium 05/06/2017 141  135 - 145 mEq/L Final  . Potassium 05/06/2017 4.1  3.5 - 5.1 mEq/L Final  . Chloride 05/06/2017 109  96 - 112 mEq/L Final  . CO2 05/06/2017 28  19 - 32 mEq/L Final  . Glucose, Bld 05/06/2017 179* 70 - 99 mg/dL Final  . BUN 40/98/1191 20  6 - 23 mg/dL Final  . Creatinine, Ser 05/06/2017 0.97  0.40 - 1.50 mg/dL Final  . Calcium 47/82/9562 9.3  8.4 - 10.5 mg/dL Final  . GFR 13/09/6576 81.35  >60.00 mL/min Final  . Hgb A1c MFr Bld 05/06/2017 6.7* 4.6 - 6.5 % Final   Glycemic Control Guidelines for People with Diabetes:Non Diabetic:  <6%Goal of Therapy: <7%Additional Action Suggested:  >8%     Allergies as of 05/09/2017   No  Known Allergies     Medication List        Accurate as of 05/09/17  1:47 PM. Always use your most recent med list.          ACCU-CHEK AVIVA PLUS test strip Generic drug:  glucose blood 2 (two) times daily.   ACCU-CHEK SOFTCLIX LANCETS lancets   alfuzosin 10 MG 24 hr tablet Commonly known as:  UROXATRAL Take 10 mg by mouth daily.   Alogliptin-Pioglitazone 25-30 MG Tabs TAKE 1 TABLET DAILY   ALPRAZolam 0.5 MG tablet Commonly known as:  XANAX   atorvastatin 80 MG tablet Commonly known as:  LIPITOR TAKE 1 TABLET DAILY   atorvastatin 80 MG tablet Commonly known as:  LIPITOR TAKE 1 TABLET DAILY   buPROPion 300 MG 24 hr tablet Commonly known as:  WELLBUTRIN XL Take 300  mg by mouth daily.   diclofenac 75 MG EC tablet Commonly known as:  VOLTAREN Take 1 tablet (75 mg total) by mouth 2 (two) times daily.   empagliflozin 25 MG Tabs tablet Commonly known as:  JARDIANCE Take 25 mg by mouth daily.   finasteride 5 MG tablet Commonly known as:  PROSCAR   glyBURIDE 5 MG tablet Commonly known as:  DIABETA 5 mg. Takes 2 daily   hydrocortisone-pramoxine 2.5-1 % rectal cream Commonly known as:  ANALPRAM-HC   mometasone 50 MCG/ACT nasal spray Commonly known as:  NASONEX USE 2 SPRAYS IN EACH NOSTRIL NASALLY ONCE A DAY AS NEEDED   nystatin cream Commonly known as:  MYCOSTATIN APPLY TOPICALLY 2 (TWO) TIMES DAILY.   omeprazole 40 MG capsule Commonly known as:  PRILOSEC as needed.   predniSONE 10 MG (21) Tbpk tablet Commonly known as:  STERAPRED UNI-PAK 21 TAB Take as directed   sildenafil 25 MG tablet Commonly known as:  VIAGRA Take 25 mg by mouth daily as needed for erectile dysfunction.       Allergies: No Known Allergies  Past Medical History:  Diagnosis Date  . Hypercholesterolemia     Past Surgical History:  Procedure Laterality Date  . MINOR HEMORRHOIDECTOMY N/A     Family History  Problem Relation Age of Onset  . Cancer Neg Hx     Social History:  reports that he has quit smoking. His smoking use included cigarettes. he has never used smokeless tobacco. His alcohol and drug histories are not on file.  Review of Systems:   Eye Exam last: 3/18 With no retinopathy noted  Has had hypercholesterolemia Has been taking 80 mg Lipitor and LDL is below 100 He has been asked to cut back on saturated fat and foods like cheese  Lab Results  Component Value Date   CHOL 169 02/01/2017   HDL 52.00 02/01/2017   LDLCALC 98 02/01/2017   LDLDIRECT 134.6 11/18/2013   TRIG 92.0 02/01/2017   CHOLHDL 3 02/01/2017     Diabetic foot exam in 04/2017 showed normal monofilament sensation in the toes and plantar surfaces, no skin lesions or  ulcers on the feet and normal pedal pulses      Examination:   BP 120/74 (BP Location: Left Arm, Patient Position: Sitting, Cuff Size: Normal)   Pulse 84   Wt 162 lb (73.5 kg)   SpO2 97%   BMI 24.63 kg/m   Body mass index is 24.63 kg/m.    Diabetic Foot Exam - Simple   Simple Foot Form Diabetic Foot exam was performed with the following findings:  Yes 05/09/2017  1:53 PM  Visual Inspection  No deformities, no ulcerations, no other skin breakdown bilaterally:  Yes Sensation Testing Intact to touch and monofilament testing bilaterally:  Yes Pulse Check Posterior Tibialis and Dorsalis pulse intact bilaterally:  Yes Comments Hammer toe left     Assesment/Plan:   Diabetes type 2 with BMI 25 See history of present illness for detailed discussion of current diabetes management, blood sugar patterns and problems identified  His A1c is consistently below 7% and now 6.7  This is somewhat better than on his previous visit and he is able to maintain his weight better He also does walk fairly regularly He does not check readings after meals and discussed that he likely has mealtime spikes despite A1c being fairly good and explained that his glucose is going up at least 70-80 points after breakfast as seen on his lab work  He needs to cut back on his carbohydrate at breakfast and possibly other meals Should start checking readings after meals more consistently  Foot exam does not show evidence of neuropathy   Follow-up in 4 months    There are no Patient Instructions on file for this visit.   Reather Littler 05/09/2017, 1:47 PM

## 2017-06-20 ENCOUNTER — Other Ambulatory Visit: Payer: Self-pay | Admitting: Endocrinology

## 2017-07-15 LAB — HM DIABETES EYE EXAM

## 2017-07-25 ENCOUNTER — Other Ambulatory Visit: Payer: Self-pay

## 2017-07-25 ENCOUNTER — Telehealth: Payer: Self-pay | Admitting: Endocrinology

## 2017-07-25 MED ORDER — GLYBURIDE 5 MG PO TABS: ORAL_TABLET | ORAL | 3 refills | Status: DC

## 2017-07-25 NOTE — Telephone Encounter (Signed)
Patient was being prescribed Glyburide by his PCP-Dr. Clovis Riley. [facility]. Pt assumes they refused because they want Dr. Lucianne Muss to take care of that since he is a Endocrinology patient. [facility] [address]. If questions or problems please call pt at ph# [phone]

## 2017-07-25 NOTE — Telephone Encounter (Signed)
Medication sent to pharmacy per pt request.

## 2017-07-25 NOTE — Telephone Encounter (Signed)
Ok to refill 

## 2017-07-25 NOTE — Telephone Encounter (Signed)
Please refill at the same dose

## 2017-09-04 ENCOUNTER — Other Ambulatory Visit (INDEPENDENT_AMBULATORY_CARE_PROVIDER_SITE_OTHER): Payer: Federal, State, Local not specified - PPO

## 2017-09-04 DIAGNOSIS — E1165 Type 2 diabetes mellitus with hyperglycemia: Secondary | ICD-10-CM | POA: Diagnosis not present

## 2017-09-04 LAB — COMPREHENSIVE METABOLIC PANEL
ALT: 23 U/L (ref 0–53)
AST: 19 U/L (ref 0–37)
Albumin: 3.9 g/dL (ref 3.5–5.2)
Alkaline Phosphatase: 78 U/L (ref 39–117)
BUN: 17 mg/dL (ref 6–23)
CO2: 26 mEq/L (ref 19–32)
Calcium: 8.9 mg/dL (ref 8.4–10.5)
Chloride: 110 mEq/L (ref 96–112)
Creatinine, Ser: 0.91 mg/dL (ref 0.40–1.50)
GFR: 87.49 mL/min (ref >60.00)
Glucose, Bld: 188 mg/dL — ABNORMAL HIGH (ref 70–99)
Potassium: 3.8 mEq/L (ref 3.5–5.1)
Sodium: 142 mEq/L (ref 135–145)
Total Bilirubin: 0.6 mg/dL (ref 0.2–1.2)
Total Protein: 6.4 g/dL (ref 6.0–8.3)

## 2017-09-04 LAB — HEMOGLOBIN A1C: Hgb A1c MFr Bld: 6.7 % — ABNORMAL HIGH (ref 4.6–6.5)

## 2017-09-04 LAB — LIPID PANEL
Cholesterol: 151 mg/dL (ref 0–200)
HDL: 48.2 mg/dL (ref >39.00)
LDL Cholesterol: 83 mg/dL (ref 0–99)
NonHDL: 103.27
Total CHOL/HDL Ratio: 3
Triglycerides: 99 mg/dL (ref 0.0–149.0)
VLDL: 19.8 mg/dL (ref 0.0–40.0)

## 2017-09-09 ENCOUNTER — Ambulatory Visit: Payer: Federal, State, Local not specified - PPO | Admitting: Endocrinology

## 2017-09-09 ENCOUNTER — Encounter: Payer: Self-pay | Admitting: Endocrinology

## 2017-09-09 VITALS — BP 118/62 | HR 91 | Ht 68.0 in | Wt 157.6 lb

## 2017-09-09 DIAGNOSIS — E1165 Type 2 diabetes mellitus with hyperglycemia: Secondary | ICD-10-CM | POA: Diagnosis not present

## 2017-09-09 MED ORDER — GLIMEPIRIDE 4 MG PO TABS: 4.0000 mg | ORAL_TABLET | Freq: Two times a day (BID) | ORAL | 3 refills | Status: DC

## 2017-09-09 NOTE — Patient Instructions (Addendum)
Glimeperide before meals 2x daily when out of Glyburide  Check blood sugars on waking up  2-3/7 days  Also check blood sugars about 2 hours after a meal and do this after different meals by rotation  Recommended blood sugar levels on waking up is 90-130 and about 2 hours after meal is 130-160  Please bring your blood sugar monitor to each visit, thank you  Reduce Carbs

## 2017-09-09 NOTE — Progress Notes (Signed)
Patient ID: Kenneth Lawson, male   DOB: [birth year], 72 y.o.   MRN: [id number]   Reason for Appointment: Diabetes follow-up    History of Present Illness    Diagnosis: date of diagnosis: 1997.   Previous history: He was previously treated with glyburide, Actos and metformin.  Metformin was stopped because he had GI side effects  His blood sugars had been relatively high in the last 3-4 years and A1c had been consistently over 7.5% until his initial consultation He was started on Invokana in 10/13 which had helped modestly. His blood sugars had shown considerable improvement with  adding  Nesina since 2/14 .    He was also on Actos which he had stopped but blood sugars did not appear to worsen. .  The previous HbgA1c levels: 6.4 in 4/14,  7.4 in 12/13, prior range from 7.8-8.3 since 2011   RECENT history:   Oral hypoglycemic drugs: Oseni 25/30, Invokana 300mg , glyburide 10mg  q pm  His blood sugars are overall usually well controlled with A1c under 7   A1c is 6.7 again  Current blood sugar patterns, management and problems identified:  He has checked his blood sugars very infrequently and only on 6 days in the last month  He has a couple of readings over 200 which are related to large portion of carbohydrate  He has been losing weight, likely from other reasons including recent GI problems  Although he is continuing to take glyburide he has not reported any symptoms of weakness recently which he has previously had with low normal or low sugars  Usually after breakfast his blood sugar is high in the lab  He does try to take his walk daily and usually walking 2 miles    Side effects from medications: bloating, gas and GI distress from metformin, even 500 mg, occasional balanitis from Invokana  Monitors blood glucose: Every 2 or 3 days   Glucometer: Accucheck. Blood Glucose readings range from 73 up to 223 with only 6 readings High readings after breakfast or lunch  and lowest reading fasting   Physical activity: exercise: He is walking outside, about 2 miles daily with his dog,   Dietician visit: Most recent: 1997 at diagnosis.  .   Wt Readings from Last 3 Encounters:  09/09/17 157 lb 9.6 oz (71.5 kg)  05/09/17 162 lb (73.5 kg)  02/08/17 167 lb 9.6 oz (76 kg)    Lab Results  Component Value Date   HGBA1C 6.7 (H) 09/04/2017   HGBA1C 6.7 (H) 05/06/2017   HGBA1C 6.9 (H) 02/01/2017   Lab Results  Component Value Date   MICROALBUR <0.7 02/01/2017   LDLCALC 83 09/04/2017   CREATININE 0.91 09/04/2017    OTHER active problems: See review of systems   Lab on 09/04/2017  Component Date Value Ref Range Status  . Cholesterol 09/04/2017 151  0 - 200 mg/dL Final   ATP III Classification       Desirable:  < 200 mg/dL               Borderline High:  200 - 239 mg/dL          High:  > = 409 mg/dL  . Triglycerides 09/04/2017 99.0  0.0 - 149.0 mg/dL Final   Normal:  <811 mg/dLBorderline High:  150 - 199 mg/dL  . HDL 09/04/2017 48.20  >39.00 mg/dL Final  . VLDL 91/47/8295 19.8  0.0 - 40.0 mg/dL Final  . LDL Cholesterol 09/04/2017 83  0 -  99 mg/dL Final  . Total CHOL/HDL Ratio 09/04/2017 3   Final                  Men          Women1/2 Average Risk     3.4          3.3Average Risk          5.0          4.42X Average Risk          9.6          7.13X Average Risk          15.0          11.0                      . NonHDL 09/04/2017 103.27   Final   NOTE:  Non-HDL goal should be 30 mg/dL higher than patient's LDL goal (i.e. LDL goal of < 70 mg/dL, would have non-HDL goal of < 100 mg/dL)  . Sodium 09/04/2017 142  135 - 145 mEq/L Final  . Potassium 09/04/2017 3.8  3.5 - 5.1 mEq/L Final  . Chloride 09/04/2017 110  96 - 112 mEq/L Final  . CO2 09/04/2017 26  19 - 32 mEq/L Final  . Glucose, Bld 09/04/2017 188* 70 - 99 mg/dL Final  . BUN 65/78/4696 17  6 - 23 mg/dL Final  . Creatinine, Ser 09/04/2017 0.91  0.40 - 1.50 mg/dL Final  . Total Bilirubin 09/04/2017  0.6  0.2 - 1.2 mg/dL Final  . Alkaline Phosphatase 09/04/2017 78  39 - 117 U/L Final  . AST 09/04/2017 19  0 - 37 U/L Final  . ALT 09/04/2017 23  0 - 53 U/L Final  . Total Protein 09/04/2017 6.4  6.0 - 8.3 g/dL Final  . Albumin 29/52/8413 3.9  3.5 - 5.2 g/dL Final  . Calcium 24/40/1027 8.9  8.4 - 10.5 mg/dL Final  . GFR 25/36/6440 87.49  >60.00 mL/min Final  . Hgb A1c MFr Bld 09/04/2017 6.7* 4.6 - 6.5 % Final   Glycemic Control Guidelines for People with Diabetes:Non Diabetic:  <6%Goal of Therapy: <7%Additional Action Suggested:  >8%     Allergies as of 09/09/2017   No Known Allergies     Medication List        Accurate as of 09/09/17  1:47 PM. Always use your most recent med list.          ACCU-CHEK AVIVA PLUS test strip Generic drug:  glucose blood 2 (two) times daily.   ACCU-CHEK SOFTCLIX LANCETS lancets   alfuzosin 10 MG 24 hr tablet Commonly known as:  UROXATRAL Take 10 mg by mouth daily.   Alogliptin-Pioglitazone 25-30 MG Tabs TAKE 1 TABLET DAILY   ALPRAZolam 0.5 MG tablet Commonly known as:  XANAX   atorvastatin 80 MG tablet Commonly known as:  LIPITOR TAKE 1 TABLET DAILY   buPROPion 300 MG 24 hr tablet Commonly known as:  WELLBUTRIN XL Take 300 mg by mouth daily.   diclofenac 75 MG EC tablet Commonly known as:  VOLTAREN Take 1 tablet (75 mg total) by mouth 2 (two) times daily.   finasteride 5 MG tablet Commonly known as:  PROSCAR   glyBURIDE 5 MG tablet Commonly known as:  DIABETA Takes 2 (10mg ) daily.   hydrocortisone-pramoxine 2.5-1 % rectal cream Commonly known as:  ANALPRAM-HC   JARDIANCE 25 MG Tabs tablet Generic drug:  empagliflozin TAKE 25  MG BY MOUTH DAILY.   mometasone 50 MCG/ACT nasal spray Commonly known as:  NASONEX USE 2 SPRAYS IN EACH NOSTRIL NASALLY ONCE A DAY AS NEEDED   nystatin cream Commonly known as:  MYCOSTATIN APPLY TOPICALLY 2 (TWO) TIMES DAILY.   omeprazole 40 MG capsule Commonly known as:  PRILOSEC as needed.    sildenafil 25 MG tablet Commonly known as:  VIAGRA Take 25 mg by mouth daily as needed for erectile dysfunction.       Allergies: No Known Allergies  Past Medical History:  Diagnosis Date  . Hypercholesterolemia     Past Surgical History:  Procedure Laterality Date  . MINOR HEMORRHOIDECTOMY N/A     Family History  Problem Relation Age of Onset  . Cancer Neg Hx     Social History:  reports that he has quit smoking. His smoking use included cigarettes. He has never used smokeless tobacco. His alcohol and drug histories are not on file.  Review of Systems:   Eye Exam last: 3/18 With no retinopathy noted  Has had hypercholesterolemia Has been taking 80 mg Lipitor and LDL is below 100    Lab Results  Component Value Date   CHOL 151 09/04/2017   HDL 48.20 09/04/2017   LDLCALC 83 09/04/2017   LDLDIRECT 134.6 11/18/2013   TRIG 99.0 09/04/2017   CHOLHDL 3 09/04/2017     Diabetic foot exam in 04/2017 showed normal monofilament sensation in the toes and plantar surfaces, no skin lesions or ulcers on the feet and normal pedal pulses      Examination:   BP 118/62 (BP Location: Left Arm, Patient Position: Sitting, Cuff Size: Normal)   Pulse 91   Ht 5\' 8"  (1.727 m)   Wt 157 lb 9.6 oz (71.5 kg)   SpO2 97%   BMI 23.96 kg/m   Body mass index is 23.96 kg/m.     Assesment/Plan:   Diabetes type 2 with BMI 25 See history of present illness for detailed discussion of current diabetes management, blood sugar patterns and problems identified  His A1c is consistently below 7% and now 6.7  Discussed need for better control of carbohydrate at meals especially breakfast when he has readings over 200 at times [facility] Discussed that this will have potential for less hypoglycemia and may also do better with postprandial readings; likely may have lower cardiovascular risk of long-term also To start with he will use 4 mg twice daily of the glimepiride; if he  has any low sugars he will need to reduce the dose No change in other medications  More consistent monitoring after meals  He will be talking to his PCP and gastroenterologist about continued weight loss    Follow-up in 3 months    There are no Patient Instructions on file for this visit.   Reather LittlerAjay Cookie Pore 09/09/2017, 1:47 PM

## 2017-09-12 ENCOUNTER — Other Ambulatory Visit: Payer: Self-pay | Admitting: Endocrinology

## 2017-12-03 ENCOUNTER — Encounter (INDEPENDENT_AMBULATORY_CARE_PROVIDER_SITE_OTHER): Payer: Self-pay | Admitting: Orthopaedic Surgery

## 2017-12-03 ENCOUNTER — Ambulatory Visit (INDEPENDENT_AMBULATORY_CARE_PROVIDER_SITE_OTHER): Payer: Federal, State, Local not specified - PPO | Admitting: Orthopaedic Surgery

## 2017-12-03 ENCOUNTER — Ambulatory Visit (INDEPENDENT_AMBULATORY_CARE_PROVIDER_SITE_OTHER): Payer: Self-pay

## 2017-12-03 DIAGNOSIS — M25551 Pain in right hip: Secondary | ICD-10-CM | POA: Diagnosis not present

## 2017-12-03 DIAGNOSIS — G8929 Other chronic pain: Secondary | ICD-10-CM

## 2017-12-03 DIAGNOSIS — M545 Low back pain, unspecified: Secondary | ICD-10-CM

## 2017-12-03 MED ORDER — DICLOFENAC SODIUM 75 MG PO TBEC: 75.0000 mg | DELAYED_RELEASE_TABLET | Freq: Two times a day (BID) | ORAL | 2 refills | Status: DC

## 2017-12-03 MED ORDER — PREDNISONE 10 MG (21) PO TBPK: ORAL_TABLET | ORAL | 0 refills | Status: DC

## 2017-12-03 NOTE — Progress Notes (Signed)
Office Visit Note   Patient: Kenneth Lawson           Date of Birth: 1945-05-11           MRN: [id number] Visit Date: 12/03/2017              Requested by: Clovis Riley, L.August Saucer, MD [address] [place], [place] [address] PCP: Clovis Riley, L.August Saucer, MD   Assessment & Plan: Visit Diagnoses:  1. Pain in right hip   2. Chronic low back pain, unspecified back pain laterality, unspecified whether sciatica present     Plan: Right-sided low back pain with radiculopathy into the S1 distribution on the right.  X-rays today show stable grade 1 anterolisthesis of L4 on L5.  We will treat today with prednisone Dosepak.  Patient will follow-up by phone in 1 to 2 weeks if he is not having any improvement.  At that time will obtain MRI and consider referral for epidural steroid injection.  Follow-Up Instructions: Return if symptoms worsen or fail to improve.   Orders:  Orders Placed This Encounter  Procedures  . XR HIP UNILAT W OR W/O PELVIS 2-3 VIEWS RIGHT  . XR Lumbar Spine 2-3 Views   Meds ordered this encounter  Medications  . diclofenac (VOLTAREN) 75 MG EC tablet    Sig: Take 1 tablet (75 mg total) by mouth 2 (two) times daily.    Dispense:  30 tablet    Refill:  2  . predniSONE (STERAPRED UNI-PAK 21 TAB) 10 MG (21) TBPK tablet    Sig: Take as directed    Dispense:  21 tablet    Refill:  0      Procedures: No procedures performed   Clinical Data: No additional findings.   Subjective: Chief Complaint  Patient presents with  . Right Hip - Pain  . Lower Back - Pain    HPI  72 year old male with 5 days of right-sided low back pain with radiation into the right leg.  Patient denies any specific injury.  He has a history of L4/5 microdiscectomy in 2004 in [place].  Today he reports at x7/10 pain that is worse with standing or walking.  It improves with sitting.  When walking he notices the pain rating to the lateral aspect of his foot.  No significant numbness or  tingling.  He has not noticed any weakness of the leg.  He denies any bowel/bladder symptoms.  No saddle anesthesia.  No recent fevers or chills.  He has been taking diclofenac and Tylenol which are minimally helpful.   Review of Systems See HPI  Objective: Vital Signs: There were no vitals taken for this visit.  Physical Exam GEN: Awake, alert, no acute distress Pulmonary: Breathing unlabored  Ortho Exam Lumbar spine: - Inspection: no gross deformity or asymmetry, swelling or ecchymosis - Palpation: Tenderness over the paraspinals at L5 level.  No spinous process tenderness. - ROM: full active ROM of the lumbar spine in flexion and extension without pain - Strength: 5/5 strength of lower extremity in L4-S1 nerve root distributions b/l - Neuro: sensation intact in the L4-S1 nerve root distribution b/l, 2+ L4 and S1 reflexes - Special testing: Negative straight leg raise    Specialty Comments:  No specialty comments available.  Imaging: Xr Hip Unilat W Or W/o Pelvis 2-3 Views Right  Result Date: 12/03/2017 No acute or structural abnormalities  Xr Lumbar Spine 2-3 Views  Result Date: 12/03/2017 Grade 1 anterolisthesis of L4-5 with anterior spurring.  PMFS History: Patient Active Problem List   Diagnosis Date Noted  . Depression 03/21/2013  . Type II or unspecified type diabetes mellitus without mention of complication, uncontrolled 09/15/2012  . Pure hypercholesterolemia 09/15/2012  . BPH (benign prostatic hypertrophy) 09/15/2012   Past Medical History:  Diagnosis Date  . Hypercholesterolemia     Family History  Problem Relation Age of Onset  . Cancer Neg Hx     Past Surgical History:  Procedure Laterality Date  . MINOR HEMORRHOIDECTOMY N/A    Social History   Occupational History  . Not on file  Tobacco Use  . Smoking status: Former Smoker    Types: Cigarettes  . Smokeless tobacco: Never Used  Substance and Sexual Activity  . Alcohol use: Not on  file  . Drug use: Not on file  . Sexual activity: Not on file

## 2017-12-06 ENCOUNTER — Other Ambulatory Visit: Payer: Federal, State, Local not specified - PPO

## 2017-12-10 ENCOUNTER — Ambulatory Visit: Payer: Federal, State, Local not specified - PPO | Admitting: Endocrinology

## 2017-12-10 ENCOUNTER — Other Ambulatory Visit: Payer: Self-pay | Admitting: Endocrinology

## 2017-12-12 ENCOUNTER — Other Ambulatory Visit: Payer: Self-pay | Admitting: Endocrinology

## 2017-12-12 ENCOUNTER — Telehealth (INDEPENDENT_AMBULATORY_CARE_PROVIDER_SITE_OTHER): Payer: Self-pay

## 2017-12-12 ENCOUNTER — Other Ambulatory Visit (INDEPENDENT_AMBULATORY_CARE_PROVIDER_SITE_OTHER): Payer: Self-pay

## 2017-12-12 MED ORDER — HYDROCODONE-ACETAMINOPHEN 5-325 MG PO TABS: ORAL_TABLET | ORAL | 0 refills | Status: DC

## 2017-12-12 NOTE — Telephone Encounter (Signed)
Did he get any relief from the steroid dose pak?  Can offer him some norco #10.

## 2017-12-12 NOTE — Telephone Encounter (Signed)
I would recommend trying another dose pak as well as some robaxin 750 mg  TID prn #30 and norco 5/325 1-2 tab bid prn #20

## 2017-12-12 NOTE — Telephone Encounter (Signed)
Dose pack only gave him minimally temporary relief. Last Tues started dosepak. Went to [facility] Friday. Monday pain came back again. Patient would like to know if he has to come in  [place] to urgent care they prescribed Metaxalone. Which did not help.   CB [phone]

## 2017-12-12 NOTE — Telephone Encounter (Signed)
Please advise 

## 2017-12-12 NOTE — Telephone Encounter (Signed)
Patient called stated he was seen last Tuesday for sciatic pain.  He flew to [place] since his OV and now pain is much worse. He wanted to be seen today but advised Dr Roda Shutters only here this morning. He is having difficulty with walking and wants to know if Dr Roda Shutters has any suggestions. Please call [phone]

## 2017-12-13 ENCOUNTER — Other Ambulatory Visit (INDEPENDENT_AMBULATORY_CARE_PROVIDER_SITE_OTHER): Payer: Self-pay

## 2017-12-13 MED ORDER — METHOCARBAMOL 750 MG PO TABS: ORAL_TABLET | ORAL | 0 refills | Status: DC

## 2017-12-13 NOTE — Telephone Encounter (Signed)
RX READY FOR PICK UP  

## 2017-12-13 NOTE — Telephone Encounter (Signed)
Patient's wife called back and stating that he is in considerable amount of pain and seems to have lost about 5-6 lbs and is concerned and wanted to know if he should be seen.  I advised her that an RX of Hydrocodone was sent in for him and Dr. Roda Shutters wanted him to try that.

## 2017-12-17 ENCOUNTER — Encounter (INDEPENDENT_AMBULATORY_CARE_PROVIDER_SITE_OTHER): Payer: Self-pay | Admitting: Orthopaedic Surgery

## 2017-12-17 ENCOUNTER — Ambulatory Visit (INDEPENDENT_AMBULATORY_CARE_PROVIDER_SITE_OTHER): Payer: Federal, State, Local not specified - PPO | Admitting: Orthopaedic Surgery

## 2017-12-17 DIAGNOSIS — M545 Low back pain, unspecified: Secondary | ICD-10-CM

## 2017-12-17 DIAGNOSIS — G8929 Other chronic pain: Secondary | ICD-10-CM | POA: Diagnosis not present

## 2017-12-17 NOTE — Progress Notes (Signed)
   Office Visit Note   Patient: Kenneth Lawson           Date of Birth: [birth year]           MRN: [id number] Visit Date: 12/17/2017              Requested by: Clovis Riley, L.August Saucer, MD [address] [place], [place] [address] PCP: Clovis Riley, L.August Saucer, MD   Assessment & Plan: Visit Diagnoses:  1. Chronic low back pain, unspecified back pain laterality, unspecified whether sciatica present     Plan: Impression is right lower extremity lumbar radiculopathy.  At this point, we will obtain an MRI of the lumbar spine.  We will call him with the results.  We will then refer him to Dr. Alvester Morin for an epidural steroid injection.  I also provided the patient with a physical therapy prescription to start once his pain improves.  We will provide him with an IM Toradol injection today.  Follow-Up Instructions: Return if symptoms worsen or fail to improve.   Orders:  Orders Placed This Encounter  Procedures  . MR Lumbar Spine w/o contrast   No orders of the defined types were placed in this encounter.     Procedures: No procedures performed   Clinical Data: No additional findings.   Subjective: Chief Complaint  Patient presents with  . Lower Back - Follow-up    HPI patient is a pleasant 72 year old gentleman who presents to our clinic today with recurrent right lower extremity radicular pain.  He was seen in our office few weeks back where he is placed on a steroid taper and muscle relaxer.  He noticed moderate relief initially while the steroid taper, but his pain returned and has significantly worsened.  He is getting weakness to the right lower extremity and significant pain with weightbearing.  No bowel or bladder change and no saddle paresthesias.  He has been taking Norco which does appear to somewhat relax him.  His muscle relaxer does not seem to be helping very much.  Review of Systems as detailed in HPI.  All others reviewed and are negative.   Objective: Vital  Signs: There were no vitals taken for this visit.  Physical Exam follow-ups well-nourished gentleman no acute distress.  Alert and oriented x3.  Ortho Exam stable lumbar exam  Specialty Comments:  No specialty comments available.  Imaging: No results found.   PMFS History: Patient Active Problem List   Diagnosis Date Noted  . Chronic low back pain 12/17/2017  . Depression 03/21/2013  . Type II or unspecified type diabetes mellitus without mention of complication, uncontrolled 09/15/2012  . Pure hypercholesterolemia 09/15/2012  . BPH (benign prostatic hypertrophy) 09/15/2012   Past Medical History:  Diagnosis Date  . Hypercholesterolemia     Family History  Problem Relation Age of Onset  . Cancer Neg Hx     Past Surgical History:  Procedure Laterality Date  . MINOR HEMORRHOIDECTOMY N/A    Social History   Occupational History  . Not on file  Tobacco Use  . Smoking status: Former Smoker    Types: Cigarettes  . Smokeless tobacco: Never Used  Substance and Sexual Activity  . Alcohol use: Not on file  . Drug use: Not on file  . Sexual activity: Not on file

## 2017-12-18 ENCOUNTER — Telehealth (INDEPENDENT_AMBULATORY_CARE_PROVIDER_SITE_OTHER): Payer: Self-pay | Admitting: Orthopaedic Surgery

## 2017-12-18 ENCOUNTER — Encounter (INDEPENDENT_AMBULATORY_CARE_PROVIDER_SITE_OTHER): Payer: Self-pay

## 2017-12-18 NOTE — Telephone Encounter (Signed)
Letter made. °Patient aware. ° °

## 2017-12-18 NOTE — Telephone Encounter (Signed)
Just state that he has severe back pain and pinched nerve in his back.

## 2017-12-18 NOTE — Telephone Encounter (Signed)
Patient called stating needing work note to explain why he can't work this week.  Please call patient to advise

## 2017-12-18 NOTE — Telephone Encounter (Signed)
See message below °

## 2017-12-19 ENCOUNTER — Telehealth (INDEPENDENT_AMBULATORY_CARE_PROVIDER_SITE_OTHER): Payer: Self-pay | Admitting: Orthopaedic Surgery

## 2017-12-19 NOTE — Telephone Encounter (Signed)
Patient said his pharmacy said to contact provider since he had a paper prescription? It is for hydrocodone and the pharmacy is [facility] on [address]. I was slightly confused by what the patient was trying to say. Please advise if we can send in # [phone]

## 2017-12-20 ENCOUNTER — Other Ambulatory Visit (INDEPENDENT_AMBULATORY_CARE_PROVIDER_SITE_OTHER): Payer: Self-pay | Admitting: Orthopaedic Surgery

## 2017-12-20 ENCOUNTER — Encounter (INDEPENDENT_AMBULATORY_CARE_PROVIDER_SITE_OTHER): Payer: Self-pay | Admitting: Orthopaedic Surgery

## 2017-12-20 MED ORDER — HYDROCODONE-ACETAMINOPHEN 5-325 MG PO TABS: ORAL_TABLET | ORAL | 0 refills | Status: DC

## 2017-12-20 NOTE — Telephone Encounter (Signed)
IC advised could pick up at front desk to take to pharmacy.  

## 2017-12-20 NOTE — Telephone Encounter (Signed)
Ok to rf? 

## 2017-12-20 NOTE — Addendum Note (Signed)
Addended byPrescott Parma on: 12/20/2017 03:43 PM   Modules accepted: Orders

## 2017-12-20 NOTE — Telephone Encounter (Signed)
I have sent rxrf to Dr Roda Shutters. Waiting to be advised.

## 2017-12-23 ENCOUNTER — Ambulatory Visit
Admission: RE | Admit: 2017-12-23 | Discharge: 2017-12-23 | Disposition: A | Discharge status: Home / Self Care | Payer: Federal, State, Local not specified - PPO | Source: Ambulatory Visit | Attending: Orthopaedic Surgery | Admitting: Orthopaedic Surgery

## 2017-12-23 DIAGNOSIS — M545 Low back pain, unspecified: Secondary | ICD-10-CM

## 2017-12-23 DIAGNOSIS — G8929 Other chronic pain: Secondary | ICD-10-CM

## 2017-12-25 ENCOUNTER — Ambulatory Visit (INDEPENDENT_AMBULATORY_CARE_PROVIDER_SITE_OTHER): Payer: Federal, State, Local not specified - PPO | Admitting: Physician Assistant

## 2017-12-26 ENCOUNTER — Ambulatory Visit (INDEPENDENT_AMBULATORY_CARE_PROVIDER_SITE_OTHER): Payer: Federal, State, Local not specified - PPO | Admitting: Physician Assistant

## 2017-12-26 ENCOUNTER — Encounter (INDEPENDENT_AMBULATORY_CARE_PROVIDER_SITE_OTHER): Payer: Self-pay | Admitting: Physician Assistant

## 2017-12-26 DIAGNOSIS — G8929 Other chronic pain: Secondary | ICD-10-CM

## 2017-12-26 DIAGNOSIS — M545 Low back pain, unspecified: Secondary | ICD-10-CM

## 2017-12-26 MED ORDER — OXYCODONE-ACETAMINOPHEN 5-325 MG PO TABS: 1.0000 | ORAL_TABLET | Freq: Three times a day (TID) | ORAL | 0 refills | Status: DC | PRN

## 2017-12-26 NOTE — Progress Notes (Signed)
     Patient: Kenneth Lawson           Date of Birth: Jun 19, 1945           MRN: [id number] Visit Date: 12/26/2017 PCP: Clovis Riley, L.August Saucer, MD   Assessment & Plan:  Chief Complaint:  Chief Complaint  Patient presents with  . Lower Back - Pain, Follow-up   Visit Diagnoses:  1. Chronic low back pain, unspecified back pain laterality, unspecified whether sciatica present     Plan: Patient is a pleasant 72 year old gentleman who presents to clinic today to discuss MRI results of his lumbar spine.  MRI results from 12/23/2017 reveal a broad central disc protrusion contacting the bilateral intraspinal S1 nerve roots right greater than left at L5-S1.  He also has a broad-based disc bulge at L4-5 with moderate bilateral facet arthropathy.  At this point, we will refer the patient to Dr. Alvester Morin for an epidural steroid injection.  I will also go ahead and provide the patient with a physical therapy prescription for which she will start once he has improvement from the injection.  He will follow-up with [place] as needed.  Follow-Up Instructions: Return if symptoms worsen or fail to improve.   Orders:  Orders Placed This Encounter  Procedures  . Ambulatory referral to Physical Medicine Rehab   Meds ordered this encounter  Medications  . oxyCODONE-acetaminophen (PERCOCET) 5-325 MG tablet    Sig: Take 1-2 tablets by mouth 3 (three) times daily as needed for severe pain.    Dispense:  15 tablet    Refill:  0    Imaging: No new imaging  PMFS History: Patient Active Problem List   Diagnosis Date Noted  . Chronic low back pain 12/17/2017  . Depression 03/21/2013  . Type II or unspecified type diabetes mellitus without mention of complication, uncontrolled 09/15/2012  . Pure hypercholesterolemia 09/15/2012  . BPH (benign prostatic hypertrophy) 09/15/2012   Past Medical History:  Diagnosis Date  . Hypercholesterolemia     Family History  Problem Relation Age of Onset  . Cancer Neg Hx     Past Surgical History:  Procedure Laterality Date  . MINOR HEMORRHOIDECTOMY N/A    Social History   Occupational History  . Not on file  Tobacco Use  . Smoking status: Former Smoker    Types: Cigarettes  . Smokeless tobacco: Never Used  Substance and Sexual Activity  . Alcohol use: Not on file  . Drug use: Not on file  . Sexual activity: Not on file

## 2017-12-26 NOTE — Addendum Note (Signed)
Addended by: Albertina Parr on: 12/26/2017 09:46 AM   Modules accepted: Orders

## 2017-12-30 ENCOUNTER — Encounter (INDEPENDENT_AMBULATORY_CARE_PROVIDER_SITE_OTHER): Payer: Self-pay | Admitting: Orthopaedic Surgery

## 2017-12-30 ENCOUNTER — Other Ambulatory Visit: Payer: Self-pay | Admitting: Endocrinology

## 2017-12-31 ENCOUNTER — Telehealth (INDEPENDENT_AMBULATORY_CARE_PROVIDER_SITE_OTHER): Payer: Self-pay | Admitting: *Deleted

## 2017-12-31 ENCOUNTER — Telehealth (INDEPENDENT_AMBULATORY_CARE_PROVIDER_SITE_OTHER): Payer: Self-pay | Admitting: Orthopaedic Surgery

## 2017-12-31 NOTE — Telephone Encounter (Signed)
Kenneth Lawson, patients wife is requesting a refill on oxycodone to last him until he gets injection on 11/14. She also mentioned that Mardella Layman has suggested trying another muscle relaxer other than methocarbamol. Please advise # [phone]

## 2017-12-31 NOTE — Telephone Encounter (Signed)
#  10 tabs

## 2017-12-31 NOTE — Telephone Encounter (Signed)
Please advise 

## 2018-01-01 ENCOUNTER — Other Ambulatory Visit (INDEPENDENT_AMBULATORY_CARE_PROVIDER_SITE_OTHER): Payer: Self-pay

## 2018-01-01 MED ORDER — OXYCODONE-ACETAMINOPHEN 5-325 MG PO TABS: 1.0000 | ORAL_TABLET | Freq: Four times a day (QID) | ORAL | 0 refills | Status: DC | PRN

## 2018-01-01 NOTE — Telephone Encounter (Signed)
Do you know if [facility] can see him sooner?

## 2018-01-01 NOTE — Telephone Encounter (Signed)
Patient aware ready at front desk  

## 2018-01-01 NOTE — Telephone Encounter (Signed)
IC no answer. LMVM advising.  

## 2018-01-01 NOTE — Telephone Encounter (Signed)
[facility] per Jenel Lucks is scheduled til the 14th also.

## 2018-01-02 ENCOUNTER — Other Ambulatory Visit (INDEPENDENT_AMBULATORY_CARE_PROVIDER_SITE_OTHER): Payer: Self-pay | Admitting: Orthopaedic Surgery

## 2018-01-02 NOTE — Telephone Encounter (Signed)
yes

## 2018-01-02 NOTE — Telephone Encounter (Signed)
Please advise 

## 2018-01-02 NOTE — Telephone Encounter (Signed)
Ok for refill? 

## 2018-01-03 MED ORDER — METHOCARBAMOL 750 MG PO TABS: ORAL_TABLET | ORAL | 0 refills | Status: DC

## 2018-01-08 ENCOUNTER — Telehealth (INDEPENDENT_AMBULATORY_CARE_PROVIDER_SITE_OTHER): Payer: Self-pay | Admitting: Physical Medicine and Rehabilitation

## 2018-01-08 NOTE — Telephone Encounter (Signed)
Patient wife called would like to cancel appt for tomorrow

## 2018-01-08 NOTE — Telephone Encounter (Signed)
Appt cancelled

## 2018-01-09 ENCOUNTER — Encounter (INDEPENDENT_AMBULATORY_CARE_PROVIDER_SITE_OTHER): Payer: Self-pay | Admitting: Physical Medicine and Rehabilitation

## 2018-02-07 ENCOUNTER — Other Ambulatory Visit (INDEPENDENT_AMBULATORY_CARE_PROVIDER_SITE_OTHER): Payer: Federal, State, Local not specified - PPO

## 2018-02-07 DIAGNOSIS — E1165 Type 2 diabetes mellitus with hyperglycemia: Secondary | ICD-10-CM | POA: Diagnosis not present

## 2018-02-07 LAB — BASIC METABOLIC PANEL
BUN: 22 mg/dL (ref 6–23)
CO2: 25 mEq/L (ref 19–32)
Calcium: 8.9 mg/dL (ref 8.4–10.5)
Chloride: 109 mEq/L (ref 96–112)
Creatinine, Ser: 0.81 mg/dL (ref 0.40–1.50)
GFR: 99.95 mL/min (ref >60.00)
Glucose, Bld: 141 mg/dL — ABNORMAL HIGH (ref 70–99)
Potassium: 3.9 mEq/L (ref 3.5–5.1)
Sodium: 142 mEq/L (ref 135–145)

## 2018-02-07 LAB — HEMOGLOBIN A1C: Hgb A1c MFr Bld: 6.6 % — ABNORMAL HIGH (ref 4.6–6.5)

## 2018-02-07 LAB — MICROALBUMIN / CREATININE URINE RATIO
Creatinine,U: 58.7 mg/dL
Microalb Creat Ratio: 1.2 mg/g (ref 0.0–30.0)
Microalb, Ur: <0.7 mg/dL (ref 0.0–1.9)

## 2018-02-12 ENCOUNTER — Ambulatory Visit: Payer: Federal, State, Local not specified - PPO | Admitting: Endocrinology

## 2018-02-14 ENCOUNTER — Encounter: Payer: Self-pay | Admitting: Endocrinology

## 2018-02-14 ENCOUNTER — Ambulatory Visit: Payer: Federal, State, Local not specified - PPO | Admitting: Endocrinology

## 2018-02-14 VITALS — BP 152/88 | HR 87 | Ht 68.0 in | Wt 173.0 lb

## 2018-02-14 DIAGNOSIS — E1165 Type 2 diabetes mellitus with hyperglycemia: Secondary | ICD-10-CM

## 2018-02-14 LAB — GLUCOSE, POCT (MANUAL RESULT ENTRY): POC Glucose: 116 mg/dl — AB (ref 70–99)

## 2018-02-14 NOTE — Progress Notes (Signed)
Patient ID: Kenneth Lawson, male   DOB: [birth year], 72 y.o.   MRN: [id number]   Reason for Appointment: Diabetes follow-up    History of Present Illness    Diagnosis: date of diagnosis: 1997.   Previous history: He was previously treated with glyburide, Actos and metformin.  Metformin was stopped because he had GI side effects  His blood sugars had been relatively high in the last 3-4 years and A1c had been consistently over 7.5% until his initial consultation He was started on Invokana in 10/13 which had helped modestly. His blood sugars had shown considerable improvement with  adding  Nesina since 2/14 .    He was also on Actos which he had stopped but blood sugars did not appear to worsen. .  The previous HbgA1c levels: 6.4 in 4/14,  7.4 in 12/13, prior range from 7.8-8.3 since 2011   RECENT history:   Oral hypoglycemic drugs: Oseni 25/30, Invokana 300mg , Amaryl 4 mg twice daily  His blood sugars are overall usually well controlled with A1c under 7   A1c is 6.5 compared to 6.7  Current blood sugar patterns, management and problems identified:  He has again forgotten to bring his monitor  Although previously had lost a significant amount of weight he has gained most of this back now  Recently with his back pain improving he has been able to walk 2 miles or more fairly regularly  His glyburide was changed to Amaryl on the last visit to reduce potential for hypoglycemia  Has checked his blood sugars very infrequently and only on 6 days in the last month  As before he may occasionally have readings around 180+ if he has more carbohydrate in his diet  Otherwise he is reporting his 90-day average on his meter to be about 120  No side effects from Invokana  No hypoglycemic symptoms    Side effects from medications: bloating, gas and GI distress from metformin, even 500 mg, occasional balanitis from Invokana  Monitors blood glucose: Every 2 or 3 days    Glucometer: Accucheck. Blood Glucose readings as above Meter not available   Physical activity: exercise: He is walking outside, about 2 miles daily with his dog,   Dietician visit: Most recent: 1997 at diagnosis.  .   Wt Readings from Last 3 Encounters:  02/14/18 173 lb (78.5 kg)  09/09/17 157 lb 9.6 oz (71.5 kg)  05/09/17 162 lb (73.5 kg)    Lab Results  Component Value Date   HGBA1C 6.6 (H) 02/07/2018   HGBA1C 6.7 (H) 09/04/2017   HGBA1C 6.7 (H) 05/06/2017   Lab Results  Component Value Date   MICROALBUR <0.7 02/07/2018   LDLCALC 83 09/04/2017   CREATININE 0.81 02/07/2018    OTHER active problems: See review of systems   Office Visit on 02/14/2018  Component Date Value Ref Range Status  . POC Glucose 02/14/2018 116* 70 - 99 mg/dl Final    Allergies as of 02/14/2018   No Known Allergies     Medication List       Accurate as of February 14, 2018 10:51 AM. Always use your most recent med list.        ACCU-CHEK AVIVA PLUS test strip Generic drug:  glucose blood 2 (two) times daily.   ACCU-CHEK SOFTCLIX LANCETS lancets   alfuzosin 10 MG 24 hr tablet Commonly known as:  UROXATRAL Take 10 mg by mouth daily.   Alogliptin-Pioglitazone 25-30 MG Tabs TAKE 1 TABLET DAILY  ALPRAZolam 0.5 MG tablet Commonly known as:  XANAX   atorvastatin 80 MG tablet Commonly known as:  LIPITOR TAKE 1 TABLET DAILY   buPROPion 300 MG 24 hr tablet Commonly known as:  WELLBUTRIN XL Take 300 mg by mouth daily.   diclofenac 75 MG EC tablet Commonly known as:  VOLTAREN Take 1 tablet (75 mg total) by mouth 2 (two) times daily.   finasteride 5 MG tablet Commonly known as:  PROSCAR   glimepiride 4 MG tablet Commonly known as:  AMARYL TAKE 1 TABLET (4 MG TOTAL) BY MOUTH 2 (TWO) TIMES DAILY BEFORE A MEAL.   glyBURIDE 5 MG tablet Commonly known as:  DIABETA Takes 2 (10mg ) daily.   hydrocortisone-pramoxine 2.5-1 % rectal cream Commonly known as:  ANALPRAM-HC    JARDIANCE 25 MG Tabs tablet Generic drug:  empagliflozin TAKE 1 TABLET BY MOUTH EVERY DAY   methocarbamol 750 MG tablet Commonly known as:  ROBAXIN TAKE 1 TAB PO TID prn PAIN   mometasone 50 MCG/ACT nasal spray Commonly known as:  NASONEX USE 2 SPRAYS IN EACH NOSTRIL NASALLY ONCE A DAY AS NEEDED   nystatin cream Commonly known as:  MYCOSTATIN APPLY TO AFFECTED AREA TWICE A DAY   omeprazole 40 MG capsule Commonly known as:  PRILOSEC as needed.   predniSONE 10 MG (21) Tbpk tablet Commonly known as:  STERAPRED UNI-PAK 21 TAB Take as directed   sildenafil 25 MG tablet Commonly known as:  VIAGRA Take 25 mg by mouth daily as needed for erectile dysfunction.       Allergies: No Known Allergies  Past Medical History:  Diagnosis Date  . Hypercholesterolemia     Past Surgical History:  Procedure Laterality Date  . MINOR HEMORRHOIDECTOMY N/A     Family History  Problem Relation Age of Onset  . Cancer Neg Hx     Social History:  reports that he has quit smoking. His smoking use included cigarettes. He has never used smokeless tobacco. No history on file for alcohol and drug.  Review of Systems:   Eye Exam last: 5/19; no retinopathy noted  HYPERLIPIDEMIA: Has been taking 80 mg Lipitor and LDL is below 100   Lab Results  Component Value Date   CHOL 151 09/04/2017   HDL 48.20 09/04/2017   LDLCALC 83 09/04/2017   LDLDIRECT 134.6 11/18/2013   TRIG 99.0 09/04/2017   CHOLHDL 3 09/04/2017     Diabetic foot exam in 04/2017 showed normal monofilament sensation in the toes and plantar surfaces, no skin lesions or ulcers on the feet and normal pedal pulses      Examination:   BP (!) 152/88 (BP Location: Left Arm, Patient Position: Sitting, Cuff Size: Normal)   Pulse 87   Ht 5\' 8"  (1.727 m)   Wt 173 lb (78.5 kg)   SpO2 98%   BMI 26.30 kg/m   Body mass index is 26.3 kg/m.     Assesment/Plan:   Diabetes type 2 with BMI 26  See history of present illness  for detailed discussion of current diabetes management, blood sugar patterns and problems identified  His A1c is consistently below 7% and now relatively better at 6.5  His control is excellent and may be related to overall better diet and recently consistently walking Also may be doing better with postprandial readings with Amaryl compared to glyburide No hypoglycemia with 4 mg twice daily Difficult to analyze his glucose at home because he did not bring his meter  His treatment regimen  will continue unchanged  Follow-up in 4 months   There are no Patient Instructions on file for this visit.   Reather LittlerAjay Lucca Greggs 02/14/2018, 10:51 AM

## 2018-03-13 ENCOUNTER — Other Ambulatory Visit: Payer: Self-pay | Admitting: Endocrinology

## 2018-03-14 ENCOUNTER — Telehealth: Payer: Self-pay | Admitting: Endocrinology

## 2018-03-14 ENCOUNTER — Other Ambulatory Visit: Payer: Self-pay

## 2018-03-14 MED ORDER — EMPAGLIFLOZIN 25 MG PO TABS: 25.0000 mg | ORAL_TABLET | Freq: Every day | ORAL | 2 refills | Status: DC

## 2018-03-14 NOTE — Telephone Encounter (Signed)
MEDICATION: JARDIANCE 25 MG TABS tablet  PHARMACY:  [facility] [place], [place] - [address] AT [facility]  IS THIS A 90 DAY SUPPLY : yes  IS PATIENT OUT OF MEDICATION: No   IF NOT; HOW MUCH IS LEFT: 20 Tablets   LAST APPOINTMENT DATE: @1 /16/2020  NEXT APPOINTMENT DATE:@4 /16/2020  DO WE HAVE YOUR PERMISSION TO LEAVE A DETAILED MESSAGE:   OTHER COMMENTS:    **Let patient know to contact pharmacy at the end of the day to make sure medication is ready. **  ** Please notify patient to allow 48-72 hours to process**  **Encourage patient to contact the pharmacy for refills or they can request refills through [facility]**

## 2018-03-14 NOTE — Telephone Encounter (Signed)
Rx sent 

## 2018-03-17 ENCOUNTER — Other Ambulatory Visit: Payer: Self-pay | Admitting: Endocrinology

## 2018-03-30 ENCOUNTER — Other Ambulatory Visit: Payer: Self-pay | Admitting: Endocrinology

## 2018-05-01 ENCOUNTER — Other Ambulatory Visit: Payer: Self-pay | Admitting: Endocrinology

## 2018-06-10 ENCOUNTER — Telehealth: Payer: Self-pay | Admitting: Endocrinology

## 2018-06-10 ENCOUNTER — Telehealth: Payer: Self-pay

## 2018-06-10 NOTE — Telephone Encounter (Signed)
He is intolerant to metformin with GI side effects.  Can do PA if needed

## 2018-06-10 NOTE — Telephone Encounter (Signed)
PA initiated for Jardiance 25mg  take 1 tablet by mouth once daily.  KeyRolly Pancake   PA Case ID: [id number]

## 2018-06-10 NOTE — Telephone Encounter (Signed)
Patient states that [facility] is refusing to fill his empagliflozin (JARDIANCE) 25 MG TABS tablet because they are wanting him to try Metformin. He does not want to and says that the doctor has to decline before filling the other.  Please Advise, Thanks

## 2018-06-10 NOTE — Telephone Encounter (Signed)
Do you want PA done? Or change medication?

## 2018-06-10 NOTE — Telephone Encounter (Signed)
PA initiated

## 2018-06-12 ENCOUNTER — Other Ambulatory Visit: Payer: Self-pay

## 2018-06-12 ENCOUNTER — Other Ambulatory Visit (INDEPENDENT_AMBULATORY_CARE_PROVIDER_SITE_OTHER): Payer: Federal, State, Local not specified - PPO

## 2018-06-12 DIAGNOSIS — E1165 Type 2 diabetes mellitus with hyperglycemia: Secondary | ICD-10-CM | POA: Diagnosis not present

## 2018-06-12 LAB — HEMOGLOBIN A1C: Hgb A1c MFr Bld: 7.1 % — ABNORMAL HIGH (ref 4.6–6.5)

## 2018-06-12 LAB — COMPREHENSIVE METABOLIC PANEL
ALT: 21 U/L (ref 0–53)
AST: 13 U/L (ref 0–37)
Albumin: 4.1 g/dL (ref 3.5–5.2)
Alkaline Phosphatase: 73 U/L (ref 39–117)
BUN: 25 mg/dL — ABNORMAL HIGH (ref 6–23)
CO2: 26 mEq/L (ref 19–32)
Calcium: 9.2 mg/dL (ref 8.4–10.5)
Chloride: 107 mEq/L (ref 96–112)
Creatinine, Ser: 1.08 mg/dL (ref 0.40–1.50)
GFR: 67.41 mL/min (ref >60.00)
Glucose, Bld: 189 mg/dL — ABNORMAL HIGH (ref 70–99)
Potassium: 3.7 mEq/L (ref 3.5–5.1)
Sodium: 141 mEq/L (ref 135–145)
Total Bilirubin: 0.6 mg/dL (ref 0.2–1.2)
Total Protein: 6.7 g/dL (ref 6.0–8.3)

## 2018-06-12 NOTE — Telephone Encounter (Signed)
Received fax from [employer] stating that the PA for Kenneth Lawson has been approved. Approval is good from 05/11/2018 through 06/10/2019.

## 2018-06-13 ENCOUNTER — Other Ambulatory Visit: Payer: Self-pay

## 2018-06-16 ENCOUNTER — Ambulatory Visit (INDEPENDENT_AMBULATORY_CARE_PROVIDER_SITE_OTHER): Payer: Federal, State, Local not specified - PPO | Admitting: Endocrinology

## 2018-06-16 ENCOUNTER — Other Ambulatory Visit: Payer: Self-pay

## 2018-06-16 ENCOUNTER — Encounter: Payer: Self-pay | Admitting: Endocrinology

## 2018-06-16 DIAGNOSIS — E1165 Type 2 diabetes mellitus with hyperglycemia: Secondary | ICD-10-CM | POA: Diagnosis not present

## 2018-06-16 DIAGNOSIS — E7800 Pure hypercholesterolemia, unspecified: Secondary | ICD-10-CM

## 2018-06-16 NOTE — Progress Notes (Signed)
Patient ID: Kenneth Lawson, male   DOB: [birth year], 73 y.o.   MRN: [id number]   Today's office visit was provided via telemedicine using video technique Explained to the patient and the the limitations of evaluation and management by telemedicine and the availability of in person appointments.  The patient understood the limitations and agreed to proceed. Patient also understood that the telehealth visit is billable. . Location of the patient: Home . Location of the provider: Office Only the patient and myself were participating in the encounter    Reason for Appointment: Diabetes follow-up    History of Present Illness    Diagnosis: date of diagnosis: 1997.   Previous history: He was previously treated with glyburide, Actos and metformin.  Metformin was stopped because he had GI side effects  His blood sugars had been relatively high in the last 3-4 years and A1c had been consistently over 7.5% until his initial consultation He was started on Invokana in 10/13 which had helped modestly. His blood sugars had shown considerable improvement with  adding  Nesina since 2/14 .    He was also on Actos which he had stopped but blood sugars did not appear to worsen. .  The previous HbgA1c levels: 6.4 in 4/14,  7.4 in 12/13, prior range from 7.8-8.3 since 2011   RECENT history:   Oral hypoglycemic drugs: Oseni 25/30, Invokana 300mg , Amaryl 4 mg twice daily  His blood sugars are overall usually well controlled with A1c under 7   A1c is higher at 7.1 compared to 6.6  Current blood sugar patterns, management and problems identified:  He has not been able to watch his diet and getting more carbohydrates in his meals and snacks  He says that he has had difficulty with his depression and has also had difficulty dealing with staying indoors and recently retiring also  Checking blood sugars very sporadically again with some high readings, average for the last 3 months at home is  161  He thinks he is taking his medications regularly although he does need a prior authorization for his Invokana  Although he has periodically been walking in the past he is not doing much regularly now His glucose in the lab was 189 but not clear if this was after or before breakfast   Side effects from medications: bloating, gas and GI distress from metformin, even 500 mg, occasional balanitis from Invokana  Monitors blood glucose: Every 2 or 3 days   Glucometer: Accucheck. Blood Glucose readings from history from his home meter   PRE-MEAL Fasting Lunch Dinner Bedtime Overall  Glucose range: ?    131, 135   Mean/median:      161   POST-MEAL PC Breakfast PC Lunch PC Dinner  Glucose range:   203, 192   Mean/median:        Dietician visit: Most recent: 1997 at diagnosis.  .   Wt Readings from Last 3 Encounters:  02/14/18 173 lb (78.5 kg)  09/09/17 157 lb 9.6 oz (71.5 kg)  05/09/17 162 lb (73.5 kg)    Lab Results  Component Value Date   HGBA1C 7.1 (H) 06/12/2018   HGBA1C 6.6 (H) 02/07/2018   HGBA1C 6.7 (H) 09/04/2017   Lab Results  Component Value Date   MICROALBUR <0.7 02/07/2018   LDLCALC 83 09/04/2017   CREATININE 1.08 06/12/2018    OTHER active problems: See review of systems   Lab on 06/12/2018  Component Date Value Ref Range Status  . Sodium  06/12/2018 141  135 - 145 mEq/L Final  . Potassium 06/12/2018 3.7  3.5 - 5.1 mEq/L Final  . Chloride 06/12/2018 107  96 - 112 mEq/L Final  . CO2 06/12/2018 26  19 - 32 mEq/L Final  . Glucose, Bld 06/12/2018 189* 70 - 99 mg/dL Final  . BUN 16/10/960404/16/2020 25* 6 - 23 mg/dL Final  . Creatinine, Ser 06/12/2018 1.08  0.40 - 1.50 mg/dL Final  . Total Bilirubin 06/12/2018 0.6  0.2 - 1.2 mg/dL Final  . Alkaline Phosphatase 06/12/2018 73  39 - 117 U/L Final  . AST 06/12/2018 13  0 - 37 U/L Final  . ALT 06/12/2018 21  0 - 53 U/L Final  . Total Protein 06/12/2018 6.7  6.0 - 8.3 g/dL Final  . Albumin 54/09/811904/16/2020 4.1  3.5 - 5.2  g/dL Final  . Calcium 14/78/295604/16/2020 9.2  8.4 - 10.5 mg/dL Final  . GFR 21/30/865704/16/2020 67.41  >60.00 mL/min Final  . Hgb A1c MFr Bld 06/12/2018 7.1* 4.6 - 6.5 % Final   Glycemic Control Guidelines for People with Diabetes:Non Diabetic:  <6%Goal of Therapy: <7%Additional Action Suggested:  >8%     Allergies as of 06/16/2018   No Known Allergies     Medication List       Accurate as of June 16, 2018  9:12 AM. Always use your most recent med list.        Accu-Chek Aviva Plus test strip Generic drug:  glucose blood 2 (two) times daily.   Accu-Chek Softclix Lancets lancets   alfuzosin 10 MG 24 hr tablet Commonly known as:  UROXATRAL Take 10 mg by mouth daily.   Alogliptin-Pioglitazone 25-30 MG Tabs TAKE 1 TABLET DAILY   ALPRAZolam 0.5 MG tablet Commonly known as:  XANAX Take 0.5 mg by mouth every other day as needed. Take 1/2 tablet by mouth every other day as needed.   atorvastatin 80 MG tablet Commonly known as:  LIPITOR TAKE 1 TABLET DAILY   buPROPion 300 MG 24 hr tablet Commonly known as:  WELLBUTRIN XL Take 300 mg by mouth daily.   empagliflozin 25 MG Tabs tablet Commonly known as:  Jardiance Take 25 mg by mouth daily.   finasteride 5 MG tablet Commonly known as:  PROSCAR   glimepiride 4 MG tablet Commonly known as:  AMARYL TAKE 1 TABLET (4 MG TOTAL) BY MOUTH 2 (TWO) TIMES DAILY BEFORE A MEAL.   hydrocortisone-pramoxine 2.5-1 % rectal cream Commonly known as:  ANALPRAM-HC   mometasone 50 MCG/ACT nasal spray Commonly known as:  NASONEX USE 2 SPRAYS IN EACH NOSTRIL NASALLY ONCE A DAY AS NEEDED   nystatin cream Commonly known as:  MYCOSTATIN APPLY TO AFFECTED AREA TWICE A DAY   omeprazole 40 MG capsule Commonly known as:  PRILOSEC as needed.   sildenafil 25 MG tablet Commonly known as:  VIAGRA Take 25 mg by mouth daily as needed for erectile dysfunction.       Allergies: No Known Allergies  Past Medical History:  Diagnosis Date  .  Hypercholesterolemia     Past Surgical History:  Procedure Laterality Date  . MINOR HEMORRHOIDECTOMY N/A     Family History  Problem Relation Age of Onset  . Cancer Neg Hx     Social History:  reports that he has quit smoking. His smoking use included cigarettes. He has never used smokeless tobacco. No history on file for alcohol and drug.  Review of Systems:   Eye Exam last: 5/19; no retinopathy noted  HYPERLIPIDEMIA: Has been taking 80 mg Lipitor and LDL has been below 100   Lab Results  Component Value Date   CHOL 151 09/04/2017   HDL 48.20 09/04/2017   LDLCALC 83 09/04/2017   LDLDIRECT 134.6 11/18/2013   TRIG 99.0 09/04/2017   CHOLHDL 3 09/04/2017     Diabetic foot exam in 04/2017 showed normal monofilament sensation in the toes and plantar surfaces, no skin lesions or ulcers on the feet and normal pedal pulses      Examination:   There were no vitals taken for this visit.  There is no height or weight on file to calculate BMI.     Assesment/Plan:   Diabetes type 2 without significant obesity  See history of present illness for detailed discussion of current diabetes management, blood sugar patterns and problems identified  His A1c previously had been consistently below 7% and now 7.1  Although on his previous visit he had done well with his diet and exercise regimen he has not been able to watch his diet, check his sugars regularly or walk for exercise Not clear if he has gained weight as this was a remote visit Also taking blood sugar very erratically and will have some high readings at times He is compliant with his medications  Since he recently started back with his Wellbutrin and counseling sessions he may be more motivated to do better with his lifestyle Also encouraged him to check his sugars more often to keep up with his blood sugars and this will provide him better feedback on how to adjust his diet and be more consistent Encouraged him to start  walking regularly  In the meantime will not change his medications and continue his 4 drug regimen  Lipids: Will check on the next visit  Follow-up in 3 months   There are no Patient Instructions on file for this visit.   Reather Littler 06/16/2018, 9:12 AM

## 2018-06-28 ENCOUNTER — Ambulatory Visit (HOSPITAL_COMMUNITY)
Admission: EM | Admit: 2018-06-28 | Discharge: 2018-06-28 | Disposition: A | Discharge status: Home / Self Care | Payer: Federal, State, Local not specified - PPO | Attending: Family Medicine | Admitting: Family Medicine

## 2018-06-28 ENCOUNTER — Other Ambulatory Visit: Payer: Self-pay

## 2018-06-28 ENCOUNTER — Ambulatory Visit (INDEPENDENT_AMBULATORY_CARE_PROVIDER_SITE_OTHER): Payer: Federal, State, Local not specified - PPO

## 2018-06-28 DIAGNOSIS — R0789 Other chest pain: Secondary | ICD-10-CM

## 2018-06-28 MED ORDER — LIDOCAINE VISCOUS HCL 2 % MT SOLN
15.0000 mL | Freq: Once | OROMUCOSAL | Status: AC
  Administered 2018-06-28: 15 mL via ORAL

## 2018-06-28 MED ORDER — LIDOCAINE VISCOUS HCL 2 % MT SOLN
OROMUCOSAL | Status: AC
  Filled 2018-06-28: qty 15

## 2018-06-28 MED ORDER — ALUM & MAG HYDROXIDE-SIMETH 200-200-20 MG/5ML PO SUSP
30.0000 mL | Freq: Once | ORAL | Status: AC
  Administered 2018-06-28: 30 mL via ORAL

## 2018-06-28 MED ORDER — ALUM & MAG HYDROXIDE-SIMETH 200-200-20 MG/5ML PO SUSP
ORAL | Status: AC
  Filled 2018-06-28: qty 30

## 2018-06-28 NOTE — ED Triage Notes (Signed)
Pt has hx anxiety attacks and has been having panic attacks about Covid  and can't sleep. Pt went to [facility] and got a good work up. Pt says he can't quit thinking about things and has been having pain again.

## 2018-06-28 NOTE — Discharge Instructions (Addendum)
Continue naprosyn for chest discomfort EKG showed relatively normal EKG, did have premature atrial complex Chest Xray normal Follow up with Primary care if symptoms persisting Follow up here or in emergency room if symptoms worsening or changing

## 2018-06-29 NOTE — ED Provider Notes (Signed)
[facility]    CSN: [id number] Arrival date & time: 06/28/18  1506     History   Chief Complaint No chief complaint on file.   HPI Kenneth Lawson is a 73 y.o. male history of DM type II, BPH, hyperlipidemia presenting today for evaluation of anxiety and chest discomfort.  Patient notes that recently over the past couple weeks he has developed chest discomfort off and on.  Most recently he has developed a burning tender sensation around his left nipple.  He feels as if the needle has been stuck in it.  He was recently seen at [facility] walk-in clinic and work-up was normal.  He notes that since the COVID pandemic has been going on he has had increased anxiety.  He is on Wellbutrin and takes Xanax as needed.  He notes that he is very concerned about getting a virus.  He denies any triggers for his chest discomfort including denying exertional, positional, eating, breathing eliciting pain.  He does think when he is thinking about COVID he notices worsening chest discomfort.  He denies associated nausea or vomiting.  Is eating and drinking like normal.  Denies associated shortness of breath, cough.  Denies any fevers chills or body aches.  Denies any leg pain or leg swelling.  When he was previously seen it was attributed to possible costochondritis and was prescribed Naprosyn.  He had slight improvement with this.  His main concern is ruling out cardiac and pulmonary causes of chest discomfort.  He is a previous smoker, but quit approximately 50 years ago.  He is also concerned about lung cancer.  Denies any weight changes or night sweats.  Weight has remained stable around 160.  HPI  Past Medical History:  Diagnosis Date  . Hypercholesterolemia     Patient Active Problem List   Diagnosis Date Noted  . Chronic low back pain 12/17/2017  . Depression 03/21/2013  . Type II or unspecified type diabetes mellitus without mention of complication, uncontrolled 09/15/2012  . Pure  hypercholesterolemia 09/15/2012  . BPH (benign prostatic hypertrophy) 09/15/2012    Past Surgical History:  Procedure Laterality Date  . MINOR HEMORRHOIDECTOMY N/A        Home Medications    Prior to Admission medications   Medication Sig Start Date End Date Taking? Authorizing Provider  ACCU-CHEK AVIVA PLUS test strip 2 (two) times daily. 10/22/12   [provider]  ACCU-CHEK SOFTCLIX LANCETS lancets  04/03/13   [provider]  alfuzosin (UROXATRAL) 10 MG 24 hr tablet Take 10 mg by mouth daily.    [provider]  Alogliptin-Pioglitazone 25-30 MG TABS TAKE 1 TABLET DAILY 03/31/18   Reather Littler, MD  ALPRAZolam Prudy Feeler) 0.5 MG tablet Take 0.5 mg by mouth every other day as needed. Take 1/2 tablet by mouth every other day as needed. 10/20/13   [provider]  atorvastatin (LIPITOR) 80 MG tablet TAKE 1 TABLET DAILY 05/01/18   Reather Littler, MD  buPROPion (WELLBUTRIN XL) 300 MG 24 hr tablet Take 300 mg by mouth daily.    [provider]  empagliflozin (JARDIANCE) 25 MG TABS tablet Take 25 mg by mouth daily. 03/14/18   Reather Littler, MD  finasteride (PROSCAR) 5 MG tablet  01/20/14   [provider]  glimepiride (AMARYL) 4 MG tablet TAKE 1 TABLET (4 MG TOTAL) BY MOUTH 2 (TWO) TIMES DAILY BEFORE A MEAL. 03/17/18   Reather Littler, MD  hydrocortisone-pramoxine [facility]) 2.5-1 % rectal cream  03/12/13  [provider]  mometasone (NASONEX) 50 MCG/ACT nasal spray USE 2 SPRAYS IN EACH NOSTRIL NASALLY ONCE A DAY AS NEEDED 09/11/14   [provider]  nystatin cream (MYCOSTATIN) APPLY TO AFFECTED AREA TWICE A DAY Patient taking differently: Apply to affected area twice daily as needed. 12/30/17   Reather Littler, MD  omeprazole (PRILOSEC) 40 MG capsule as needed. 10/22/12   [provider]  sildenafil (VIAGRA) 25 MG tablet Take 25 mg by mouth daily as needed for erectile dysfunction.    [provider]    Family History Family  History  Problem Relation Age of Onset  . Cancer Neg Hx     Social History Social History   Tobacco Use  . Smoking status: Former Smoker    Types: Cigarettes  . Smokeless tobacco: Never Used  Substance Use Topics  . Alcohol use: Not on file  . Drug use: Not on file     Allergies   Patient has no known allergies.   Review of Systems Review of Systems  Constitutional: Negative for activity change, appetite change, chills, fatigue and fever.  HENT: Negative for congestion, ear pain, rhinorrhea, sinus pressure, sore throat and trouble swallowing.   Eyes: Negative for photophobia, pain, discharge, redness and visual disturbance.  Respiratory: Negative for cough, chest tightness and shortness of breath.   Cardiovascular: Positive for chest pain.  Gastrointestinal: Negative for abdominal pain, diarrhea, nausea and vomiting.  Genitourinary: Negative for decreased urine volume and hematuria.  Musculoskeletal: Negative for myalgias, neck pain and neck stiffness.  Skin: Negative for rash.  Neurological: Negative for dizziness, syncope, facial asymmetry, speech difficulty, weakness, light-headedness, numbness and headaches.     Physical Exam Triage Vital Signs ED Triage Vitals  Enc Vitals Group     BP 06/28/18 1531 (!) 169/83     Pulse Rate 06/28/18 1531 89     Resp 06/28/18 1531 18     Temp 06/28/18 1531 (!) 97.5 F (36.4 C)     Temp Source 06/28/18 1531 Oral     SpO2 06/28/18 1531 97 %     Weight --      Height --      Head Circumference --      Peak Flow --      Pain Score 06/28/18 1529 0     Pain Loc --      Pain Edu? --      Excl. in GC? --    No data found.  Updated Vital Signs BP (!) 169/83 (BP Location: Right Arm)   Pulse 89   Temp (!) 97.5 F (36.4 C) (Oral)   Resp 18   SpO2 97%   Visual Acuity Right Eye Distance:   Left Eye Distance:   Bilateral Distance:    Right Eye Near:   Left Eye Near:    Bilateral Near:     Physical Exam Vitals signs  and nursing note reviewed.  Constitutional:      General: He is not in acute distress.    Appearance: He is well-developed.  HENT:     Head: Normocephalic and atraumatic.     Mouth/Throat:     Comments: Oral mucosa pink and moist, no tonsillar enlargement or exudate. Posterior pharynx patent and nonerythematous, no uvula deviation or swelling. Normal phonation. Eyes:     Extraocular Movements: Extraocular movements intact.     Conjunctiva/sclera: Conjunctivae normal.     Pupils: Pupils are equal, round, and reactive to light.  Comments: Wearing glasses  Neck:     Musculoskeletal: Neck supple.  Cardiovascular:     Rate and Rhythm: Normal rate and regular rhythm.     Heart sounds: No murmur.  Pulmonary:     Effort: Pulmonary effort is normal. No respiratory distress.     Breath sounds: Normal breath sounds.     Comments: Breathing comfortably at rest, CTABL, no wheezing, rales or other adventitious sounds auscultated   Anterior chest largely nontender to palpation, mild discomfort with palpation around the left nipple Chest:     Chest wall: No tenderness.  Abdominal:     Palpations: Abdomen is soft.     Tenderness: There is no abdominal tenderness.     Comments: Soft, nondistended, no previous surgical scars noted, nontender to light and deep palpation throughout all 4 quadrants and epigastrium of abdomen  Skin:    General: Skin is warm and dry.     Comments: No notable skin changes around left nipple  Neurological:     General: No focal deficit present.     Mental Status: He is alert and oriented to person, place, and time. Mental status is at baseline.      UC Treatments / Results  Labs (all labs ordered are listed, but only abnormal results are displayed) Labs Reviewed - No data to display  EKG None  Radiology Dg Chest 2 View  Result Date: 06/28/2018 CLINICAL DATA:  Left-sided chest discomfort for 1 week EXAM: CHEST - 2 VIEW COMPARISON:  None. FINDINGS: The  heart size and mediastinal contours are within normal limits. Both lungs are clear. The visualized skeletal structures are unremarkable. IMPRESSION: No active cardiopulmonary disease. Electronically Signed   By: Elige Ko   On: 06/28/2018 16:56    Procedures Procedures (including critical care time)  Medications Ordered in UC Medications  alum & mag hydroxide-simeth (MAALOX/MYLANTA) 200-200-20 MG/5ML suspension 30 mL (30 mLs Oral Given 06/28/18 1644)    And  lidocaine (XYLOCAINE) 2 % viscous mouth solution 15 mL (15 mLs Oral Given 06/28/18 1644)    Initial Impression / Assessment and Plan / UC Course  I have reviewed the triage vital signs and the nursing notes.  Pertinent labs & imaging results that were available during my care of the patient were reviewed by me and considered in my medical decision making (see chart for details).     Chest x-ray normal, EKG normal sinus rhythm premature atrial no acute signs of ischemia or infarction or arrhythmia.  Discussed with patient these do not definitively of 100% out cardiac cause, but vital signs, along with EKG is reassuring.  Symptoms seem less likely cardiac origin.  Do not suspect underlying DVT or PE at this time.  No tachycardia or hypoxia.  Chest discomfort most likely chest wall pain versus anxiety.  Will recommend follow-up with PCP follow-up with cardiologist as PACs unclear if this is new for longstanding.  May benefit from repeat stress test if this is not been recent.  Advised to continue to monitor, Naprosyn as needed,Discussed strict return precautions. Patient verbalized understanding and is agreeable with plan.  Final Clinical Impressions(s) / UC Diagnoses   Final diagnoses:  Atypical chest pain     Discharge Instructions     Continue naprosyn for chest discomfort EKG showed relatively normal EKG, did have premature atrial complex Chest Xray normal Follow up with Primary care if symptoms persisting Follow up here or in  emergency room if symptoms worsening or changing   ED  Prescriptions    None     Controlled Substance Prescriptions [place] Controlled Substance Registry consulted? Not Applicable   Lew DawesWieters, Kaena Santori C, [place]PA-C 06/29/18 [id number]

## 2018-09-10 ENCOUNTER — Other Ambulatory Visit: Payer: Self-pay | Admitting: Endocrinology

## 2018-09-11 ENCOUNTER — Other Ambulatory Visit: Payer: Self-pay | Admitting: Endocrinology

## 2018-09-22 ENCOUNTER — Other Ambulatory Visit (INDEPENDENT_AMBULATORY_CARE_PROVIDER_SITE_OTHER): Payer: Federal, State, Local not specified - PPO

## 2018-09-22 ENCOUNTER — Other Ambulatory Visit: Payer: Self-pay

## 2018-09-22 DIAGNOSIS — E1165 Type 2 diabetes mellitus with hyperglycemia: Secondary | ICD-10-CM

## 2018-09-22 DIAGNOSIS — E7800 Pure hypercholesterolemia, unspecified: Secondary | ICD-10-CM

## 2018-09-22 LAB — BASIC METABOLIC PANEL
BUN: 28 mg/dL — ABNORMAL HIGH (ref 6–23)
CO2: 24 mEq/L (ref 19–32)
Calcium: 8.8 mg/dL (ref 8.4–10.5)
Chloride: 110 mEq/L (ref 96–112)
Creatinine, Ser: 0.95 mg/dL (ref 0.40–1.50)
GFR: 78.1 mL/min (ref >60.00)
Glucose, Bld: 162 mg/dL — ABNORMAL HIGH (ref 70–99)
Potassium: 3.8 mEq/L (ref 3.5–5.1)
Sodium: 141 mEq/L (ref 135–145)

## 2018-09-22 LAB — LIPID PANEL
Cholesterol: 150 mg/dL (ref 0–200)
HDL: 41.8 mg/dL (ref >39.00)
LDL Cholesterol: 85 mg/dL (ref 0–99)
NonHDL: 108.45
Total CHOL/HDL Ratio: 4
Triglycerides: 119 mg/dL (ref 0.0–149.0)
VLDL: 23.8 mg/dL (ref 0.0–40.0)

## 2018-09-22 LAB — HEMOGLOBIN A1C: Hgb A1c MFr Bld: 6.3 % (ref 4.6–6.5)

## 2018-09-25 ENCOUNTER — Encounter: Payer: Self-pay | Admitting: Endocrinology

## 2018-09-25 ENCOUNTER — Other Ambulatory Visit: Payer: Self-pay

## 2018-09-25 ENCOUNTER — Ambulatory Visit (INDEPENDENT_AMBULATORY_CARE_PROVIDER_SITE_OTHER): Payer: Federal, State, Local not specified - PPO | Admitting: Endocrinology

## 2018-09-25 DIAGNOSIS — E7800 Pure hypercholesterolemia, unspecified: Secondary | ICD-10-CM

## 2018-09-25 DIAGNOSIS — E1165 Type 2 diabetes mellitus with hyperglycemia: Secondary | ICD-10-CM | POA: Diagnosis not present

## 2018-09-25 NOTE — Progress Notes (Signed)
Patient ID: Kenneth Lawson, male   DOB: [birth year], 73 y.o.   MRN: [id number]   Today's office visit was provided via telemedicine using video technique Explained to the patient and the the limitations of evaluation and management by telemedicine and the availability of in person appointments.  The patient understood the limitations and agreed to proceed. Patient also understood that the telehealth visit is billable. . Location of the patient: Home . Location of the provider: Office Only the patient and myself were participating in the encounter    Reason for Appointment: Diabetes follow-up    History of Present Illness    Diagnosis: date of diagnosis: 1997.   Previous history: He was previously treated with glyburide, Actos and metformin.  Metformin was stopped because he had GI side effects  His blood sugars had been relatively high in the last 3-4 years and A1c had been consistently over 7.5% until his initial consultation He was started on Invokana in 10/13 which had helped modestly. His blood sugars had shown considerable improvement with  adding  Nesina since 2/14 .    He was also on Actos which he had stopped but blood sugars did not appear to worsen. .  The previous HbgA1c levels: 6.4 in 4/14,  7.4 in 12/13, prior range from 7.8-8.3 since 2011   RECENT history:   Oral hypoglycemic drugs: Oseni 25/30, Jardiance 25 mg, Amaryl 4 mg twice daily  His blood sugars are overall usually well controlled with A1c under 7   A1c is improved at 6.3, was at 7.1  Current blood sugar patterns, management and problems identified:  He has overall done better with trying to be consistent with controlling portions and carbohydrates and snacks  Previously had difficulties with compliance because of dealing with depression and anxiety  Although he has been switched to Jardiance from [place] his blood sugars are still fairly good  He is now having occasional readings in the  60s in the mornings but without any perceived symptoms  He has tried to walk up to 2 miles on most days, limited by hot weather  Currently fasting blood sugars are relatively lower and only has a few postprandial readings after breakfast which are variable depending on his carbohydrate intake  Checking blood sugars mostly when he feels tired otherwise not after lunch or dinner   Side effects from medications: bloating, gas and GI distress from metformin, even 500 mg, occasional balanitis from Invokana  Monitors blood glucose: Every 2 or 3 days   Glucometer: [place]. Blood Glucose readings from history from his home meter    PRE-MEAL Fasting Lunch Dinner Bedtime Overall  Glucose range:  62-136   100  144   Mean/median:      145   POST-MEAL PC Breakfast PC Lunch PC Dinner  Glucose range:  139-196    Mean/median:      Previous average 161   Dietician visit: Most recent: 1997 at diagnosis.  .   Wt Readings from Last 3 Encounters:  02/14/18 173 lb (78.5 kg)  09/09/17 157 lb 9.6 oz (71.5 kg)  05/09/17 162 lb (73.5 kg)    Lab Results  Component Value Date   HGBA1C 6.3 09/22/2018   HGBA1C 7.1 (H) 06/12/2018   HGBA1C 6.6 (H) 02/07/2018   Lab Results  Component Value Date   MICROALBUR <0.7 02/07/2018   LDLCALC 85 09/22/2018   CREATININE 0.95 09/22/2018    OTHER active problems: See review of systems   Lab on 09/22/2018  Component Date Value Ref Range Status  . Cholesterol 09/22/2018 150  0 - 200 mg/dL Final   ATP III Classification       Desirable:  < 200 mg/dL               Borderline High:  200 - 239 mg/dL          High:  > = 784240 mg/dL  . Triglycerides 09/22/2018 119.0  0.0 - 149.0 mg/dL Final   Normal:  <696<150 mg/dLBorderline High:  150 - 199 mg/dL  . HDL 09/22/2018 41.80  >39.00 mg/dL Final  . VLDL 29/52/841307/27/2020 23.8  0.0 - 40.0 mg/dL Final  . LDL Cholesterol 09/22/2018 85  0 - 99 mg/dL Final  . Total CHOL/HDL Ratio 09/22/2018 4   Final                  Men           Women1/2 Average Risk     3.4          3.3Average Risk          5.0          4.42X Average Risk          9.6          7.13X Average Risk          15.0          11.0                      . NonHDL 09/22/2018 108.45   Final   NOTE:  Non-HDL goal should be 30 mg/dL higher than patient's LDL goal (i.e. LDL goal of < 70 mg/dL, would have non-HDL goal of < 100 mg/dL)  . Sodium 09/22/2018 141  135 - 145 mEq/L Final  . Potassium 09/22/2018 3.8  3.5 - 5.1 mEq/L Final  . Chloride 09/22/2018 110  96 - 112 mEq/L Final  . CO2 09/22/2018 24  19 - 32 mEq/L Final  . Glucose, Bld 09/22/2018 162* 70 - 99 mg/dL Final  . BUN 24/40/102707/27/2020 28* 6 - 23 mg/dL Final  . Creatinine, Ser 09/22/2018 0.95  0.40 - 1.50 mg/dL Final  . Calcium 25/36/644007/27/2020 8.8  8.4 - 10.5 mg/dL Final  . GFR 34/74/259507/27/2020 78.10  >60.00 mL/min Final  . Hgb A1c MFr Bld 09/22/2018 6.3  4.6 - 6.5 % Final   Glycemic Control Guidelines for People with Diabetes:Non Diabetic:  <6%Goal of Therapy: <7%Additional Action Suggested:  >8%     Allergies as of 09/25/2018   No Known Allergies     Medication List       Accurate as of September 25, 2018  9:56 AM. If you have any questions, ask your nurse or doctor.        Accu-Chek Aviva Plus test strip Generic drug: glucose blood 2 (two) times daily.   Accu-Chek Softclix Lancets lancets   alfuzosin 10 MG 24 hr tablet Commonly known as: UROXATRAL Take 10 mg by mouth daily.   Alogliptin-Pioglitazone 25-30 MG Tabs TAKE 1 TABLET DAILY   ALPRAZolam 0.5 MG tablet Commonly known as: XANAX Take 0.5 mg by mouth every other day as needed. Take 1/2 tablet by mouth every other day as needed.   atorvastatin 80 MG tablet Commonly known as: LIPITOR TAKE 1 TABLET DAILY   buPROPion 300 MG 24 hr tablet Commonly known as: WELLBUTRIN XL Take 300 mg by mouth daily.   empagliflozin 25 MG Tabs  tablet Commonly known as: Jardiance Take 25 mg by mouth daily.   finasteride 5 MG tablet Commonly known as: PROSCAR    glimepiride 4 MG tablet Commonly known as: AMARYL TAKE 1 TABLET (4 MG TOTAL) BY MOUTH 2 (TWO) TIMES DAILY BEFORE A MEAL.   hydrocortisone-pramoxine 2.5-1 % rectal cream Commonly known as: ANALPRAM-HC   mometasone 50 MCG/ACT nasal spray Commonly known as: NASONEX USE 2 SPRAYS IN EACH NOSTRIL NASALLY ONCE A DAY AS NEEDED   nystatin cream Commonly known as: MYCOSTATIN Apply to affected area twice daily as needed.   omeprazole 40 MG capsule Commonly known as: PRILOSEC as needed.   sildenafil 25 MG tablet Commonly known as: VIAGRA Take 25 mg by mouth daily as needed for erectile dysfunction.       Allergies: No Known Allergies  Past Medical History:  Diagnosis Date  . Hypercholesterolemia     Past Surgical History:  Procedure Laterality Date  . MINOR HEMORRHOIDECTOMY N/A     Family History  Problem Relation Age of Onset  . Cancer Neg Hx     Social History:  reports that he has quit smoking. His smoking use included cigarettes. He has never used smokeless tobacco. No history on file for alcohol and drug.  Review of Systems:   Eye Exam last: 7/20; no retinopathy noted  HYPERLIPIDEMIA: Has been taking 80 mg Lipitor and LDL has been below 100   Lab Results  Component Value Date   CHOL 150 09/22/2018   HDL 41.80 09/22/2018   LDLCALC 85 09/22/2018   LDLDIRECT 134.6 11/18/2013   TRIG 119.0 09/22/2018   CHOLHDL 4 09/22/2018     Diabetic foot exam in 04/2017 showed normal monofilament sensation in the toes and plantar surfaces, no skin lesions or ulcers on the feet and normal pedal pulses      Examination:   There were no vitals taken for this visit.  There is no height or weight on file to calculate BMI.     Assesment/Plan:   Diabetes type 2 without significant obesity  See history of present illness for detailed discussion of current diabetes management, blood sugar patterns and problems identified  His A1c is excellent at 6.2  He has done  generally better with improving his diet most of the time and also trying to walk regularly He appears to be more motivated also Blood sugars have been monitored mostly in the mornings Recently he has only 1 relatively high reading of 192 after breakfast However he has had readings as low as 62 although asymptomatic and not clear if his new meter is accurate  Discussed that Amaryl is likely to cause low normal blood sugars in the mornings Especially with her not having any symptoms and should check some readings at bedtime also now For now we will cut down the Amaryl to half a tablet of the 4 mg dose at dinnertime and if he continues to have low sugars in the mornings may eliminate that in the evening completely He will continue other medications unchanged  Lipids: Well controlled  Follow-up in 3 months   There are no Patient Instructions on file for this visit.   Reather LittlerAjay Queen Abbett 09/25/2018, 9:56 AM

## 2018-09-30 ENCOUNTER — Other Ambulatory Visit: Payer: Self-pay

## 2018-09-30 MED ORDER — ACCU-CHEK GUIDE W/DEVICE KIT: 1.0000 | PACK | Freq: Every day | 0 refills | Status: DC

## 2018-10-05 ENCOUNTER — Other Ambulatory Visit: Payer: Self-pay | Admitting: Endocrinology

## 2018-10-06 NOTE — Telephone Encounter (Signed)
Okay to refill it 1 time

## 2018-10-06 NOTE — Telephone Encounter (Signed)
Please advise if you wish to refill 

## 2018-10-06 NOTE — Telephone Encounter (Signed)
Confirm he needs it again

## 2018-10-06 NOTE — Telephone Encounter (Signed)
Called pt to inquire further. Confirmed he is still taking this medication

## 2018-11-05 ENCOUNTER — Other Ambulatory Visit: Payer: Self-pay | Admitting: Endocrinology

## 2018-12-17 ENCOUNTER — Other Ambulatory Visit: Payer: Self-pay | Admitting: Endocrinology

## 2018-12-23 ENCOUNTER — Other Ambulatory Visit (INDEPENDENT_AMBULATORY_CARE_PROVIDER_SITE_OTHER): Payer: Federal, State, Local not specified - PPO

## 2018-12-23 ENCOUNTER — Other Ambulatory Visit: Payer: Self-pay

## 2018-12-23 DIAGNOSIS — E1165 Type 2 diabetes mellitus with hyperglycemia: Secondary | ICD-10-CM | POA: Diagnosis not present

## 2018-12-23 LAB — MICROALBUMIN / CREATININE URINE RATIO
Creatinine,U: 177.6 mg/dL
Microalb Creat Ratio: 0.7 mg/g (ref 0.0–30.0)
Microalb, Ur: 1.3 mg/dL (ref 0.0–1.9)

## 2018-12-23 LAB — COMPREHENSIVE METABOLIC PANEL
ALT: 18 U/L (ref 0–53)
AST: 16 U/L (ref 0–37)
Albumin: 4 g/dL (ref 3.5–5.2)
Alkaline Phosphatase: 75 U/L (ref 39–117)
BUN: 24 mg/dL — ABNORMAL HIGH (ref 6–23)
CO2: 26 mEq/L (ref 19–32)
Calcium: 8.9 mg/dL (ref 8.4–10.5)
Chloride: 108 mEq/L (ref 96–112)
Creatinine, Ser: 0.82 mg/dL (ref 0.40–1.50)
GFR: 92.48 mL/min (ref >60.00)
Glucose, Bld: 86 mg/dL (ref 70–99)
Potassium: 3.6 mEq/L (ref 3.5–5.1)
Sodium: 140 mEq/L (ref 135–145)
Total Bilirubin: 0.7 mg/dL (ref 0.2–1.2)
Total Protein: 6.5 g/dL (ref 6.0–8.3)

## 2018-12-23 LAB — HEMOGLOBIN A1C: Hgb A1c MFr Bld: 6.5 % (ref 4.6–6.5)

## 2018-12-24 ENCOUNTER — Other Ambulatory Visit: Payer: Self-pay

## 2018-12-26 ENCOUNTER — Ambulatory Visit: Payer: Federal, State, Local not specified - PPO | Admitting: Endocrinology

## 2018-12-26 ENCOUNTER — Other Ambulatory Visit: Payer: Self-pay

## 2018-12-26 ENCOUNTER — Encounter: Payer: Self-pay | Admitting: Endocrinology

## 2018-12-26 VITALS — BP 140/70 | HR 80 | Ht 68.0 in | Wt 166.0 lb

## 2018-12-26 DIAGNOSIS — E1165 Type 2 diabetes mellitus with hyperglycemia: Secondary | ICD-10-CM | POA: Diagnosis not present

## 2018-12-26 NOTE — Progress Notes (Signed)
Patient ID: Kenneth Lawson, male   DOB: 1945/04/07, 73 y.o.   MRN: [id number]     Reason for Appointment: Diabetes follow-up    History of Present Illness    Diagnosis: date of diagnosis: 1997.   Previous history: He was previously treated with glyburide, Actos and metformin.  Metformin was stopped because he had GI side effects  His blood sugars had been relatively high in the last 3-4 years and A1c had been consistently over 7.5% until his initial consultation He was started on Invokana in 10/13 which had helped modestly. His blood sugars had shown considerable improvement with  adding  Nesina since 2/14 .    He was also on Actos which he had stopped but blood sugars did not appear to worsen. .  The previous HbgA1c levels: 6.4 in 4/14,  7.4 in 12/13, prior range from 7.8-8.3 since 2011   RECENT history:   Oral hypoglycemic drugs: Oseni 25/30, Jardiance 25 mg, Amaryl 8 mg daily  His blood sugars are overall usually well controlled with A1c under 7   A1c is about the same at 6.5  Current blood sugar patterns, management and problems identified:  He was told to reduce his evening Amaryl to half a tablet  However he did not remember his instructions and is taking 8 mg in the evenings at dinnertime once a day  Has a few low blood sugars in the mornings although he did not think he feels any differently than with blood sugars as low as 59  Checking blood sugars only sporadically later in the day and these are mostly high  Most of her high sugars are related to eating too many carbohydrates  Because of back pain he has not exercised  Most of his fasting blood sugars are fairly good except when he has low sugars   Side effects from medications: bloating, gas and GI distress from metformin, even 500 mg, occasional balanitis from Invokana  Monitors blood glucose: About once a day  Glucometer: . Blood Glucose readings from monitor download  POST-MEAL PC Breakfast  PC Lunch PC Dinner  Glucose range:   94-228  160-178  Mean/median:   174    Previous readings:  PRE-MEAL Fasting Lunch Dinner Bedtime Overall  Glucose range:  62-136   100  144   Mean/median:      145   POST-MEAL PC Breakfast PC Lunch PC Dinner  Glucose range:  139-196    Mean/median:          Dietician visit: Most recent: 1997 at diagnosis.  .   Wt Readings from Last 3 Encounters:  12/26/18 166 lb (75.3 kg)  02/14/18 173 lb (78.5 kg)  09/09/17 157 lb 9.6 oz (71.5 kg)    Lab Results  Component Value Date   HGBA1C 6.5 12/23/2018   HGBA1C 6.3 09/22/2018   HGBA1C 7.1 (H) 06/12/2018   Lab Results  Component Value Date   MICROALBUR 1.3 12/23/2018   LDLCALC 85 09/22/2018   CREATININE 0.82 12/23/2018    OTHER active problems: See review of systems   Lab on 12/23/2018  Component Date Value Ref Range Status  . Microalb, Ur 12/23/2018 1.3  0.0 - 1.9 mg/dL Final  . Creatinine,U 12/23/2018 177.6  mg/dL Final  . Microalb Creat Ratio 12/23/2018 0.7  0.0 - 30.0 mg/g Final  . Sodium 12/23/2018 140  135 - 145 mEq/L Final  . Potassium 12/23/2018 3.6  3.5 - 5.1 mEq/L Final  . Chloride 12/23/2018 108  96 - 112 mEq/L Final  . CO2 12/23/2018 26  19 - 32 mEq/L Final  . Glucose, Bld 12/23/2018 86  70 - 99 mg/dL Final  . BUN 12/23/2018 24* 6 - 23 mg/dL Final  . Creatinine, Ser 12/23/2018 0.82  0.40 - 1.50 mg/dL Final  . Total Bilirubin 12/23/2018 0.7  0.2 - 1.2 mg/dL Final  . Alkaline Phosphatase 12/23/2018 75  39 - 117 U/L Final  . AST 12/23/2018 16  0 - 37 U/L Final  . ALT 12/23/2018 18  0 - 53 U/L Final  . Total Protein 12/23/2018 6.5  6.0 - 8.3 g/dL Final  . Albumin 12/23/2018 4.0  3.5 - 5.2 g/dL Final  . Calcium 12/23/2018 8.9  8.4 - 10.5 mg/dL Final  . GFR 12/23/2018 92.48  >60.00 mL/min Final  . Hgb A1c MFr Bld 12/23/2018 6.5  4.6 - 6.5 % Final   Glycemic Control Guidelines for People with Diabetes:Non Diabetic:  <6%Goal of Therapy: <7%Additional Action Suggested:  >8%      Allergies as of 12/26/2018   No Known Allergies     Medication List       Accurate as of December 26, 2018 10:36 AM. If you have any questions, ask your nurse or doctor.        Accu-Chek Aviva Plus test strip Generic drug: glucose blood 2 (two) times daily.   Accu-Chek Guide w/Device Kit 1 each by Does not apply route daily. Use accu chek guide meter to check blood sugar once daily.   Accu-Chek Softclix Lancets lancets   alfuzosin 10 MG 24 hr tablet Commonly known as: UROXATRAL Take 10 mg by mouth daily.   Alogliptin-Pioglitazone 25-30 MG Tabs TAKE 1 TABLET DAILY   ALPRAZolam 0.5 MG tablet Commonly known as: XANAX Take 0.5 mg by mouth every other day as needed. Take 1/2 tablet by mouth every other day as needed.   atorvastatin 80 MG tablet Commonly known as: LIPITOR TAKE 1 TABLET DAILY   buPROPion 300 MG 24 hr tablet Commonly known as: WELLBUTRIN XL Take 300 mg by mouth daily.   finasteride 5 MG tablet Commonly known as: PROSCAR   glimepiride 4 MG tablet Commonly known as: AMARYL TAKE 1 TABLET (4 MG TOTAL) BY MOUTH 2 (TWO) TIMES DAILY BEFORE A MEAL. What changed:   how much to take  when to take this  additional instructions   hydrocortisone-pramoxine 2.5-1 % rectal cream Commonly known as: ANALPRAM-HC   Jardiance 25 MG Tabs tablet Generic drug: empagliflozin TAKE 1 TABLET DAILY   mometasone 50 MCG/ACT nasal spray Commonly known as: NASONEX USE 2 SPRAYS IN EACH NOSTRIL NASALLY ONCE A DAY AS NEEDED   nystatin cream Commonly known as: MYCOSTATIN APPLY TO AFFECTED AREA TWICE A DAY AS NEEDED   omeprazole 40 MG capsule Commonly known as: PRILOSEC as needed.   sildenafil 25 MG tablet Commonly known as: VIAGRA Take 25 mg by mouth daily as needed for erectile dysfunction.       Allergies: No Known Allergies  Past Medical History:  Diagnosis Date  . Hypercholesterolemia     Past Surgical History:  Procedure Laterality Date  . MINOR  HEMORRHOIDECTOMY N/A     Family History  Problem Relation Age of Onset  . Cancer Neg Hx     Social History:  reports that he has quit smoking. His smoking use included cigarettes. He has never used smokeless tobacco. No history on file for alcohol and drug.  Review of Systems:   Eye  Exam last: 7/20; no retinopathy noted  HYPERLIPIDEMIA: Has been taking 80 mg Lipitor and LDL has been below 100   Lab Results  Component Value Date   CHOL 150 09/22/2018   HDL 41.80 09/22/2018   LDLCALC 85 09/22/2018   LDLDIRECT 134.6 11/18/2013   TRIG 119.0 09/22/2018   CHOLHDL 4 09/22/2018     Diabetic foot exam in 04/2017 showed normal monofilament sensation in the toes and plantar surfaces, no skin lesions or ulcers on the feet and normal pedal pulses  He is having significant back pain and is due for an epidural shot, he thinks he may have to go for surgery, currently in pain management   Examination:   BP 140/70 (BP Location: Left Arm, Patient Position: Sitting, Cuff Size: Normal)   Pulse 80   Ht '5\' 8"'  (1.727 m)   Wt 166 lb (75.3 kg)   SpO2 96%   BMI 25.24 kg/m   Body mass index is 25.24 kg/m.     Assesment/Plan:   Diabetes type 2 without significant obesity  See history of present illness for detailed discussion of current diabetes management, blood sugar patterns and problems identified  His A1c is 6.5   He has inconsistent blood sugars even though his A1c is about the same He is having tendency to occasional low sugars in the mornings at times Also has periodic high readings after meals when he checks, as high as 228 recently He did not reduce his Amaryl as directed on the last visit  Now will reduce his Amaryl to 4 mg and take it only in the morning and none in the evening Discussed that he may need to go up temporarily if he has a steroid injection  Hopefully he can start walking once his back pain is better As before reminded him to cut back on portions of  carbohydrates at meals and snacks  He will continue other medications unchanged  Urine microalbumin is normal  Follow-up in 3 months   Patient Instructions  Take only 1/2 Glimeperide in pm  Check blood sugars on waking up 3-4  days a week  Also check blood sugars about 2 hours after meals and do this after different meals by rotation  Recommended blood sugar levels on waking up are 90-130 and about 2 hours after meal is 130-160  Please bring your blood sugar monitor to each visit, thank you      Elayne Snare 12/26/2018, 10:36 AM

## 2018-12-26 NOTE — Patient Instructions (Addendum)
   Check blood sugars on waking up 3-4  days a week  Also check blood sugars about 2 hours after meals and do this after different meals by rotation  Recommended blood sugar levels on waking up are 90-130 and about 2 hours after meal is 130-160  Please bring your blood sugar monitor to each visit, thank you  Take Glimepide 1 in am only Take 2 in am the day of shot

## 2019-01-30 ENCOUNTER — Other Ambulatory Visit: Payer: Self-pay | Admitting: Endocrinology

## 2019-02-09 ENCOUNTER — Other Ambulatory Visit: Payer: Self-pay | Admitting: Endocrinology

## 2019-02-13 ENCOUNTER — Telehealth: Payer: Self-pay

## 2019-02-13 ENCOUNTER — Other Ambulatory Visit (INDEPENDENT_AMBULATORY_CARE_PROVIDER_SITE_OTHER): Payer: Federal, State, Local not specified - PPO

## 2019-02-13 ENCOUNTER — Other Ambulatory Visit: Payer: Self-pay

## 2019-02-13 DIAGNOSIS — E1165 Type 2 diabetes mellitus with hyperglycemia: Secondary | ICD-10-CM | POA: Diagnosis not present

## 2019-02-13 NOTE — Telephone Encounter (Signed)
Patient was in office for labs and verbalized that he is having some discomfort and needed an antibiotic wanting to see if Dr could help with this      Please advise

## 2019-02-14 LAB — BASIC METABOLIC PANEL
BUN/Creatinine Ratio: 24 (ref 10–24)
BUN: 19 mg/dL (ref 8–27)
CO2: 23 mmol/L (ref 20–29)
Calcium: 9.2 mg/dL (ref 8.6–10.2)
Chloride: 103 mmol/L (ref 96–106)
Creatinine, Ser: 0.78 mg/dL (ref 0.76–1.27)
GFR calc Af Amer: 105 mL/min/{1.73_m2} (ref >59)
GFR calc non Af Amer: 91 mL/min/{1.73_m2} (ref >59)
Glucose: 134 mg/dL — ABNORMAL HIGH (ref 65–99)
Potassium: 3.8 mmol/L (ref 3.5–5.2)
Sodium: 140 mmol/L (ref 134–144)

## 2019-02-14 LAB — TSH: TSH: 2.17 u[IU]/mL (ref 0.450–4.500)

## 2019-02-14 LAB — HEMOGLOBIN A1C
Est. average glucose Bld gHb Est-mCnc: 134 mg/dL
Hgb A1c MFr Bld: 6.3 % — ABNORMAL HIGH (ref 4.8–5.6)

## 2019-02-16 ENCOUNTER — Encounter: Payer: Self-pay | Admitting: Endocrinology

## 2019-02-16 ENCOUNTER — Other Ambulatory Visit: Payer: Self-pay

## 2019-02-16 ENCOUNTER — Ambulatory Visit (INDEPENDENT_AMBULATORY_CARE_PROVIDER_SITE_OTHER): Payer: Federal, State, Local not specified - PPO | Admitting: Endocrinology

## 2019-02-16 DIAGNOSIS — N481 Balanitis: Secondary | ICD-10-CM | POA: Diagnosis not present

## 2019-02-16 DIAGNOSIS — E119 Type 2 diabetes mellitus without complications: Secondary | ICD-10-CM | POA: Diagnosis not present

## 2019-02-16 NOTE — Progress Notes (Signed)
Patient ID: Kenneth Lawson, male   DOB: 04-28-1945, 73 y.o.   MRN: [id number]  I connected with the above-named patient by video enabled telemedicine application and verified that I am speaking with the correct person. The patient was explained the limitations of evaluation and management by telemedicine and the availability of in person appointments.  Patient also understood that there may be a patient responsible charge related to this service . Location of the patient: Patient's home . Location of the provider: Physician office Only the patient and myself were participating in the encounter The patient understood the above statements and agreed to proceed.   Reason for Appointment: Diabetes follow-up    History of Present Illness    Diagnosis: date of diagnosis: 1997.   Previous history: He was previously treated with glyburide, Actos and metformin.  Metformin was stopped because he had GI side effects  His blood sugars had been relatively high in the last 3-4 years and A1c had been consistently over 7.5% until his initial consultation He was started on Invokana in 10/13 which had helped modestly. His blood sugars had shown considerable improvement with  adding  Nesina since 2/14 .    He was also on Actos which he had stopped but blood sugars did not appear to worsen. .  The previous HbgA1c levels: 6.4 in 4/14,  7.4 in 12/13, prior range from 7.8-8.3 since 2011   RECENT history:   Oral hypoglycemic drugs: Oseni 25/30, Jardiance 25 mg, Amaryl 4 mg daily  His blood sugars are overall usually well controlled with A1c under 7   A1c is about the same at 6.3  Current blood sugar patterns, management and problems identified:  He was told to stop his evening Amaryl  With this his morning sugars are not as low as before  However he was having more problems with penile itching and has not taken his Jardiance for couple of days  He is still randomly having high readings  mostly after breakfast or lunch when he has a lot of carbohydrate such as waffles this morning when his blood sugar went up to 250  He has not done as much walking because of various issues  Otherwise he thinks his weight is about the same as a couple of months ago  Lowest blood sugar 72 this week in the morning but he has not felt hypoglycemic  Usually not checking blood sugars after his evening meal to evaluate postprandial sugars   Side effects from medications: bloating, gas and GI distress from metformin, even 500 mg, occasional balanitis from Invokana  Monitors blood glucose: About once a day  Glucometer: . Blood Glucose readings from monitor download   PRE-MEAL Fasting  midday Dinner Bedtime Overall  Glucose range:  72-92  164-221  85-124    Mean/median:     ?   POST-MEAL PC Breakfast PC Lunch PC Dinner  Glucose range:  250   165  Mean/median:      Previous readings:  POST-MEAL PC Breakfast PC Lunch PC Dinner  Glucose range:   94-228  160-178  Mean/median:   174      Dietician visit: Most recent: 1997 at diagnosis.  .   Wt Readings from Last 3 Encounters:  12/26/18 166 lb (75.3 kg)  02/14/18 173 lb (78.5 kg)  09/09/17 157 lb 9.6 oz (71.5 kg)    Lab Results  Component Value Date   HGBA1C 6.3 (H) 02/13/2019   HGBA1C 6.5 12/23/2018   HGBA1C  6.3 09/22/2018   Lab Results  Component Value Date   MICROALBUR 1.3 12/23/2018   LDLCALC 85 09/22/2018   CREATININE 0.78 02/13/2019    OTHER active problems: See review of systems   Lab on 02/13/2019  Component Date Value Ref Range Status  . Hgb A1c MFr Bld 02/13/2019 6.3* 4.8 - 5.6 % Final   Comment:          Prediabetes: 5.7 - 6.4          Diabetes: >6.4          Glycemic control for adults with diabetes: <7.0   . Est. average glucose Bld gHb Est-m* 02/13/2019 134  mg/dL Final  . Glucose 02/13/2019 134* 65 - 99 mg/dL Final  . BUN 02/13/2019 19  8 - 27 mg/dL Final  . Creatinine, Ser 02/13/2019 0.78  0.76 -  1.27 mg/dL Final  . GFR calc non Af Amer 02/13/2019 91  >59 mL/min/1.73 Final  . GFR calc Af Amer 02/13/2019 105  >59 mL/min/1.73 Final  . BUN/Creatinine Ratio 02/13/2019 24  10 - 24 Final  . Sodium 02/13/2019 140  134 - 144 mmol/L Final  . Potassium 02/13/2019 3.8  3.5 - 5.2 mmol/L Final  . Chloride 02/13/2019 103  96 - 106 mmol/L Final  . CO2 02/13/2019 23  20 - 29 mmol/L Final  . Calcium 02/13/2019 9.2  8.6 - 10.2 mg/dL Final  . TSH 02/13/2019 2.170  0.450 - 4.500 uIU/mL Final    Allergies as of 02/16/2019   No Known Allergies     Medication List       Accurate as of February 16, 2019  3:23 PM. If you have any questions, ask your nurse or doctor.        Accu-Chek Aviva Plus test strip Generic drug: glucose blood 2 (two) times daily.   Accu-Chek Guide w/Device Kit 1 each by Does not apply route daily. Use accu chek guide meter to check blood sugar once daily.   Accu-Chek Softclix Lancets lancets   alfuzosin 10 MG 24 hr tablet Commonly known as: UROXATRAL Take 10 mg by mouth daily.   Alogliptin-Pioglitazone 25-30 MG Tabs TAKE 1 TABLET DAILY   ALPRAZolam 0.5 MG tablet Commonly known as: XANAX Take 0.5 mg by mouth every other day as needed. Take 1/2 tablet by mouth every other day as needed.   atorvastatin 80 MG tablet Commonly known as: LIPITOR TAKE 1 TABLET DAILY   buPROPion 300 MG 24 hr tablet Commonly known as: WELLBUTRIN XL Take 300 mg by mouth daily.   finasteride 5 MG tablet Commonly known as: PROSCAR   glimepiride 4 MG tablet Commonly known as: AMARYL TAKE 1 TABLET (4 MG TOTAL) BY MOUTH 2 (TWO) TIMES DAILY BEFORE A MEAL. What changed:   how much to take  when to take this  additional instructions   hydrocortisone-pramoxine 2.5-1 % rectal cream Commonly known as: ANALPRAM-HC   Jardiance 25 MG Tabs tablet Generic drug: empagliflozin TAKE 1 TABLET DAILY   mometasone 50 MCG/ACT nasal spray Commonly known as: NASONEX USE 2 SPRAYS IN EACH  NOSTRIL NASALLY ONCE A DAY AS NEEDED   nystatin cream Commonly known as: MYCOSTATIN APPLY TO AFFECTED AREA TWICE A DAY AS NEEDED   omeprazole 40 MG capsule Commonly known as: PRILOSEC as needed.   sildenafil 25 MG tablet Commonly known as: VIAGRA Take 25 mg by mouth daily as needed for erectile dysfunction.       Allergies: No Known Allergies  Past  Medical History:  Diagnosis Date  . Hypercholesterolemia     Past Surgical History:  Procedure Laterality Date  . MINOR HEMORRHOIDECTOMY N/A     Family History  Problem Relation Age of Onset  . Cancer Neg Hx     Social History:  reports that he has quit smoking. His smoking use included cigarettes. He has never used smokeless tobacco. No history on file for alcohol and drug.  Review of Systems:   Eye Exam last: 7/20; no retinopathy noted  HYPERLIPIDEMIA: Has been taking 80 mg Lipitor and LDL has been below 100   Lab Results  Component Value Date   CHOL 150 09/22/2018   HDL 41.80 09/22/2018   LDLCALC 85 09/22/2018   LDLDIRECT 134.6 11/18/2013   TRIG 119.0 09/22/2018   CHOLHDL 4 09/22/2018     Diabetic foot exam in 04/2017 showed normal monofilament sensation in the toes and plantar surfaces, no skin lesions or ulcers on the feet and normal pedal pulses  He has had chronic back pain  He was seen by the urologist for penile itching and was given a combination of an antifungal and beclomethasone cream but he does not think it is helping much but usually does not have any redness or discharge    Examination:   There were no vitals taken for this visit.  There is no height or weight on file to calculate BMI.     Assesment/Plan:   Diabetes type 2 without significant obesity  See history of present illness for detailed discussion of current diabetes management, blood sugar patterns and problems identified  His A1c is 6.3, previously 6.5  Although his blood sugars are well controlled he is complaining of  penile pruritus despite using nystatin and also a combination steroid/antifungal cream from urologist.  Not clear if this is related to [facility] Since he is having persistent symptoms may be worth leaving it off for couple months  In place of Jardiance he agrees to try Rybelsus especially since he tends to have postprandial hyperglycemia Also needs to start walking more regularly as tolerated  Discussed with the patient the nature of GLP-1 drugs, the actions on insulin secretion, slowing stomach emptying, reduction of appetite and reduced liver glucose production Explained that Rybelsus improves blood sugar control as well as produces weight loss and reduces cardiovascular events. Explained possible side effects especially nausea and vomiting that may initially; usually side effects improved with time.  Patient to call if nausea or vomiting does not improve within 2 weeks Have explained the need to take the capsules on empty stomach 30 minutes before breakfast daily. He will start with samples and also included instruction booklet  He will call if he has any side effects but plan to increase the dose to 7 mg before his next refill Also discussed that if he has low blood sugars will need to cut back and stop Amaryl which he is taking only in the morning currently No change in [place] as yet but may consider adjust having him take pioglitazone subsequently if he is stable on Rybelsus  Follow-up as scheduled in February   There are no Patient Instructions on file for this visit.   Elayne Snare 02/16/2019, 3:23 PM

## 2019-02-16 NOTE — Telephone Encounter (Signed)
This will be discussed during pt's office visit today.

## 2019-02-22 ENCOUNTER — Other Ambulatory Visit: Payer: Self-pay | Admitting: Endocrinology

## 2019-03-16 ENCOUNTER — Ambulatory Visit: Payer: Federal, State, Local not specified - PPO | Attending: Internal Medicine

## 2019-03-19 ENCOUNTER — Other Ambulatory Visit: Payer: Self-pay

## 2019-03-19 MED ORDER — RYBELSUS 3 MG PO TABS: 1.0000 | ORAL_TABLET | Freq: Every day | ORAL | 1 refills | Status: DC

## 2019-03-26 ENCOUNTER — Ambulatory Visit: Payer: Federal, State, Local not specified - PPO

## 2019-03-30 ENCOUNTER — Other Ambulatory Visit: Payer: Federal, State, Local not specified - PPO

## 2019-04-02 ENCOUNTER — Ambulatory Visit: Payer: Federal, State, Local not specified - PPO | Admitting: Endocrinology

## 2019-04-03 ENCOUNTER — Ambulatory Visit: Payer: Federal, State, Local not specified - PPO | Attending: Internal Medicine

## 2019-04-03 DIAGNOSIS — Z23 Encounter for immunization: Secondary | ICD-10-CM | POA: Insufficient documentation

## 2019-04-03 NOTE — Progress Notes (Signed)
   Covid-19 Vaccination Clinic  Name:  Skyler Carel    MRN: [id number] DOB: 08-13-1945  04/03/2019  Mr. Lina was observed post Covid-19 immunization for 15 minutes without incidence. He was provided with Vaccine Information Sheet and instruction to access the V-Safe system.   Mr. Phenix was instructed to call 911 with any severe reactions post vaccine: Marland Kitchen Difficulty breathing  . Swelling of your face and throat  . A fast heartbeat  . A bad rash all over your body  . Dizziness and weakness    Immunizations Administered    Name Date Dose VIS Date Route   Pfizer COVID-19 Vaccine 04/03/2019  1:46 PM 0.3 mL 02/06/2019 Intramuscular   Manufacturer: [employer], [employer]   Lot: [id number]   NDC: [address]-8

## 2019-04-06 ENCOUNTER — Ambulatory Visit: Payer: Federal, State, Local not specified - PPO

## 2019-04-13 ENCOUNTER — Other Ambulatory Visit: Payer: Self-pay | Admitting: Endocrinology

## 2019-04-15 ENCOUNTER — Telehealth: Payer: Self-pay

## 2019-04-15 ENCOUNTER — Other Ambulatory Visit: Payer: Self-pay

## 2019-04-15 MED ORDER — GLUCOSE BLOOD VI STRP: ORAL_STRIP | 2 refills | Status: DC

## 2019-04-15 NOTE — Telephone Encounter (Signed)
Patient has called back to change this to Accu-chek Aviva test strips same pharmacy

## 2019-04-15 NOTE — Telephone Encounter (Signed)
MEDICATION: Accu-chek advantage test strips  PHARMACY:  [facility]  IS THIS A 90 DAY SUPPLY : yes  IS PATIENT OUT OF MEDICATION: no  IF NOT; HOW MUCH IS LEFT:2   LAST APPOINTMENT DATE: @2 /15/2021  NEXT APPOINTMENT DATE:@4 /06/2019  DO WE HAVE YOUR PERMISSION TO LEAVE A DETAILED MESSAGE:  OTHER COMMENTS: wrong Rx was sent he needs Accu-chek advantage sent-testing 3 times daily   **Let patient know to contact pharmacy at the end of the day to make sure medication is ready. **  ** Please notify patient to allow 48-72 hours to process**  **Encourage patient to contact the pharmacy for refills or they can request refills through [facility]**

## 2019-04-15 NOTE — Telephone Encounter (Signed)
Rx sent 

## 2019-04-23 ENCOUNTER — Other Ambulatory Visit: Payer: Self-pay | Admitting: Endocrinology

## 2019-04-23 NOTE — Telephone Encounter (Signed)
ok 

## 2019-04-23 NOTE — Telephone Encounter (Signed)
Ok to refill 

## 2019-04-23 NOTE — Telephone Encounter (Signed)
Noted  

## 2019-04-28 ENCOUNTER — Ambulatory Visit: Payer: Federal, State, Local not specified - PPO | Attending: Internal Medicine

## 2019-04-28 DIAGNOSIS — Z23 Encounter for immunization: Secondary | ICD-10-CM | POA: Insufficient documentation

## 2019-04-28 NOTE — Progress Notes (Signed)
   Covid-19 Vaccination Clinic  Name:  Kenneth Lawson    MRN: [id number] DOB: [birth year]  04/28/2019  [name] was observed post Covid-19 immunization for 15 minutes without incident. He was provided with Vaccine Information Sheet and instruction to access the V-Safe system.   [name] was instructed to call 911 with any severe reactions post vaccine: Marland Kitchen Difficulty breathing  . Swelling of face and throat  . A fast heartbeat  . A bad rash all over body  . Dizziness and weakness   Immunizations Administered    Name Date Dose VIS Date Route   Pfizer COVID-19 Vaccine 04/28/2019  1:54 PM 0.3 mL 02/06/2019 Intramuscular   Manufacturer: [employer], [employer]   Lot: [id number]   NDC: [address]-2

## 2019-05-18 LAB — HM DIABETES EYE EXAM

## 2019-06-01 ENCOUNTER — Other Ambulatory Visit: Payer: Self-pay

## 2019-06-01 ENCOUNTER — Other Ambulatory Visit: Payer: Federal, State, Local not specified - PPO

## 2019-06-01 DIAGNOSIS — E119 Type 2 diabetes mellitus without complications: Secondary | ICD-10-CM | POA: Diagnosis not present

## 2019-06-01 LAB — BASIC METABOLIC PANEL
BUN: 29 mg/dL — ABNORMAL HIGH (ref 6–23)
CO2: 26 mEq/L (ref 19–32)
Calcium: 8.4 mg/dL (ref 8.4–10.5)
Chloride: 108 mEq/L (ref 96–112)
Creatinine, Ser: 0.8 mg/dL (ref 0.40–1.50)
GFR: 95.04 mL/min (ref >60.00)
Glucose, Bld: 111 mg/dL — ABNORMAL HIGH (ref 70–99)
Potassium: 3.7 mEq/L (ref 3.5–5.1)
Sodium: 141 mEq/L (ref 135–145)

## 2019-06-02 LAB — FRUCTOSAMINE: Fructosamine: 244 umol/L (ref 0–285)

## 2019-06-04 NOTE — Progress Notes (Signed)
Patient ID: Kenneth Lawson, male   DOB: 1945/11/14, 74 y.o.   MRN: [id number]   Reason for Appointment: Diabetes follow-up    History of Present Illness    Diagnosis: date of diagnosis: 1997.   Previous history: He was previously treated with glyburide, Actos and metformin.  Metformin was stopped because he had GI side effects  His blood sugars had been relatively high in the last 3-4 years and A1c had been consistently over 7.5% until his initial consultation He was started on Invokana in 10/13 which had helped modestly. His blood sugars had shown considerable improvement with  adding  Nesina since 2/14 .    He was also on Actos which he had stopped but blood sugars did not appear to worsen. .  The previous HbgA1c levels: 6.4 in 4/14,  7.4 in 12/13, prior range from 7.8-8.3 since 2011   RECENT history:   Oral hypoglycemic drugs: Oseni 25/30, Rybelsus 76m, Amaryl 4-8 mg daily  His blood sugars are overall usually well controlled with A1c under 7   A1c is on the last visit the same at 6.3 Fructosamine is not 244  Current blood sugar patterns, management and problems identified:  He was told to stop his evening Amaryl but he says he will take this when his blood sugars are high up to twice a day  Most of his high readings are related to higher carbohydrate foods especially at breakfast, dinner also  Because of problems with balanitis he was switched from Jardiance to Rybelsus 3 mg and his blood sugars overall are generally fairly well controlled  However despite trying to do regular exercise and walk up to 3 miles a day he will still have some high readings  Fasting readings are generally fairly good  Highest blood sugars appear to be in the afternoon and also some after breakfast when he checks them  No side effects with Rybelsus which he takes in the morning every day as directed  He was supposed to call after his visit in December to let [place] when he was  ready for the next prescription but he continues to take 3 mg  He has lost some weight gradually    Side effects from medications: bloating, gas and GI distress from metformin, even 500 mg, occasional balanitis from Invokana  Monitors blood glucose: About once a day  Glucometer: . Blood Glucose readings from monitor download   PRE-MEAL Fasting Lunch Dinner Bedtime Overall  Glucose range:  75-125      Mean/median:  113     140   POST-MEAL PC Breakfast PC Lunch PC Dinner  Glucose range:  168-226  165-244  77-209  Mean/median:   174    Previous readings:  PRE-MEAL Fasting  midday Dinner Bedtime Overall  Glucose range:  72-92  164-221  85-124    Mean/median:     ?   POST-MEAL PC Breakfast PC Lunch PC Dinner  Glucose range:  250   165  Mean/median:         Dietician visit: Most recent: 1997 at diagnosis.  .   Wt Readings from Last 3 Encounters:  06/05/19 162 lb (73.5 kg)  12/26/18 166 lb (75.3 kg)  02/14/18 173 lb (78.5 kg)    Lab Results  Component Value Date   HGBA1C 6.3 (H) 02/13/2019   HGBA1C 6.5 12/23/2018   HGBA1C 6.3 09/22/2018   Lab Results  Component Value Date   MICROALBUR 1.3 12/23/2018   [id number]  09/22/2018   CREATININE 0.80 06/01/2019    OTHER active problems: See review of systems   Lab on 06/01/2019  Component Date Value Ref Range Status  . Fructosamine 06/01/2019 244  0 - 285 umol/L Final   Comment: Published reference interval for apparently healthy subjects between age 30 and 65 is 35 - 285 umol/L and in a poorly controlled diabetic population is 228 - 563 umol/L with a mean of 396 umol/L.   Marland Kitchen Sodium 06/01/2019 141  135 - 145 mEq/L Final  . Potassium 06/01/2019 3.7  3.5 - 5.1 mEq/L Final  . Chloride 06/01/2019 108  96 - 112 mEq/L Final  . CO2 06/01/2019 26  19 - 32 mEq/L Final  . Glucose, Bld 06/01/2019 111* 70 - 99 mg/dL Final  . BUN 06/01/2019 29* 6 - 23 mg/dL Final  . Creatinine, Ser 06/01/2019 0.80  0.40 - 1.50 mg/dL Final  .  GFR 06/01/2019 95.04  >60.00 mL/min Final  . Calcium 06/01/2019 8.4  8.4 - 10.5 mg/dL Final    Allergies as of 06/05/2019   No Known Allergies     Medication List       Accurate as of June 05, 2019 11:59 PM. If you have any questions, ask your nurse or doctor.        STOP taking these medications   Jardiance 25 MG Tabs tablet Generic drug: empagliflozin Stopped by: Elayne Snare, MD     TAKE these medications   Accu-Chek Guide test strip Generic drug: glucose blood CHECK DAILY   glucose blood test strip Use Accu Chek aviva test strips as instructed to check blood sugar daily.   Accu-Chek Guide w/Device Kit 1 each by Does not apply route daily. Use accu chek guide meter to check blood sugar once daily.   Accu-Chek Softclix Lancets lancets   alfuzosin 10 MG 24 hr tablet Commonly known as: UROXATRAL Take 10 mg by mouth daily.   Alogliptin-Pioglitazone 25-30 MG Tabs TAKE 1 TABLET DAILY   ALPRAZolam 0.5 MG tablet Commonly known as: XANAX Take 0.5 mg by mouth every other day as needed. Take 1/2 tablet by mouth every other day as needed.   atorvastatin 80 MG tablet Commonly known as: LIPITOR TAKE 1 TABLET DAILY   buPROPion 300 MG 24 hr tablet Commonly known as: WELLBUTRIN XL Take 300 mg by mouth daily.   finasteride 5 MG tablet Commonly known as: PROSCAR   glimepiride 4 MG tablet Commonly known as: AMARYL TAKE 1 TABLET (4 MG TOTAL) BY MOUTH 2 (TWO) TIMES DAILY BEFORE A MEAL. What changed:   how much to take  when to take this  additional instructions   hydrocortisone-pramoxine 2.5-1 % rectal cream Commonly known as: ANALPRAM-HC   mometasone 50 MCG/ACT nasal spray Commonly known as: NASONEX USE 2 SPRAYS IN EACH NOSTRIL NASALLY ONCE A DAY AS NEEDED   nystatin cream Commonly known as: MYCOSTATIN APPLY TO AFFECTED AREA TWICE A DAY AS NEEDED   omeprazole 40 MG capsule Commonly known as: PRILOSEC as needed.   Rybelsus 3 MG Tabs Generic drug:  Semaglutide Take 1 tablet by mouth daily.   sildenafil 25 MG tablet Commonly known as: VIAGRA Take 25 mg by mouth daily as needed for erectile dysfunction.       Allergies: No Known Allergies  Past Medical History:  Diagnosis Date  . Hypercholesterolemia     Past Surgical History:  Procedure Laterality Date  . MINOR HEMORRHOIDECTOMY N/A     Family History  Problem Relation  Age of Onset  . Cancer Neg Hx     Social History:  reports that he has quit smoking. His smoking use included cigarettes. He has never used smokeless tobacco. No history on file for alcohol and drug.  Review of Systems:   Eye Exam last: 7/20; no retinopathy noted  HYPERLIPIDEMIA: Followed by PCP Has been taking 80 mg Lipitor and LDL has been below 100   Lab Results  Component Value Date   CHOL 150 09/22/2018   HDL 41.80 09/22/2018   LDLCALC 85 09/22/2018   LDLDIRECT 134.6 11/18/2013   TRIG 119.0 09/22/2018   CHOLHDL 4 09/22/2018     Diabetic foot exam in 04/2017 showed normal monofilament sensation in the toes and plantar surfaces, no skin lesions or ulcers on the feet and normal pedal pulses  He has had chronic back pain    Examination:   BP (!) 130/56 (BP Location: Left Arm, Patient Position: Sitting, Cuff Size: Normal)   Pulse 80   Ht '5\' 8"'  (1.727 m)   Wt 162 lb (73.5 kg)   SpO2 96%   BMI 24.63 kg/m   Body mass index is 24.63 kg/m.     Assesment/Plan:   Diabetes type 2 without significant obesity  See history of present illness for detailed discussion of current diabetes management, blood sugar patterns and problems identified  His A1c was previously 6.3, not done recently as he missed his appointment and fructosamine is 244  He has less symptoms suggestive of balanitis with stopping Jardiance but his blood sugars are somewhat higher with 3 mg of Rybelsus He did not call to increase his prescription to 7 mg and missed his appointment in February  Currently he is  concerned about the cost of Rybelsus and does not want to increase the dose but wait till his 90-day prescription runs out to use the 7 mg Discussed that he may have better satiety and less carbohydrate craving with 7 mg Rybelsus Also he should not take Amaryl only as needed when he has high sugars but take it in the morning at least half tablet when he has eaten more carbohydrates To call if he is having consistently high readings    Patient Instructions  Take 1/2 Glimepride at nite if needed    Elayne Snare 06/08/2019, 8:25 AM

## 2019-06-05 ENCOUNTER — Other Ambulatory Visit: Payer: Self-pay

## 2019-06-05 ENCOUNTER — Encounter: Payer: Self-pay | Admitting: Endocrinology

## 2019-06-05 ENCOUNTER — Ambulatory Visit (INDEPENDENT_AMBULATORY_CARE_PROVIDER_SITE_OTHER): Payer: Federal, State, Local not specified - PPO | Admitting: Endocrinology

## 2019-06-05 VITALS — BP 130/56 | HR 80 | Ht 68.0 in | Wt 162.0 lb

## 2019-06-05 DIAGNOSIS — E1165 Type 2 diabetes mellitus with hyperglycemia: Secondary | ICD-10-CM

## 2019-06-05 NOTE — Patient Instructions (Signed)
Take 1/2 Glimepride at nite if needed

## 2019-06-09 ENCOUNTER — Other Ambulatory Visit: Payer: Self-pay | Admitting: Endocrinology

## 2019-06-22 ENCOUNTER — Other Ambulatory Visit: Payer: Self-pay | Admitting: Endocrinology

## 2019-06-22 NOTE — Telephone Encounter (Signed)
Ok to refill 

## 2019-06-23 ENCOUNTER — Other Ambulatory Visit: Payer: Self-pay

## 2019-06-23 MED ORDER — ACCU-CHEK GUIDE VI STRP: ORAL_STRIP | 1 refills | Status: DC

## 2019-06-23 NOTE — Telephone Encounter (Signed)
May refill 

## 2019-07-04 ENCOUNTER — Other Ambulatory Visit: Payer: Self-pay | Admitting: Endocrinology

## 2019-07-31 ENCOUNTER — Other Ambulatory Visit: Payer: Self-pay

## 2019-07-31 ENCOUNTER — Other Ambulatory Visit (INDEPENDENT_AMBULATORY_CARE_PROVIDER_SITE_OTHER): Payer: Federal, State, Local not specified - PPO

## 2019-07-31 DIAGNOSIS — E1165 Type 2 diabetes mellitus with hyperglycemia: Secondary | ICD-10-CM

## 2019-07-31 LAB — LIPID PANEL
Cholesterol: 159 mg/dL (ref 0–200)
HDL: 53.3 mg/dL (ref >39.00)
LDL Cholesterol: 94 mg/dL (ref 0–99)
NonHDL: 105.38
Total CHOL/HDL Ratio: 3
Triglycerides: 58 mg/dL (ref 0.0–149.0)
VLDL: 11.6 mg/dL (ref 0.0–40.0)

## 2019-07-31 LAB — GLUCOSE, RANDOM: Glucose, Bld: 178 mg/dL — ABNORMAL HIGH (ref 70–99)

## 2019-07-31 LAB — HEMOGLOBIN A1C: Hgb A1c MFr Bld: 5.9 % (ref 4.6–6.5)

## 2019-08-04 ENCOUNTER — Ambulatory Visit: Payer: Federal, State, Local not specified - PPO | Admitting: Endocrinology

## 2019-08-04 ENCOUNTER — Encounter: Payer: Self-pay | Admitting: Endocrinology

## 2019-08-04 VITALS — BP 142/68 | HR 70 | Ht 68.0 in | Wt 164.4 lb

## 2019-08-04 DIAGNOSIS — E7800 Pure hypercholesterolemia, unspecified: Secondary | ICD-10-CM

## 2019-08-04 DIAGNOSIS — E119 Type 2 diabetes mellitus without complications: Secondary | ICD-10-CM | POA: Diagnosis not present

## 2019-08-04 NOTE — Progress Notes (Signed)
Patient ID: Kenneth Lawson, male   DOB: March 25, 1945, 74 y.o.   MRN: [id number]   Reason for Appointment: Diabetes follow-up    History of Present Illness    Diagnosis: date of diagnosis: 1997.   Previous history: He was previously treated with glyburide, Actos and metformin.  Metformin was stopped because he had GI side effects  His blood sugars had been relatively high in the last 3-4 years and A1c had been consistently over 7.5% until his initial consultation He was started on Invokana in 10/13 which had helped modestly. His blood sugars had shown considerable improvement with  adding  Nesina since 2/14 .    He was also on Actos which he had stopped but blood sugars did not appear to worsen. .  The previous HbgA1c levels: 6.4 in 4/14,  7.4 in 12/13, prior range from 7.8-8.3 since 2011   RECENT history:   Oral hypoglycemic drugs: Oseni 25/30, Rybelsus 63m, Amaryl 2-6 mg daily  His blood sugars are overall usually well controlled with A1c under 7   A1c is 5.9, previously 6.3   Current blood sugar patterns, management and problems identified:  He has continued on 3 mg of Rybelsus and this will not increased since he had a 90-day supply and his blood sugars were improving  His A1c has improved compared to Jardiance which he was not wanting to continue because of balanitis  Also he thinks that he is generally watching his portions and carbohydrates somewhat better  He has nausea with Rybelsus but does not think that it changed his satiety level; however he still at times may force himself to eat a meal  He is however taking his Amaryl still based on his blood sugar rather than a steady dose are what he is eating; with recent morning sugars being fairly good he mostly takes half a tablet in the morning  In the evening if his blood sugars are relatively higher he will take a whole tablet of 4 mg  Lowest blood sugar 79  However he still eating cereal in the morning and  blood sugars may be as high as 186 after breakfast  He says he is walking at least 2 miles a day  Dinner recently around 8 pm   Side effects from medications: bloating, gas and GI distress from metformin, even 500 mg, balanitis from Invokana and Jardiance  Monitors blood glucose: About once or twice a day  Glucometer: . Blood Glucose readings from ACCU-CHEK monitor download   PRE-MEAL Fasting Lunch Dinner Bedtime Overall  Glucose range:  81-128   81-197 103-190 71  Mean/median:  103   118  137 133   POST-MEAL PC Breakfast PC Lunch PC Dinner  Glucose range:  105-186  90-222   Mean/median:  161     Previous readings  PRE-MEAL Fasting Lunch Dinner Bedtime Overall  Glucose range:  75-125      Mean/median:  113     140   POST-MEAL PC Breakfast PC Lunch PC Dinner  Glucose range:  168-226  165-244  77-209  Mean/median:   174     Dietician visit: Most recent: 1997 at diagnosis.  .   Wt Readings from Last 3 Encounters:  08/04/19 164 lb 6.4 oz (74.6 kg)  06/05/19 162 lb (73.5 kg)  12/26/18 166 lb (75.3 kg)    Lab Results  Component Value Date   HGBA1C 5.9 07/31/2019   HGBA1C 6.3 (H) 02/13/2019   HGBA1C 6.5 12/23/2018  Lab Results  Component Value Date   MICROALBUR 1.3 12/23/2018   LDLCALC 94 07/31/2019   CREATININE 0.80 06/01/2019    OTHER active problems: See review of systems   Lab on 07/31/2019  Component Date Value Ref Range Status  . Glucose, Bld 07/31/2019 178* 70 - 99 mg/dL Final  . Cholesterol 07/31/2019 159  0 - 200 mg/dL Final   ATP III Classification       Desirable:  < 200 mg/dL               Borderline High:  200 - 239 mg/dL          High:  > = 240 mg/dL  . Triglycerides 07/31/2019 58.0  0.0 - 149.0 mg/dL Final   Normal:  <150 mg/dLBorderline High:  150 - 199 mg/dL  . HDL 07/31/2019 53.30  >39.00 mg/dL Final  . VLDL 07/31/2019 11.6  0.0 - 40.0 mg/dL Final  . LDL Cholesterol 07/31/2019 94  0 - 99 mg/dL Final  . Total CHOL/HDL Ratio 07/31/2019 3    Final                  Men          Women1/2 Average Risk     3.4          3.3Average Risk          5.0          4.42X Average Risk          9.6          7.13X Average Risk          15.0          11.0                      . NonHDL 07/31/2019 105.38   Final   NOTE:  Non-HDL goal should be 30 mg/dL higher than patient's LDL goal (i.e. LDL goal of < 70 mg/dL, would have non-HDL goal of < 100 mg/dL)  . Hgb A1c MFr Bld 07/31/2019 5.9  4.6 - 6.5 % Final   Glycemic Control Guidelines for People with Diabetes:Non Diabetic:  <6%Goal of Therapy: <7%Additional Action Suggested:  >8%     Allergies as of 08/04/2019   No Known Allergies     Medication List       Accurate as of August 04, 2019  1:28 PM. If you have any questions, ask your nurse or doctor.        STOP taking these medications   buPROPion 300 MG 24 hr tablet Commonly known as: WELLBUTRIN XL Stopped by: Elayne Snare, MD     TAKE these medications   Accu-Chek Guide test strip Generic drug: glucose blood CHECK DAILY   Accu-Chek Guide test strip Generic drug: glucose blood Use Accu Chek Guide test strips as instructed to check blood sugar once daily.   Accu-Chek Guide w/Device Kit 1 each by Does not apply route daily. Use accu chek guide meter to check blood sugar once daily.   Accu-Chek Softclix Lancets lancets   alfuzosin 10 MG 24 hr tablet Commonly known as: UROXATRAL Take 10 mg by mouth daily.   Alogliptin-Pioglitazone 25-30 MG Tabs TAKE 1 TABLET DAILY   ALPRAZolam 0.5 MG tablet Commonly known as: XANAX Take 0.5 mg by mouth every other day as needed. Take 1/2 tablet by mouth every other day as needed.   atorvastatin 80 MG tablet Commonly known as: LIPITOR TAKE  1 TABLET DAILY   finasteride 5 MG tablet Commonly known as: PROSCAR   glimepiride 4 MG tablet Commonly known as: AMARYL TAKE 1 TABLET BY MOUTH TWICE A DAY BEFORE A MEAL   hydrocortisone-pramoxine 2.5-1 % rectal cream Commonly known as: ANALPRAM-HC     mometasone 50 MCG/ACT nasal spray Commonly known as: NASONEX USE 2 SPRAYS IN EACH NOSTRIL NASALLY ONCE A DAY AS NEEDED   nystatin cream Commonly known as: MYCOSTATIN APPLY TO AFFECTED AREA TWICE A DAY AS NEEDED   omeprazole 40 MG capsule Commonly known as: PRILOSEC as needed.   Rybelsus 3 MG Tabs Generic drug: Semaglutide Take 1 tablet by mouth daily.   sildenafil 25 MG tablet Commonly known as: VIAGRA Take 25 mg by mouth daily as needed for erectile dysfunction.       Allergies: No Known Allergies  Past Medical History:  Diagnosis Date  . Hypercholesterolemia     Past Surgical History:  Procedure Laterality Date  . MINOR HEMORRHOIDECTOMY N/A     Family History  Problem Relation Age of Onset  . Cancer Neg Hx     Social History:  reports that he has quit smoking. His smoking use included cigarettes. He has never used smokeless tobacco. No history on file for alcohol and drug.  Review of Systems:   Eye Exam last: 7/20; no retinopathy noted  HYPERLIPIDEMIA: Followed by PCP Has been taking 80 mg Lipitor and LDL has been below 100   Lab Results  Component Value Date   CHOL 159 07/31/2019   HDL 53.30 07/31/2019   LDLCALC 94 07/31/2019   LDLDIRECT 134.6 11/18/2013   TRIG 58.0 07/31/2019   CHOLHDL 3 07/31/2019     Diabetic foot exam in 6/21 showed normal monofilament sensation in the toes and plantar surfaces, no skin lesions or ulcers on the feet and normal pedal pulses  He is not recently having any problem with his sciatica  No history of hypertension  BP Readings from Last 3 Encounters:  08/04/19 (!) 142/68  06/05/19 (!) 130/56  12/26/18 140/70      Examination:   BP (!) 142/68 (BP Location: Left Arm, Patient Position: Sitting, Cuff Size: Normal)   Pulse 70   Ht '5\' 8"'  (1.727 m)   Wt 164 lb 6.4 oz (74.6 kg)   SpO2 98%   BMI 25.00 kg/m   Body mass index is 25 kg/m.    Diabetic Foot Exam - Simple   Simple Foot Form Diabetic Foot exam  was performed with the following findings: Yes 08/04/2019  1:28 PM  Visual Inspection No deformities, no ulcerations, no other skin breakdown bilaterally: Yes Sensation Testing Intact to touch and monofilament testing bilaterally: Yes Pulse Check Posterior Tibialis and Dorsalis pulse intact bilaterally: Yes Comments    No pedal edema  Assesment/Plan:   Diabetes type 2 without significant obesity  See history of present illness for detailed discussion of current diabetes management, blood sugar patterns and problems identified  His A1c was previously 6.3, now 5.9  He has benefited significantly from using Rybelsus instead of Jardiance Also with 3 mg of blood sugars are generally well controlled although occasionally may be high postprandially based on his diet He now thinks that he can afford the Rybelsus and is tolerating this well His monitoring has increased to about twice a day and he is doing more readings after supper  Discussed that he should take a steady dose of Amaryl before meals and increase the dose in the morning if  he is planning to eat cereal and also take 4 mg at dinnertime if he is planning to eat larger carbohydrate meals or eating out otherwise take only 2 mg Stay on 3 mg Rybelsus in the morning  No evidence of neuropathy or vascular disease on exam today  LIPIDS: Well-controlled and will discuss that since he has been taking the Lipitor long-term that has periodic joint or musculoskeletal pains are unlikely to be related to this  High normal systolic blood pressure: Continue to monitor  There are no Patient Instructions on file for this visit.   Elayne Snare 08/04/2019, 1:28 PM

## 2019-08-13 ENCOUNTER — Other Ambulatory Visit: Payer: Self-pay | Admitting: Endocrinology

## 2019-08-14 NOTE — Telephone Encounter (Signed)
Rx refilled as ordered

## 2019-08-14 NOTE — Telephone Encounter (Signed)
Okay to refill? 

## 2019-08-14 NOTE — Telephone Encounter (Signed)
Please advise if you would like to refill this request

## 2019-08-18 ENCOUNTER — Other Ambulatory Visit: Payer: Self-pay | Admitting: Endocrinology

## 2019-09-24 ENCOUNTER — Other Ambulatory Visit: Payer: Self-pay | Admitting: Endocrinology

## 2019-09-24 NOTE — Telephone Encounter (Signed)
Do you wish to refill NYSTATIN 100,000 UNIT/GM CREAM. ? Please advise.

## 2019-09-24 NOTE — Telephone Encounter (Signed)
Please ask him to get this now from his PCP since he is not on Jardiance or Invokana from me.  Also we had sent a prescription last month

## 2019-09-24 NOTE — Telephone Encounter (Signed)
Spoke with patient regarding RX request. Patient will need to contact PCP for a refill. Patient verbalized understanding and did not have any questions.

## 2019-09-25 ENCOUNTER — Other Ambulatory Visit: Payer: Self-pay

## 2019-09-25 ENCOUNTER — Ambulatory Visit: Payer: Medicare Other | Admitting: Podiatry

## 2019-09-25 ENCOUNTER — Encounter: Payer: Self-pay | Admitting: Podiatry

## 2019-09-25 ENCOUNTER — Ambulatory Visit (INDEPENDENT_AMBULATORY_CARE_PROVIDER_SITE_OTHER): Payer: Federal, State, Local not specified - PPO

## 2019-09-25 DIAGNOSIS — M779 Enthesopathy, unspecified: Secondary | ICD-10-CM

## 2019-09-25 DIAGNOSIS — M7752 Other enthesopathy of left foot: Secondary | ICD-10-CM

## 2019-09-25 DIAGNOSIS — M722 Plantar fascial fibromatosis: Secondary | ICD-10-CM

## 2019-10-07 ENCOUNTER — Other Ambulatory Visit: Payer: Self-pay | Admitting: *Deleted

## 2019-10-07 MED ORDER — ACCU-CHEK GUIDE VI STRP: ORAL_STRIP | 2 refills | Status: DC

## 2019-10-27 NOTE — Progress Notes (Signed)
Subjective:   Patient ID: Kenneth Lawson, male   DOB: 74 y.o.   MRN: [id number]   HPI Patient presents stating is had a lot of pain in his left foot and heel and he was concerned about it and states it is been recent over the last month.  He does not smoke and would like to be more active   Review of Systems  All other systems reviewed and are negative.       Objective:  Physical Exam Vitals and nursing note reviewed.  Constitutional:      Appearance: He is well-developed.  Pulmonary:     Effort: Pulmonary effort is normal.  Musculoskeletal:        General: Normal range of motion.  Skin:    General: Skin is warm.  Neurological:     Mental Status: He is alert.     Neurovascular status intact muscle strength found to be adequate range of motion within normal limits.  Patient is noted to have discomfort plantar aspect left heel with inflammation fluid buildup around the medial band and pain with palpation.  Patient has good digit perfusion well oriented x3     Assessment:  Acute plantar fasciitis left with inflammation fluid buildup     Plan:  H NP reviewed condition x-ray reviewed today I did sterile prep injected the plantar fashion 3 mg Kenalog 5 mg Xylocaine applied brace to take pressure off instructed on physical therapy and utilization of shoe gear modification.  Reappoint to recheck  X-ray indicates spur no indications of stress fracture or advanced arthritis with this condition

## 2019-10-30 ENCOUNTER — Other Ambulatory Visit: Payer: Self-pay

## 2019-10-30 ENCOUNTER — Other Ambulatory Visit (INDEPENDENT_AMBULATORY_CARE_PROVIDER_SITE_OTHER): Payer: Federal, State, Local not specified - PPO

## 2019-10-30 DIAGNOSIS — E119 Type 2 diabetes mellitus without complications: Secondary | ICD-10-CM | POA: Diagnosis not present

## 2019-10-30 LAB — MICROALBUMIN / CREATININE URINE RATIO
Creatinine,U: 267.5 mg/dL
Microalb Creat Ratio: 1 mg/g (ref 0.0–30.0)
Microalb, Ur: 2.6 mg/dL — ABNORMAL HIGH (ref 0.0–1.9)

## 2019-10-30 LAB — BASIC METABOLIC PANEL
BUN: 29 mg/dL — ABNORMAL HIGH (ref 6–23)
CO2: 26 mEq/L (ref 19–32)
Calcium: 8.7 mg/dL (ref 8.4–10.5)
Chloride: 107 mEq/L (ref 96–112)
Creatinine, Ser: 0.86 mg/dL (ref 0.40–1.50)
GFR: 87.33 mL/min (ref >60.00)
Glucose, Bld: 172 mg/dL — ABNORMAL HIGH (ref 70–99)
Potassium: 4.1 mEq/L (ref 3.5–5.1)
Sodium: 140 mEq/L (ref 135–145)

## 2019-10-30 LAB — HEMOGLOBIN A1C: Hgb A1c MFr Bld: 6 % (ref 4.6–6.5)

## 2019-11-05 ENCOUNTER — Ambulatory Visit: Payer: Federal, State, Local not specified - PPO | Admitting: Endocrinology

## 2019-11-05 ENCOUNTER — Encounter: Payer: Self-pay | Admitting: Endocrinology

## 2019-11-05 ENCOUNTER — Other Ambulatory Visit: Payer: Self-pay

## 2019-11-05 VITALS — BP 110/58 | HR 91 | Ht 68.0 in | Wt 158.8 lb

## 2019-11-05 DIAGNOSIS — E119 Type 2 diabetes mellitus without complications: Secondary | ICD-10-CM | POA: Diagnosis not present

## 2019-11-05 DIAGNOSIS — E7800 Pure hypercholesterolemia, unspecified: Secondary | ICD-10-CM

## 2019-11-05 DIAGNOSIS — R634 Abnormal weight loss: Secondary | ICD-10-CM

## 2019-11-05 LAB — POCT GLUCOSE (DEVICE FOR HOME USE): POC Glucose: 190 mg/dl — AB (ref 70–99)

## 2019-11-05 NOTE — Progress Notes (Signed)
Patient ID: Kenneth Lawson, male   DOB: 12/12/1945, 74 y.o.   MRN: [id number]   Reason for Appointment: Diabetes follow-up    History of Present Illness    Diagnosis: date of diagnosis: 1997.   Previous history: He was previously treated with glyburide, Actos and metformin.  Metformin was stopped because he had GI side effects  His blood sugars had been relatively high in the last 3-4 years and A1c had been consistently over 7.5% until his initial consultation He was started on Invokana in 10/13 which had helped modestly. His blood sugars had shown considerable improvement with  adding  Nesina since 2/14 .    He was also on Actos which he had stopped but blood sugars did not appear to worsen. .  The previous HbgA1c levels: 6.4 in 4/14,  7.4 in 12/13, prior range from 7.8-8.3 since 2011   RECENT history:   Oral hypoglycemic drugs: Oseni 25/30, Rybelsus 63m, Amaryl 2-6 mg daily  His blood sugars are overall usually well controlled with A1c under 7   A1c is stable at 6%   Current blood sugar patterns, management and problems identified:  He has lost some weight but he thinks that his weight loss is more related to decreased appetite from anxiety rather than Rybelsus  He has nausea but he does not think this is from Rybelsus  As before he is sometimes having high readings after meals including today when he gets more carbohydrate  Some of his snacks may be related to anxiety also  His meal portions are relatively good  He thinks he is checking his sugars 3 times a day but did not bring his monitor  He feels that his sugars are around 150 at night after eating and only occasionally above 180 target, highest 205 in the last 3 months  Usually fasting readings are fairly good  He says he is walking at least 2 miles a day and also doing some outdoor exercises  Dinner recently around 8 pm   Side effects from medications: bloating, gas and GI distress from metformin,  even 500 mg, balanitis from Invokana and Jardiance  Monitors blood glucose: About 3x  Glucometer: . Blood Glucose readings as above  Previous data from download  PRE-MEAL Fasting Lunch Dinner Bedtime Overall  Glucose range:  81-128   81-197 103-190   Mean/median:  103   118  137 133   POST-MEAL PC Breakfast PC Lunch PC Dinner  Glucose range:  105-186  90-222   Mean/median:  161      Dietician visit: Most recent: 1997 at diagnosis.  .   Wt Readings from Last 3 Encounters:  11/05/19 158 lb 12.8 oz (72 kg)  08/04/19 164 lb 6.4 oz (74.6 kg)  06/05/19 162 lb (73.5 kg)    Lab Results  Component Value Date   HGBA1C 6.0 10/30/2019   HGBA1C 5.9 07/31/2019   HGBA1C 6.3 (H) 02/13/2019   Lab Results  Component Value Date   MICROALBUR 2.6 (H) 10/30/2019   LDLCALC 94 07/31/2019   CREATININE 0.86 10/30/2019    OTHER active problems: See review of systems   Office Visit on 11/05/2019  Component Date Value Ref Range Status  . POC Glucose 11/05/2019 190* 70 - 99 mg/dl Final  . HM Diabetic Eye Exam 05/18/2019 No Retinopathy  No Retinopathy Final  Lab on 10/30/2019  Component Date Value Ref Range Status  . Microalb, Ur 10/30/2019 2.6* 0.0 - 1.9 mg/dL Final  . Creatinine,U  10/30/2019 267.5  mg/dL Final  . Microalb Creat Ratio 10/30/2019 1.0  0.0 - 30.0 mg/g Final  . Sodium 10/30/2019 140  135 - 145 mEq/L Final  . Potassium 10/30/2019 4.1  3.5 - 5.1 mEq/L Final  . Chloride 10/30/2019 107  96 - 112 mEq/L Final  . CO2 10/30/2019 26  19 - 32 mEq/L Final  . Glucose, Bld 10/30/2019 172* 70 - 99 mg/dL Final  . BUN 10/30/2019 29* 6 - 23 mg/dL Final  . Creatinine, Ser 10/30/2019 0.86  0.40 - 1.50 mg/dL Final  . GFR 10/30/2019 87.33  >60.00 mL/min Final  . Calcium 10/30/2019 8.7  8.4 - 10.5 mg/dL Final  . Hgb A1c MFr Bld 10/30/2019 6.0  4.6 - 6.5 % Final   Glycemic Control Guidelines for People with Diabetes:Non Diabetic:  <6%Goal of Therapy: <7%Additional Action Suggested:  >8%      Allergies as of 11/05/2019   No Known Allergies     Medication List       Accurate as of November 05, 2019  3:05 PM. If you have any questions, ask your nurse or doctor.        Accu-Chek Guide test strip Generic drug: glucose blood Use Accu Chek Guide test strips as instructed to check blood sugar once daily.   Accu-Chek Guide test strip Generic drug: glucose blood Use to check blood sugars three times daily.   Accu-Chek Guide w/Device Kit 1 each by Does not apply route daily. Use accu chek guide meter to check blood sugar once daily.   Accu-Chek Softclix Lancets lancets   alfuzosin 10 MG 24 hr tablet Commonly known as: UROXATRAL Take 10 mg by mouth daily.   Alogliptin-Pioglitazone 25-30 MG Tabs TAKE 1 TABLET DAILY   ALPRAZolam 0.5 MG tablet Commonly known as: XANAX Take 0.5 mg by mouth every other day as needed. Take 1/2 tablet by mouth every other day as needed.   atorvastatin 80 MG tablet Commonly known as: LIPITOR TAKE 1 TABLET DAILY   finasteride 5 MG tablet Commonly known as: PROSCAR   glimepiride 4 MG tablet Commonly known as: AMARYL TAKE 1 TABLET BY MOUTH TWICE A DAY BEFORE A MEAL   hydrocortisone-pramoxine 2.5-1 % rectal cream Commonly known as: ANALPRAM-HC   mometasone 50 MCG/ACT nasal spray Commonly known as: NASONEX USE 2 SPRAYS IN EACH NOSTRIL NASALLY ONCE A DAY AS NEEDED   nystatin cream Commonly known as: MYCOSTATIN APPLY TO AFFECTED AREA TWICE A DAY AS NEEDED   omeprazole 40 MG capsule Commonly known as: PRILOSEC as needed.   Rybelsus 3 MG Tabs Generic drug: Semaglutide TAKE 1 TABLET DAILY   sildenafil 25 MG tablet Commonly known as: VIAGRA Take 25 mg by mouth daily as needed for erectile dysfunction.   tadalafil 5 MG tablet Commonly known as: CIALIS Take 5 mg by mouth daily as needed for erectile dysfunction.       Allergies: No Known Allergies  Past Medical History:  Diagnosis Date  . Hypercholesterolemia      Past Surgical History:  Procedure Laterality Date  . MINOR HEMORRHOIDECTOMY N/A     Family History  Problem Relation Age of Onset  . Cancer Neg Hx     Social History:  reports that he has quit smoking. His smoking use included cigarettes. He has never used smokeless tobacco. No history on file for alcohol use and drug use.  Review of Systems:   Eye Exam last: 7/20; no retinopathy noted  HYPERLIPIDEMIA: Followed by PCP Has been  taking 80 mg Lipitor and LDL has been below 100  He has had some aches and pains in his arms and legs and is concerned about side effects from Lipitor  Lab Results  Component Value Date   CHOL 159 07/31/2019   HDL 53.30 07/31/2019   LDLCALC 94 07/31/2019   LDLDIRECT 134.6 11/18/2013   TRIG 58.0 07/31/2019   CHOLHDL 3 07/31/2019     Diabetic foot exam in 6/21 showed normal monofilament sensation in the toes and plantar surfaces, no skin lesions or ulcers on the feet and normal pedal pulses  No history of hypertension or microalbuminuria  BP Readings from Last 3 Encounters:  11/05/19 (!) 110/58  08/04/19 (!) 142/68  06/05/19 (!) 130/56   His annual eye exam was in 3/21 and was normal   Examination:   BP (!) 110/58 (BP Location: Left Arm, Patient Position: Sitting, Cuff Size: Normal)   Pulse 91   Ht '5\' 8"'  (1.727 m)   Wt 158 lb 12.8 oz (72 kg)   SpO2 92%   BMI 24.15 kg/m   Body mass index is 24.15 kg/m.      Assesment/Plan:   Diabetes type 2 without significant obesity  See history of present illness for detailed discussion of current diabetes management, blood sugar patterns and problems identified  His A1c is again excellent at 6%  Although he has not brought his meter for download and his blood sugars are appearing high today and in the lab his blood sugars are probably mostly high when he is getting more carbohydrate at breakfast Weight has gone down However he thinks his decreased appetite and nausea are related to  anxiety and not Rybelsus  For now we will continue the same regimen His wife is present today and discussed principles of diet which she appears to understand well with cutting back on carbohydrates and making sure he has protein at every meal such as eggs or low-fat cheese  Weight loss and mild sinus tachycardia: We will check TSH on the next visit However he thinks he would benefit from seeing a psychologist for his anxiety and depression Also may benefit from another antidepressant and he will discuss with PCP  LIPID: He can try taking half tablet of Lipitor for couple of weeks to see if his muscle aches are better and if it does make a difference he can switch to 20 mg of Crestor   Patient Instructions  Try 1/2 Lipitor daily for 2 weeks  Check blood sugars on waking up 2-3 days a week  Also check blood sugars about 2 hours after meals and do this after different meals by rotation  Recommended blood sugar levels on waking up are 90-130 and about 2 hours after meal is 130-160  Please bring your blood sugar monitor to each visit, thank you      Elayne Snare 11/05/2019, 3:05 PM

## 2019-11-05 NOTE — Patient Instructions (Addendum)
Try 1/2 Lipitor daily for 2 weeks  Check blood sugars on waking up 2-3 days a week  Also check blood sugars about 2 hours after meals and do this after different meals by rotation  Recommended blood sugar levels on waking up are 90-130 and about 2 hours after meal is 130-160  Please bring your blood sugar monitor to each visit, thank you

## 2019-11-11 MED ORDER — ACCU-CHEK SOFTCLIX LANCETS MISC: 3 refills | Status: DC

## 2019-12-15 ENCOUNTER — Other Ambulatory Visit: Payer: Self-pay | Admitting: Endocrinology

## 2020-01-08 ENCOUNTER — Telehealth: Payer: Self-pay | Admitting: Endocrinology

## 2020-01-08 NOTE — Telephone Encounter (Signed)
Patient has been having high sugars the past couple weeks and normally its after breakfast. He did start taking antidepressant and they weren't sure if this is in relation to his sugars being high. Please advise. [phone] (wife)

## 2020-01-09 ENCOUNTER — Ambulatory Visit: Payer: Federal, State, Local not specified - PPO | Attending: Internal Medicine

## 2020-01-09 DIAGNOSIS — Z23 Encounter for immunization: Secondary | ICD-10-CM

## 2020-01-09 NOTE — Progress Notes (Signed)
   Covid-19 Vaccination [facility]  Name:  Kenneth Lawson    MRN: [id number] DOB: [birth year]  01/09/2020  [name] was observed post Covid-19 immunization for 15 minutes without incident. He was provided with Vaccine Information Sheet and instruction to access the V-Safe system.   [name] was instructed to call 911 with any severe reactions post vaccine: Marland Kitchen Difficulty breathing  . Swelling of face and throat  . A fast heartbeat  . A bad rash all over body  . Dizziness and weakness   Immunizations Administered    Name Date Dose VIS Date Route   Pfizer COVID-19 Vaccine 01/09/2020 11:11 AM 0.3 mL 12/16/2019 Intramuscular   Manufacturer: [employer], [employer]   Lot: [id number]   NDC: [address]-5

## 2020-01-11 NOTE — Telephone Encounter (Signed)
I spoke with the patient, he said he's recently started cymbalta and his sugars had been going up after breakfast, but it does seem to be getting better now.  Patient wants to watch his sugars for a few more days and will call back if not any better.

## 2020-01-15 ENCOUNTER — Telehealth: Payer: Self-pay | Admitting: Endocrinology

## 2020-01-15 NOTE — Telephone Encounter (Signed)
Patient called and has been experiencing higher BS earlier in the day and lows later in the evening.  He recently started new antidepressant, changed his dietary intake, etc.  Needs assistance/advice on leveling out blood sugars.  Call back number is [phone]

## 2020-01-17 ENCOUNTER — Other Ambulatory Visit: Payer: Self-pay | Admitting: Endocrinology

## 2020-01-18 NOTE — Telephone Encounter (Signed)
He should not take any glimepiride in the evening and try to take 1 tablet in the morning regardless of blood sugar.  Also to make sure he gets some protein with breakfast daily

## 2020-01-18 NOTE — Telephone Encounter (Signed)
Noted, patient is aware. 

## 2020-01-18 NOTE — Telephone Encounter (Signed)
I spoke with Mr. Kenneth Lawson and his wife, his sugars are as follows.  11/19  8:03 am- 86 10:10 am- 116 1:07 pm-194 2:31 pm- 184\  11/21 no time given- 73 and 77  11/20 his sugar was 203 after breakfast, he took his Rybelsus and 1/2 of an amaryl.  He states that he is exercising more and trying to eat better, but that his sugars seem to drop low in the morning and then go up later in the day, they are not sure if it's because of his exercise, eating better or the new antidepressant he is taking.

## 2020-02-01 ENCOUNTER — Other Ambulatory Visit: Payer: Self-pay

## 2020-02-01 ENCOUNTER — Other Ambulatory Visit (INDEPENDENT_AMBULATORY_CARE_PROVIDER_SITE_OTHER): Payer: Federal, State, Local not specified - PPO

## 2020-02-01 DIAGNOSIS — E7800 Pure hypercholesterolemia, unspecified: Secondary | ICD-10-CM

## 2020-02-01 DIAGNOSIS — E119 Type 2 diabetes mellitus without complications: Secondary | ICD-10-CM | POA: Diagnosis not present

## 2020-02-01 DIAGNOSIS — R634 Abnormal weight loss: Secondary | ICD-10-CM | POA: Diagnosis not present

## 2020-02-01 LAB — LIPID PANEL
Cholesterol: 185 mg/dL (ref 0–200)
HDL: 58.3 mg/dL (ref >39.00)
LDL Cholesterol: 119 mg/dL — ABNORMAL HIGH (ref 0–99)
NonHDL: 127.05
Total CHOL/HDL Ratio: 3
Triglycerides: 42 mg/dL (ref 0.0–149.0)
VLDL: 8.4 mg/dL (ref 0.0–40.0)

## 2020-02-01 LAB — COMPREHENSIVE METABOLIC PANEL
ALT: 15 U/L (ref 0–53)
AST: 14 U/L (ref 0–37)
Albumin: 3.8 g/dL (ref 3.5–5.2)
Alkaline Phosphatase: 55 U/L (ref 39–117)
BUN: 31 mg/dL — ABNORMAL HIGH (ref 6–23)
CO2: 24 mEq/L (ref 19–32)
Calcium: 8.9 mg/dL (ref 8.4–10.5)
Chloride: 109 mEq/L (ref 96–112)
Creatinine, Ser: 0.92 mg/dL (ref 0.40–1.50)
GFR: 83.04 mL/min (ref >60.00)
Glucose, Bld: 104 mg/dL — ABNORMAL HIGH (ref 70–99)
Potassium: 3.8 mEq/L (ref 3.5–5.1)
Sodium: 140 mEq/L (ref 135–145)
Total Bilirubin: 0.5 mg/dL (ref 0.2–1.2)
Total Protein: 6.3 g/dL (ref 6.0–8.3)

## 2020-02-01 LAB — HEMOGLOBIN A1C: Hgb A1c MFr Bld: 5.7 % (ref 4.6–6.5)

## 2020-02-01 LAB — TSH: TSH: 2.97 u[IU]/mL (ref 0.35–4.50)

## 2020-02-04 ENCOUNTER — Encounter: Payer: Self-pay | Admitting: Endocrinology

## 2020-02-04 ENCOUNTER — Ambulatory Visit (INDEPENDENT_AMBULATORY_CARE_PROVIDER_SITE_OTHER): Payer: Federal, State, Local not specified - PPO | Admitting: Endocrinology

## 2020-02-04 ENCOUNTER — Other Ambulatory Visit: Payer: Self-pay

## 2020-02-04 VITALS — BP 142/80 | HR 78 | Resp 18 | Ht 67.0 in | Wt 152.4 lb

## 2020-02-04 DIAGNOSIS — E119 Type 2 diabetes mellitus without complications: Secondary | ICD-10-CM

## 2020-02-04 DIAGNOSIS — E7800 Pure hypercholesterolemia, unspecified: Secondary | ICD-10-CM

## 2020-02-04 DIAGNOSIS — R634 Abnormal weight loss: Secondary | ICD-10-CM

## 2020-02-04 NOTE — Patient Instructions (Addendum)
Stop Rybelsus and call if sugars much higher  Check blood sugars on waking up 3-4 days a week  Also check blood sugars about 2 hours after meals and do this after different meals by rotation  Recommended blood sugar levels on waking up are 90-130 and about 2 hours after meal is 130-180  Please bring your blood sugar monitor to each visit, thank you

## 2020-02-04 NOTE — Progress Notes (Signed)
Patient ID: Kenneth Lawson, male   DOB: [birth year], 74 y.o.   MRN: [id number]   Reason for Appointment: Diabetes follow-up    History of Present Illness    Diagnosis: date of diagnosis: 1997.   Previous history: He was previously treated with glyburide, Actos and metformin.  Metformin was stopped because he had GI side effects  His blood sugars had been relatively high in the last 3-4 years and A1c had been consistently over 7.5% until his initial consultation He was started on Invokana in 10/13 which had helped modestly. His blood sugars had shown considerable improvement with  adding  Nesina since 2/14 .    He was also on Actos which he had stopped but blood sugars did not appear to worsen. .  The previous HbgA1c levels: 6.4 in 4/14,  7.4 in 12/13, prior range from 7.8-8.3 since 2011   RECENT history:   Oral hypoglycemic drugs: Oseni 25/30, Rybelsus 6m, Amaryl 4 mg daily  His blood sugars are overall usually well controlled with A1c under 7   A1c is better at 5.7, previously was at 6%   Current blood sugar patterns, management and problems identified:  He has lost more weight  However his blood sugars are well appearing to be getting progressively lower also  His Amaryl was reduced to only 1 tablet in the morning and evening dose stop because of tendency to overnight hypoglycemia last month  He started losing weight from about June and had been on Rybelsus since about January  His blood sugars are averaging low normal overnight, periodically higher in the afternoon but near normal in the evenings with tendency to low sugars  HYPOGLYCEMIA has been occasional and recently has mild hypoglycemia in the afternoon  Lowest blood sugar 49 before dinnertime on 11/29  He is checking his sugars about 5 times a day  Takes Rybelsus before breakfast as directed and no nausea with this  He says he is walking at the most 2 miles a day, previously somewhat more but now he  is having somewhat more weakness  Dinner recently around 8 pm   Side effects from medications: bloating, gas and GI distress from metformin, even 500 mg, balanitis from Invokana and Jardiance  Monitors blood glucose: About 3x  Glucometer: . Blood Glucose readings    PRE-MEAL Fasting Lunch Dinner Bedtime Overall  Glucose range:  73-112    101-159 49-245  Mean/median:  90  119  114  120 127   POST-MEAL PC Breakfast PC Lunch PC Dinner  Glucose range:     Mean/median:   175  112   Prior blood sugars:   PRE-MEAL Fasting Lunch Dinner Bedtime Overall  Glucose range:  81-128   81-197 103-190   Mean/median:  103   118  137 133   POST-MEAL PC Breakfast PC Lunch PC Dinner  Glucose range:  105-186  90-222   Mean/median:  161      Dietician visit: Most recent: 1997 at diagnosis.  .   Wt Readings from Last 3 Encounters:  02/04/20 152 lb 6 oz (69.1 kg)  11/05/19 158 lb 12.8 oz (72 kg)  08/04/19 164 lb 6.4 oz (74.6 kg)    Lab Results  Component Value Date   HGBA1C 5.7 02/01/2020   HGBA1C 6.0 10/30/2019   HGBA1C 5.9 07/31/2019   Lab Results  Component Value Date   MICROALBUR 2.6 (H) 10/30/2019   LDLCALC 119 (H) 02/01/2020   CREATININE 0.92 02/01/2020  OTHER active problems: See review of systems   Lab on 02/01/2020  Component Date Value Ref Range Status  . TSH 02/01/2020 2.97  0.35 - 4.50 uIU/mL Final  . Cholesterol 02/01/2020 185  0 - 200 mg/dL Final   ATP III Classification       Desirable:  < 200 mg/dL               Borderline High:  200 - 239 mg/dL          High:  > = 240 mg/dL  . Triglycerides 02/01/2020 42.0  0.0 - 149.0 mg/dL Final   Normal:  <150 mg/dLBorderline High:  150 - 199 mg/dL  . HDL 02/01/2020 58.30  >39.00 mg/dL Final  . VLDL 02/01/2020 8.4  0.0 - 40.0 mg/dL Final  . LDL Cholesterol 02/01/2020 119* 0 - 99 mg/dL Final  . Total CHOL/HDL Ratio 02/01/2020 3   Final                  Men          Women1/2 Average Risk     3.4          3.3Average Risk           5.0          4.42X Average Risk          9.6          7.13X Average Risk          15.0          11.0                      . NonHDL 02/01/2020 127.05   Final   NOTE:  Non-HDL goal should be 30 mg/dL higher than patient's LDL goal (i.e. LDL goal of < 70 mg/dL, would have non-HDL goal of < 100 mg/dL)  . Sodium 02/01/2020 140  135 - 145 mEq/L Final  . Potassium 02/01/2020 3.8  3.5 - 5.1 mEq/L Final  . Chloride 02/01/2020 109  96 - 112 mEq/L Final  . CO2 02/01/2020 24  19 - 32 mEq/L Final  . Glucose, Bld 02/01/2020 104* 70 - 99 mg/dL Final  . BUN 02/01/2020 31* 6 - 23 mg/dL Final  . Creatinine, Ser 02/01/2020 0.92  0.40 - 1.50 mg/dL Final  . Total Bilirubin 02/01/2020 0.5  0.2 - 1.2 mg/dL Final  . Alkaline Phosphatase 02/01/2020 55  39 - 117 U/L Final  . AST 02/01/2020 14  0 - 37 U/L Final  . ALT 02/01/2020 15  0 - 53 U/L Final  . Total Protein 02/01/2020 6.3  6.0 - 8.3 g/dL Final  . Albumin 02/01/2020 3.8  3.5 - 5.2 g/dL Final  . GFR 02/01/2020 83.04  >60.00 mL/min Final   Calculated using the CKD-EPI Creatinine Equation (2021)  . Calcium 02/01/2020 8.9  8.4 - 10.5 mg/dL Final  . Hgb A1c MFr Bld 02/01/2020 5.7  4.6 - 6.5 % Final   Glycemic Control Guidelines for People with Diabetes:Non Diabetic:  <6%Goal of Therapy: <7%Additional Action Suggested:  >8%     Allergies as of 02/04/2020   No Known Allergies     Medication List       Accurate as of February 04, 2020  1:24 PM. If you have any questions, ask your nurse or doctor.        Accu-Chek Guide test strip Generic drug: glucose blood Use Accu Chek Guide test strips as  instructed to check blood sugar once daily.   Accu-Chek Guide test strip Generic drug: glucose blood USE TO CHECK BLOOD SUGARS THREE TIMES DAILY.   Accu-Chek Guide w/Device Kit 1 each by Does not apply route daily. Use accu chek guide meter to check blood sugar once daily.   Accu-Chek Softclix Lancets lancets 3 times daily As directed DXE11.65    alfuzosin 10 MG 24 hr tablet Commonly known as: UROXATRAL Take 10 mg by mouth daily.   Alogliptin-Pioglitazone 25-30 MG Tabs TAKE 1 TABLET DAILY   ALPRAZolam 0.5 MG tablet Commonly known as: XANAX Take 0.5 mg by mouth every other day as needed. Take 1/2 tablet by mouth every other day as needed.   atorvastatin 80 MG tablet Commonly known as: LIPITOR TAKE 1 TABLET DAILY   finasteride 5 MG tablet Commonly known as: PROSCAR   glimepiride 4 MG tablet Commonly known as: AMARYL TAKE 1 TABLET BY MOUTH TWICE A DAY BEFORE A MEAL   hydrocortisone-pramoxine 2.5-1 % rectal cream Commonly known as: ANALPRAM-HC   mometasone 50 MCG/ACT nasal spray Commonly known as: NASONEX USE 2 SPRAYS IN EACH NOSTRIL NASALLY ONCE A DAY AS NEEDED   nystatin cream Commonly known as: MYCOSTATIN APPLY TO AFFECTED AREA TWICE A DAY AS NEEDED   omeprazole 40 MG capsule Commonly known as: PRILOSEC as needed.   Rybelsus 3 MG Tabs Generic drug: Semaglutide TAKE 1 TABLET DAILY   sildenafil 25 MG tablet Commonly known as: VIAGRA Take 25 mg by mouth daily as needed for erectile dysfunction.   tadalafil 5 MG tablet Commonly known as: CIALIS Take 5 mg by mouth daily as needed for erectile dysfunction.       Allergies: No Known Allergies  Past Medical History:  Diagnosis Date  . Hypercholesterolemia     Past Surgical History:  Procedure Laterality Date  . MINOR HEMORRHOIDECTOMY N/A     Family History  Problem Relation Age of Onset  . Cancer Neg Hx     Social History:  reports that he has quit smoking. His smoking use included cigarettes. He has never used smokeless tobacco. No history on file for alcohol use and drug use.  Review of Systems:   Eye Exam last: 7/20; no retinopathy noted  HYPERLIPIDEMIA: Followed by PCP Has been taking 80 mg Lipitor and LDL has been below 100  He has had some aches and pains in his arms and legs and was told to try taking half tablet of Lipitor but not  clear if he is doing this LDL is higher and he may have missed a couple of doses of Lipitor  Lab Results  Component Value Date   CHOL 185 02/01/2020   HDL 58.30 02/01/2020   LDLCALC 119 (H) 02/01/2020   LDLDIRECT 134.6 11/18/2013   TRIG 42.0 02/01/2020   CHOLHDL 3 02/01/2020     Diabetic foot exam in 6/21 showed normal monofilament sensation in the toes and plantar surfaces, no skin lesions or ulcers on the feet and normal pedal pulses  No history of hypertension or microalbuminuria  BP Readings from Last 3 Encounters:  02/04/20 (!) 142/80  11/05/19 (!) 110/58  08/04/19 (!) 142/68   His annual eye exam was in 3/21 and was normal   Examination:   BP (!) 142/80 (BP Location: Left Arm, Patient Position: Sitting, Cuff Size: Small)   Pulse 78   Resp 18   Ht _0  (1.702 m)   Wt 152 lb 6 oz (69.1 kg)   SpO2 98%  BMI 23.87 kg/m   Body mass index is 23.87 kg/m.      Assesment/Plan:   Diabetes type 2 without significant obesity  See history of present illness for detailed discussion of current diabetes management, blood sugar patterns and problems identified  His A1c is now 5.7  Although he has not brought his meter for download and his blood sugars are appearing high today and in the lab his blood sugars are probably mostly high when he is getting more carbohydrate at breakfast Weight has gone down significantly and appears to be progressively having decreased appetite and weight loss since June Unlikely that this is from [place] which he has been taking since 1/21 Also feeling tired and weak  He has had lower blood sugars including some low normal sugars in the afternoon reduce his Amaryl to half tablet in the morning Stop Rybelsus for now and may consider restarting this when he regains his weight or alternative treatments He can cut back on the frequency of monitoring His wife is present today and discussed current blood sugar management, blood sugar targets and  medication management  Hyperlipidemia: LDL is relatively higher but he is already on 80 mg Lipitor.  However not clear if he is taking this regularly Since he has other issues such as muscle aches and weight loss will discuss treatment on his next visit, may consider switching to Crestor with or without Zetia He will need to reduce high fat intake with dairy products also especially cheeses  Weight loss: Etiology unclear, previously thyroid not enlarged Thyroid levels are normal He will see his PCP tomorrow and have further evaluation   There are no Patient Instructions on file for this visit.   Elayne Snare 02/04/2020, 1:24 PM

## 2020-04-18 ENCOUNTER — Other Ambulatory Visit (INDEPENDENT_AMBULATORY_CARE_PROVIDER_SITE_OTHER): Payer: Federal, State, Local not specified - PPO

## 2020-04-18 ENCOUNTER — Other Ambulatory Visit: Payer: Self-pay

## 2020-04-18 DIAGNOSIS — E119 Type 2 diabetes mellitus without complications: Secondary | ICD-10-CM | POA: Diagnosis not present

## 2020-04-18 LAB — BASIC METABOLIC PANEL
BUN: 24 mg/dL — ABNORMAL HIGH (ref 6–23)
CO2: 29 mEq/L (ref 19–32)
Calcium: 8.9 mg/dL (ref 8.4–10.5)
Chloride: 105 mEq/L (ref 96–112)
Creatinine, Ser: 1 mg/dL (ref 0.40–1.50)
GFR: 75.02 mL/min (ref >60.00)
Glucose, Bld: 218 mg/dL — ABNORMAL HIGH (ref 70–99)
Potassium: 4.1 mEq/L (ref 3.5–5.1)
Sodium: 140 mEq/L (ref 135–145)

## 2020-04-19 LAB — FRUCTOSAMINE: Fructosamine: 276 umol/L (ref 0–285)

## 2020-04-21 ENCOUNTER — Other Ambulatory Visit: Payer: Self-pay | Admitting: Endocrinology

## 2020-04-21 ENCOUNTER — Ambulatory Visit: Payer: Federal, State, Local not specified - PPO | Admitting: Endocrinology

## 2020-04-21 ENCOUNTER — Encounter: Payer: Self-pay | Admitting: Endocrinology

## 2020-04-21 ENCOUNTER — Other Ambulatory Visit: Payer: Self-pay

## 2020-04-21 VITALS — BP 138/76 | HR 80 | Ht 67.0 in | Wt 152.0 lb

## 2020-04-21 DIAGNOSIS — E7800 Pure hypercholesterolemia, unspecified: Secondary | ICD-10-CM

## 2020-04-21 DIAGNOSIS — E1165 Type 2 diabetes mellitus with hyperglycemia: Secondary | ICD-10-CM

## 2020-04-21 LAB — POCT GLYCOSYLATED HEMOGLOBIN (HGB A1C): Hemoglobin A1C: 5.4 % (ref 4.0–5.6)

## 2020-04-21 NOTE — Progress Notes (Signed)
Patient ID: Kenneth Lawson, male   DOB: Dec 21, 1945, 75 y.o.   MRN: [id number]   Reason for Appointment: Diabetes follow-up    History of Present Illness    Diagnosis: date of diagnosis: 1997.   Previous history: He was previously treated with glyburide, Actos and metformin.  Metformin was stopped because he had GI side effects  His blood sugars had been relatively high in the last 3-4 years and A1c had been consistently over 7.5% until his initial consultation He was started on Invokana in 10/13 which had helped modestly. His blood sugars had shown considerable improvement with  adding  Nesina since 2/14 .    He was also on Actos which he had stopped but blood sugars did not appear to worsen. .  The previous HbgA1c levels: 6.4 in 4/14,  7.4 in 12/13, prior range from 7.8-8.3 since 2011   RECENT history:   Oral hypoglycemic drugs: Oseni 25/30,   Amaryl 4 mg daily  His blood sugars are overall usually well controlled with A1c under 7   A1c is better at 5.4   Current blood sugar patterns, management and problems identified:  He apparently had lost more weight after his visit in 12/21  However with control of his anxiety and stopping Rybelsus his weight is back up to his previous visit in December  Despite stopping Rybelsus his blood sugars are not significantly higher except after breakfast  He is eating a large carbohydrate breakfast in the morning with oatmeal or cereal with fruit and bread and only sometimes will have a protein with this  He is still trying to walk at least a mile a day, based on his ability  He is using the boost supplement but he thinks this is diabetic  Blood sugars have been checked at various times and are not high after lunch or dinner  Also no hypoglycemic symptoms with taking Amaryl    Dinner recently around 8 pm   Side effects from medications: bloating, gas and GI distress from metformin, even 500 mg, balanitis from Invokana and  Jardiance  Monitors blood glucose: Up to 3x daily  Glucometer: . Blood Glucose readings    PRE-MEAL Fasting Lunch Dinner Bedtime Overall  Glucose range:      76-254  Mean/median:  108  177  118  127 137    Prior  PRE-MEAL Fasting Lunch Dinner Bedtime Overall  Glucose range:  73-112    101-159 49-245  Mean/median:  90  119  114  120 127   POST-MEAL PC Breakfast PC Lunch PC Dinner  Glucose range:     Mean/median:   175  112     Dietician visit: Most recent: 1997 at diagnosis.  .   Wt Readings from Last 3 Encounters:  04/21/20 152 lb (68.9 kg)  02/04/20 152 lb 6 oz (69.1 kg)  11/05/19 158 lb 12.8 oz (72 kg)    Lab Results  Component Value Date   HGBA1C 5.4 04/21/2020   HGBA1C 5.7 02/01/2020   HGBA1C 6.0 10/30/2019   Lab Results  Component Value Date   MICROALBUR 2.6 (H) 10/30/2019   LDLCALC 119 (H) 02/01/2020   CREATININE 1.00 04/18/2020    OTHER active problems: See review of systems   Office Visit on 04/21/2020  Component Date Value Ref Range Status  . Hemoglobin A1C 04/21/2020 5.4  4.0 - 5.6 % Final  Lab on 04/18/2020  Component Date Value Ref Range Status  . Fructosamine 04/18/2020 276  0 -  285 umol/L Final   Comment: Published reference interval for apparently healthy subjects between age 20 and 69 is 58 - 285 umol/L and in a poorly controlled diabetic population is 228 - 563 umol/L with a mean of 396 umol/L.   Marland Kitchen Sodium 04/18/2020 140  135 - 145 mEq/L Final  . Potassium 04/18/2020 4.1  3.5 - 5.1 mEq/L Final  . Chloride 04/18/2020 105  96 - 112 mEq/L Final  . CO2 04/18/2020 29  19 - 32 mEq/L Final  . Glucose, Bld 04/18/2020 218* 70 - 99 mg/dL Final  . BUN 04/18/2020 24* 6 - 23 mg/dL Final  . Creatinine, Ser 04/18/2020 1.00  0.40 - 1.50 mg/dL Final  . GFR 04/18/2020 75.02  >60.00 mL/min Final   Calculated using the CKD-EPI Creatinine Equation (2021)  . Calcium 04/18/2020 8.9  8.4 - 10.5 mg/dL Final    Allergies as of 04/21/2020   No Known  Allergies     Medication List       Accurate as of April 21, 2020  4:39 PM. If you have any questions, ask your nurse or doctor.        Accu-Chek Guide test strip Generic drug: glucose blood Use Accu Chek Guide test strips as instructed to check blood sugar once daily.   Accu-Chek Guide test strip Generic drug: glucose blood USE TO CHECK BLOOD SUGARS THREE TIMES DAILY.   Accu-Chek Guide w/Device Kit 1 each by Does not apply route daily. Use accu chek guide meter to check blood sugar once daily.   Accu-Chek Softclix Lancets lancets 3 times daily As directed DXE11.65   alfuzosin 10 MG 24 hr tablet Commonly known as: UROXATRAL Take 10 mg by mouth daily.   Alogliptin-Pioglitazone 25-30 MG Tabs TAKE 1 TABLET DAILY   ALPRAZolam 0.5 MG tablet Commonly known as: XANAX Take 0.5 mg by mouth every other day as needed. Take 1/2 tablet by mouth every other day as needed.   atorvastatin 80 MG tablet Commonly known as: LIPITOR TAKE 1 TABLET DAILY   finasteride 5 MG tablet Commonly known as: PROSCAR   glimepiride 4 MG tablet Commonly known as: AMARYL TAKE 1 TABLET BY MOUTH TWICE A DAY BEFORE A MEAL   hydrocortisone-pramoxine 2.5-1 % rectal cream Commonly known as: ANALPRAM-HC   mometasone 50 MCG/ACT nasal spray Commonly known as: NASONEX USE 2 SPRAYS IN EACH NOSTRIL NASALLY ONCE A DAY AS NEEDED   nystatin cream Commonly known as: MYCOSTATIN APPLY TO AFFECTED AREA TWICE A DAY AS NEEDED   omeprazole 40 MG capsule Commonly known as: PRILOSEC as needed.   Rybelsus 3 MG Tabs Generic drug: Semaglutide TAKE 1 TABLET DAILY   sildenafil 25 MG tablet Commonly known as: VIAGRA Take 25 mg by mouth daily as needed for erectile dysfunction.   tadalafil 5 MG tablet Commonly known as: CIALIS Take 5 mg by mouth daily as needed for erectile dysfunction.       Allergies: No Known Allergies  Past Medical History:  Diagnosis Date  . Hypercholesterolemia     Past  Surgical History:  Procedure Laterality Date  . MINOR HEMORRHOIDECTOMY N/A     Family History  Problem Relation Age of Onset  . Cancer Neg Hx     Social History:  reports that he has quit smoking. His smoking use included cigarettes. He has never used smokeless tobacco. No history on file for alcohol use and drug use.  Review of Systems:    HYPERLIPIDEMIA: Followed by PCP Has been taking 80  mg Lipitor and LDL has been below 100  He has had some aches and pains in his arms and legs and was told to try taking half tablet of Lipitor but not clear if he is doing this LDL is higher and he may have missed a couple of doses of Lipitor  Lab Results  Component Value Date   CHOL 185 02/01/2020   HDL 58.30 02/01/2020   LDLCALC 119 (H) 02/01/2020   LDLDIRECT 134.6 11/18/2013   TRIG 42.0 02/01/2020   CHOLHDL 3 02/01/2020     Diabetic foot exam in 6/21 showed normal monofilament sensation in the toes and plantar surfaces, no skin lesions or ulcers on the feet and normal pedal pulses  No history of hypertension or microalbuminuria  BP Readings from Last 3 Encounters:  04/21/20 138/76  02/04/20 (!) 142/80  11/05/19 (!) 110/58   His annual eye exam was in 3/21 and was normal   Examination:   BP 138/76   Pulse 80   Ht '5\' 7"'  (1.702 m)   Wt 152 lb (68.9 kg)   SpO2 97%   BMI 23.81 kg/m   Body mass index is 23.81 kg/m.      Assesment/Plan:   Diabetes type 2 without significant obesity  See history of present illness for detailed discussion of current diabetes management, blood sugar patterns and problems identified  His A1c is now 5.4 and fructosamine is relatively good at 276  Previous weight loss appears to be resolving However blood sugars are still fairly good even with stopping Rybelsus which may or may not have been contributing to his weight loss Only has high readings after breakfast because of eating a large amount of carbohydrate with very little protein He was  given ideas about getting more protein and he can add some cheese or eggs and cut back on portions of carbohydrates in the morning, he thinks he knows how to balance his meals  For now he will continue on the same regimen of Amaryl 4 mg and Oseni  Follow-up in 3 months unless having issues with blood sugars  There are no Patient Instructions on file for this visit.   Elayne Snare 04/21/2020, 4:39 PM

## 2020-05-17 ENCOUNTER — Other Ambulatory Visit: Payer: Self-pay | Admitting: Endocrinology

## 2020-07-06 ENCOUNTER — Other Ambulatory Visit: Payer: Self-pay

## 2020-07-06 ENCOUNTER — Emergency Department (HOSPITAL_BASED_OUTPATIENT_CLINIC_OR_DEPARTMENT_OTHER): Payer: Federal, State, Local not specified - PPO

## 2020-07-06 ENCOUNTER — Encounter (HOSPITAL_BASED_OUTPATIENT_CLINIC_OR_DEPARTMENT_OTHER): Payer: Self-pay | Admitting: *Deleted

## 2020-07-06 ENCOUNTER — Emergency Department (HOSPITAL_BASED_OUTPATIENT_CLINIC_OR_DEPARTMENT_OTHER)
Admission: EM | Admit: 2020-07-06 | Discharge: 2020-07-06 | Disposition: A | Discharge status: Home / Self Care | Payer: Federal, State, Local not specified - PPO | Attending: Emergency Medicine | Admitting: Emergency Medicine

## 2020-07-06 DIAGNOSIS — S50312A Abrasion of left elbow, initial encounter: Secondary | ICD-10-CM | POA: Diagnosis not present

## 2020-07-06 DIAGNOSIS — S0083XA Contusion of other part of head, initial encounter: Secondary | ICD-10-CM | POA: Insufficient documentation

## 2020-07-06 DIAGNOSIS — E119 Type 2 diabetes mellitus without complications: Secondary | ICD-10-CM | POA: Insufficient documentation

## 2020-07-06 DIAGNOSIS — I10 Essential (primary) hypertension: Secondary | ICD-10-CM | POA: Diagnosis not present

## 2020-07-06 DIAGNOSIS — Z7984 Long term (current) use of oral hypoglycemic drugs: Secondary | ICD-10-CM | POA: Diagnosis not present

## 2020-07-06 DIAGNOSIS — Z87891 Personal history of nicotine dependence: Secondary | ICD-10-CM | POA: Diagnosis not present

## 2020-07-06 DIAGNOSIS — S50311A Abrasion of right elbow, initial encounter: Secondary | ICD-10-CM | POA: Diagnosis not present

## 2020-07-06 DIAGNOSIS — W19XXXA Unspecified fall, initial encounter: Secondary | ICD-10-CM | POA: Insufficient documentation

## 2020-07-06 DIAGNOSIS — S0990XA Unspecified injury of head, initial encounter: Secondary | ICD-10-CM | POA: Diagnosis present

## 2020-07-06 DIAGNOSIS — T148XXA Other injury of unspecified body region, initial encounter: Secondary | ICD-10-CM

## 2020-07-06 HISTORY — DX: Type 2 diabetes mellitus without complications: E11.9

## 2020-07-06 HISTORY — DX: Unspecified osteoarthritis, unspecified site: M19.90

## 2020-07-06 HISTORY — DX: Essential (primary) hypertension: I10

## 2020-07-06 NOTE — ED Notes (Signed)
Abrasions to Bilateral arms.

## 2020-07-06 NOTE — ED Triage Notes (Signed)
Fell yesterday

## 2020-07-06 NOTE — ED Provider Notes (Signed)
[facility] EMERGENCY DEPT Provider Note   CSN: [id number] Arrival date & time: 07/06/20  1218     History Chief Complaint  Patient presents with  . Fall    Kenneth Lawson is a 75 y.o. male.  Fall yesterday.  Mechanical.  Saw her orthopedic doctor today as he is having bilateral elbow pain as he has abrasions there.  X-rays at the office were normal.  He was sent here for head scan.  He is not on blood thinners.  The history is provided by the patient.  Fall This is a new problem. The current episode started yesterday. The problem has been resolved. Associated symptoms include headaches. Pertinent negatives include no chest pain, no abdominal pain and no shortness of breath. Associated symptoms comments: Left arm pain . Nothing aggravates the symptoms. Nothing relieves the symptoms. He has tried nothing for the symptoms.       Past Medical History:  Diagnosis Date  . Arthritis   . Diabetes mellitus without complication ([place])   . Hypercholesterolemia   . Hypertension     Patient Active Problem List   Diagnosis Date Noted  . Chronic low back pain 12/17/2017  . Depression 03/21/2013  . Type II or unspecified type diabetes mellitus without mention of complication, uncontrolled 09/15/2012  . Pure hypercholesterolemia 09/15/2012  . BPH (benign prostatic hypertrophy) 09/15/2012    Past Surgical History:  Procedure Laterality Date  . LAPARASCOPIC MEDICAN ARCUATE LIGAMENT RELEASE    . MINOR HEMORRHOIDECTOMY N/A        Family History  Problem Relation Age of Onset  . Cancer Neg Hx     Social History   Tobacco Use  . Smoking status: Former Smoker    Types: Cigarettes  . Smokeless tobacco: Never Used  Substance Use Topics  . Alcohol use: Yes    Comment: rarely  . Drug use: Never    Home Medications Prior to Admission medications   Medication Sig Start Date End Date Taking? Authorizing Provider  alfuzosin (UROXATRAL) 10 MG 24 hr tablet Take 10  mg by mouth daily.   Yes [provider]  Alogliptin-Pioglitazone 25-30 MG TABS TAKE 1 TABLET DAILY 05/18/20  Yes Elayne Snare, MD  ALPRAZolam Duanne Moron) 0.5 MG tablet Take 0.5 mg by mouth every other day as needed. Take 1/2 tablet by mouth every other day as needed. 10/20/13  Yes [provider]  atorvastatin (LIPITOR) 80 MG tablet TAKE 1 TABLET DAILY 12/16/19  Yes Elayne Snare, MD  finasteride (PROSCAR) 5 MG tablet  01/20/14  Yes [provider]  glimepiride (AMARYL) 4 MG tablet TAKE 1 TABLET BY MOUTH TWICE A DAY BEFORE A MEAL 06/09/19  Yes Elayne Snare, MD  mometasone (NASONEX) 50 MCG/ACT nasal spray USE 2 SPRAYS IN EACH NOSTRIL NASALLY ONCE A DAY AS NEEDED 09/11/14  Yes [provider]  omeprazole (PRILOSEC) 40 MG capsule as needed. 10/22/12  Yes [provider]  ACCU-CHEK GUIDE test strip USE TO CHECK BLOOD SUGARS THREE TIMES DAILY. 04/21/20   Elayne Snare, MD  Accu-Chek Softclix Lancets lancets 3 times daily As directed DXE11.65 11/11/19   Elayne Snare, MD  Blood Glucose Monitoring Suppl (ACCU-CHEK GUIDE) w/Device KIT 1 each by Does not apply route daily. Use accu chek guide meter to check blood sugar once daily. 09/30/18   Elayne Snare, MD  glucose blood (ACCU-CHEK GUIDE) test strip Use Accu Chek Guide test strips as instructed to check blood sugar once daily. 06/23/19   Elayne Snare, MD  hydrocortisone-pramoxine [facility]) 2.5-1 % rectal cream  03/12/13   [provider]  tadalafil (CIALIS) 5 MG tablet Take 5 mg by mouth daily as needed for erectile dysfunction.    [provider]    Allergies    Patient has no known allergies.  Review of Systems   Review of Systems  Constitutional: Negative for chills and fever.  HENT: Negative for ear pain and sore throat.   Eyes: Negative for pain and visual disturbance.  Respiratory: Negative for cough and shortness of breath.   Cardiovascular: Negative for chest pain and palpitations.   Gastrointestinal: Negative for abdominal pain and vomiting.  Genitourinary: Negative for dysuria and hematuria.  Musculoskeletal: Negative for arthralgias and back pain.  Skin: Negative for color change and rash.  Neurological: Positive for headaches. Negative for seizures and syncope.  All other systems reviewed and are negative.   Physical Exam Updated Vital Signs BP (!) 133/98   Pulse 73   Resp 20   Ht 5' 8.5" (1.74 m)   Wt 70.3 kg   SpO2 100%   BMI 23.22 kg/m   Physical Exam Vitals and nursing note reviewed.  Constitutional:      Appearance: He is well-developed.  HENT:     Head: Normocephalic and atraumatic.  Eyes:     Extraocular Movements: Extraocular movements intact.     Conjunctiva/sclera: Conjunctivae normal.     Pupils: Pupils are equal, round, and reactive to light.  Cardiovascular:     Rate and Rhythm: Normal rate and regular rhythm.     Pulses: Normal pulses.     Heart sounds: Normal heart sounds. No murmur heard.   Pulmonary:     Effort: Pulmonary effort is normal. No respiratory distress.     Breath sounds: Normal breath sounds.  Abdominal:     Palpations: Abdomen is soft.     Tenderness: There is no abdominal tenderness.  Musculoskeletal:        General: Tenderness present. Normal range of motion.     Cervical back: Normal range of motion and neck supple. No tenderness.     Comments: No midline spinal tenderness, tenderness to bilateral elbows  Skin:    General: Skin is warm and dry.     Comments: Abrasions to bilateral elbow, right side of the face on the forehead  Neurological:     General: No focal deficit present.     Mental Status: He is alert and oriented to person, place, and time.     Cranial Nerves: No cranial nerve deficit.     Sensory: No sensory deficit.     Motor: No weakness.     Coordination: Coordination normal.     Gait: Gait normal.     Comments: 5+ out of 5 strength throughout, normal sensation, no drift, normal  finger-nose-finger, normal speech     ED Results / Procedures / Treatments   Labs (all labs ordered are listed, but only abnormal results are displayed) Labs Reviewed - No data to display  EKG None  Radiology CT Head Wo Contrast  Result Date: 07/06/2020 CLINICAL DATA:  Fall. Additional provided: Fall yesterday, hitting forehead on rock. EXAM: CT HEAD WITHOUT CONTRAST CT CERVICAL SPINE WITHOUT CONTRAST TECHNIQUE: Multidetector CT imaging of the head and cervical spine was performed following the standard protocol without intravenous contrast. Multiplanar CT image reconstructions of the cervical spine were also generated. COMPARISON:  No pertinent prior exams available for comparison. FINDINGS: CT HEAD FINDINGS Brain: Cerebral volume is normal for  age. Moderate patchy and ill-defined hypoattenuation within the cerebral white matter, nonspecific but compatible with chronic small vessel ischemic disease. There is no acute intracranial hemorrhage. No demarcated cortical infarct. No extra-axial fluid collection. No evidence of intracranial mass. No midline shift. Vascular: No hyperdense vessel.  Atherosclerotic calcifications. Skull: Normal. Negative for fracture or focal lesion. Sinuses/Orbits: Visualized orbits show no acute finding. Extensive partial opacification of the partially imaged left maxillary sinus. Other: Right forehead/periorbital soft tissue swelling, partially imaged. Incompletely imaged polypoid soft tissue within the posterior nasopharynx measuring at least 3.5 cm. CT CERVICAL SPINE FINDINGS Alignment: Cervical dextrocurvature. Reversal of the expected cervical lordosis. No significant spondylolisthesis. Skull base and vertebrae: No acute fracture. No primary bone lesion or focal pathologic process. Soft tissues and spinal canal: No prevertebral fluid or swelling. No visible canal hematoma. Disc levels: Cervical spondylosis with multilevel disc space narrowing, disc bulges, endplate  spurring, posterior disc osteophytes, uncovertebral hypertrophy and facet arthrosis. Disc space narrowing is advanced at C4-C5, C5-C6 and C6-C7. Multilevel spinal canal stenosis. Most notably, there is apparent moderate/severe spinal canal stenosis at C4-C5, C5-C6 and C6-C7. Degenerative changes are also present at the C1-C2 articulation. Upper chest: No consolidation within the imaged lung apices. No visible pneumothorax. IMPRESSION: CT head: 1. No evidence of acute intracranial abnormality. 2. Right forehead and periorbital soft tissue swelling, partially imaged. 3. Moderate cerebral white matter chronic small vessel ischemic disease. 4. Extensive partial opacification of the partially imaged left maxillary sinus. 5. Incompletely imaged polypoid soft tissue within the posterior nasopharynx measuring at least 3.5 cm. Nonemergent maxillofacial CT and ENT consultation are recommended. CT cervical spine: 1. No evidence of acute fracture to the cervical spine. 2. Nonspecific reversal of the expected cervical lordosis. 3. Cervical dextrocurvature. 4. Cervical spondylosis. Notably, there is apparent moderate/severe spinal canal stenosis at C4-C5, C5-C6 and C6-C7. Multilevel bony neural foraminal narrowing. Electronically Signed   By: Kellie Simmering DO   On: 07/06/2020 13:21   CT Cervical Spine Wo Contrast  Result Date: 07/06/2020 CLINICAL DATA:  Fall. Additional provided: Fall yesterday, hitting forehead on rock. EXAM: CT HEAD WITHOUT CONTRAST CT CERVICAL SPINE WITHOUT CONTRAST TECHNIQUE: Multidetector CT imaging of the head and cervical spine was performed following the standard protocol without intravenous contrast. Multiplanar CT image reconstructions of the cervical spine were also generated. COMPARISON:  No pertinent prior exams available for comparison. FINDINGS: CT HEAD FINDINGS Brain: Cerebral volume is normal for age. Moderate patchy and ill-defined hypoattenuation within the cerebral white matter, nonspecific  but compatible with chronic small vessel ischemic disease. There is no acute intracranial hemorrhage. No demarcated cortical infarct. No extra-axial fluid collection. No evidence of intracranial mass. No midline shift. Vascular: No hyperdense vessel.  Atherosclerotic calcifications. Skull: Normal. Negative for fracture or focal lesion. Sinuses/Orbits: Visualized orbits show no acute finding. Extensive partial opacification of the partially imaged left maxillary sinus. Other: Right forehead/periorbital soft tissue swelling, partially imaged. Incompletely imaged polypoid soft tissue within the posterior nasopharynx measuring at least 3.5 cm. CT CERVICAL SPINE FINDINGS Alignment: Cervical dextrocurvature. Reversal of the expected cervical lordosis. No significant spondylolisthesis. Skull base and vertebrae: No acute fracture. No primary bone lesion or focal pathologic process. Soft tissues and spinal canal: No prevertebral fluid or swelling. No visible canal hematoma. Disc levels: Cervical spondylosis with multilevel disc space narrowing, disc bulges, endplate spurring, posterior disc osteophytes, uncovertebral hypertrophy and facet arthrosis. Disc space narrowing is advanced at C4-C5, C5-C6 and C6-C7. Multilevel spinal canal stenosis. Most notably, there is apparent moderate/severe  spinal canal stenosis at C4-C5, C5-C6 and C6-C7. Degenerative changes are also present at the C1-C2 articulation. Upper chest: No consolidation within the imaged lung apices. No visible pneumothorax. IMPRESSION: CT head: 1. No evidence of acute intracranial abnormality. 2. Right forehead and periorbital soft tissue swelling, partially imaged. 3. Moderate cerebral white matter chronic small vessel ischemic disease. 4. Extensive partial opacification of the partially imaged left maxillary sinus. 5. Incompletely imaged polypoid soft tissue within the posterior nasopharynx measuring at least 3.5 cm. Nonemergent maxillofacial CT and ENT  consultation are recommended. CT cervical spine: 1. No evidence of acute fracture to the cervical spine. 2. Nonspecific reversal of the expected cervical lordosis. 3. Cervical dextrocurvature. 4. Cervical spondylosis. Notably, there is apparent moderate/severe spinal canal stenosis at C4-C5, C5-C6 and C6-C7. Multilevel bony neural foraminal narrowing. Electronically Signed   By: Kellie Simmering DO   On: 07/06/2020 13:21    Procedures Procedures   Medications Ordered in ED Medications - No data to display  ED Course  I have reviewed the triage vital signs and the nursing notes.  Pertinent labs & imaging results that were available during my care of the patient were reviewed by me and considered in my medical decision making (see chart for details).    MDM Rules/Calculators/A&P                          Kenneth Lawson is here after mechanical fall yesterday.  Was seen by orthopedics today and had x-rays of his extremities and chest that showed no fractures.  He was sent here for head CT given hematoma to his forehead and fall.  He is neurologically intact.  Overall he appears well.  He is not on blood thinners.  Head and neck CT were normal.  Given reassurance and discharged in ED in good condition.  No midline spinal pain otherwise.  This chart was dictated using voice recognition software.  Despite best efforts to proofread,  errors can occur which can change the documentation meaning.    Final Clinical Impression(s) / ED Diagnoses Final diagnoses:  Abrasion    Rx / DC Orders ED Discharge Orders    None       Lennice Sites, DO 07/06/20 1328

## 2020-07-19 ENCOUNTER — Other Ambulatory Visit: Payer: Federal, State, Local not specified - PPO

## 2020-07-19 ENCOUNTER — Other Ambulatory Visit (INDEPENDENT_AMBULATORY_CARE_PROVIDER_SITE_OTHER): Payer: Federal, State, Local not specified - PPO

## 2020-07-19 ENCOUNTER — Other Ambulatory Visit: Payer: Self-pay

## 2020-07-19 DIAGNOSIS — E1165 Type 2 diabetes mellitus with hyperglycemia: Secondary | ICD-10-CM | POA: Diagnosis not present

## 2020-07-19 LAB — COMPREHENSIVE METABOLIC PANEL
ALT: 15 U/L (ref 0–53)
AST: 15 U/L (ref 0–37)
Albumin: 4 g/dL (ref 3.5–5.2)
Alkaline Phosphatase: 82 U/L (ref 39–117)
BUN: 21 mg/dL (ref 6–23)
CO2: 29 mEq/L (ref 19–32)
Calcium: 9.2 mg/dL (ref 8.4–10.5)
Chloride: 105 mEq/L (ref 96–112)
Creatinine, Ser: 1 mg/dL (ref 0.40–1.50)
GFR: 74.89 mL/min (ref >60.00)
Glucose, Bld: 170 mg/dL — ABNORMAL HIGH (ref 70–99)
Potassium: 3.9 mEq/L (ref 3.5–5.1)
Sodium: 139 mEq/L (ref 135–145)
Total Bilirubin: 0.5 mg/dL (ref 0.2–1.2)
Total Protein: 6.7 g/dL (ref 6.0–8.3)

## 2020-07-19 LAB — LIPID PANEL
Cholesterol: 170 mg/dL (ref 0–200)
HDL: 56.6 mg/dL (ref >39.00)
LDL Cholesterol: 103 mg/dL — ABNORMAL HIGH (ref 0–99)
NonHDL: 113.77
Total CHOL/HDL Ratio: 3
Triglycerides: 52 mg/dL (ref 0.0–149.0)
VLDL: 10.4 mg/dL (ref 0.0–40.0)

## 2020-07-19 LAB — HEMOGLOBIN A1C: Hgb A1c MFr Bld: 7 % — ABNORMAL HIGH (ref 4.6–6.5)

## 2020-07-20 ENCOUNTER — Other Ambulatory Visit: Payer: Self-pay | Admitting: Endocrinology

## 2020-07-21 ENCOUNTER — Other Ambulatory Visit: Payer: Self-pay

## 2020-07-21 ENCOUNTER — Ambulatory Visit: Payer: Federal, State, Local not specified - PPO | Admitting: Endocrinology

## 2020-07-21 ENCOUNTER — Encounter: Payer: Self-pay | Admitting: Endocrinology

## 2020-07-21 VITALS — BP 120/70 | HR 77 | Ht 68.5 in | Wt 158.2 lb

## 2020-07-21 DIAGNOSIS — E7800 Pure hypercholesterolemia, unspecified: Secondary | ICD-10-CM

## 2020-07-21 DIAGNOSIS — E1165 Type 2 diabetes mellitus with hyperglycemia: Secondary | ICD-10-CM

## 2020-07-21 MED ORDER — RYBELSUS 3 MG PO TABS: 3.0000 mg | ORAL_TABLET | Freq: Every day | ORAL | 0 refills | Status: DC

## 2020-07-21 NOTE — Patient Instructions (Signed)
Check blood sugars on waking up 2-3 days a week  Also check blood sugars about 2 hours after meals and do this after different meals by rotation  Recommended blood sugar levels on waking up are 90-130 and about 2 hours after meal is 130-160  Please bring your blood sugar monitor to each visit, thank you   

## 2020-07-21 NOTE — Progress Notes (Signed)
  Patient ID: Marcin Sgroi, male   DOB: 11/25/1945, 75 y.o.   MRN: [id number]   Reason for Appointment: Diabetes follow-up    History of Present Illness    Diagnosis: date of diagnosis: 1997.   Previous history: He was previously treated with glyburide, Actos and metformin.  Metformin was stopped because he had GI side effects  His blood sugars had been relatively high in the last 3-4 years and A1c had been consistently over 7.5% until his initial consultation He was started on Invokana in 10/13 which had helped modestly. His blood sugars had shown considerable improvement with  adding  Nesina since 2/14 .    He was also on Actos which he had stopped but blood sugars did not appear to worsen. .  The previous HbgA1c levels: 6.4 in 4/14,  7.4 in 12/13, prior range from 7.8-8.3 since 2011   RECENT history:   Oral hypoglycemic drugs: Oseni 25/30,   Amaryl 4 mg daily  A1c is higher at 7%, previously was at 5.4   Current blood sugar patterns, management and problems identified:  His A1c is generally lower than expected from his blood sugars  Previously had lost in 2021 but he thinks this was from anxiety and depression and not the Rybelsus  More recently with stopping Rybelsus he is gaining back some weight but also has blood sugars as high as 322 postprandially  Not checking blood sugars very much lately mostly in the afternoons and early evenings  His blood sugar after lunch on the lab was 170  Has only 1 fasting reading of 93 in the morning  He thinks he can do better with his diet but today had a large bagel causing his sugar to be over 300 postprandially  He is still trying to walk regularly and except for when he has difficulties with walking he is usually trying to walk 2 miles a day   Dinner recently around 8 pm   Side effects from medications: bloating, gas and GI distress from metformin, even 500 mg, balanitis from Invokana and Jardiance  Monitors blood  glucose: Up to 3x daily  Glucometer: . Blood Glucose readings    PRE-MEAL Fasting Lunch  4-7 PM Bedtime Overall  Glucose range:  93   86-182   86-322  Mean/median:     148   POST-MEAL PC Breakfast PC Lunch PC Dinner  Glucose range:   138-322   Mean/median:       Previously:  PRE-MEAL Fasting Lunch Dinner Bedtime Overall  Glucose range:      76-254  Mean/median:  108  177  118  127 137     Dietician visit: Most recent: 1997 at diagnosis.  .   Wt Readings from Last 3 Encounters:  07/21/20 158 lb 3.2 oz (71.8 kg)  07/06/20 155 lb (70.3 kg)  04/21/20 152 lb (68.9 kg)    Lab Results  Component Value Date   HGBA1C 7.0 (H) 07/19/2020   HGBA1C 5.4 04/21/2020   HGBA1C 5.7 02/01/2020   Lab Results  Component Value Date   MICROALBUR 2.6 (H) 10/30/2019   LDLCALC 103 (H) 07/19/2020   CREATININE 1.00 07/19/2020    OTHER active problems: See review of systems   Lab on 07/19/2020  Component Date Value Ref Range Status  . Cholesterol 07/19/2020 170  0 - 200 mg/dL Final   ATP III Classification       Desirable:  < 200 mg/dL                 Borderline High:  200 - 239 mg/dL          High:  > = 240 mg/dL  . Triglycerides 07/19/2020 52.0  0.0 - 149.0 mg/dL Final   Normal:  <150 mg/dLBorderline High:  150 - 199 mg/dL  . HDL 07/19/2020 56.60  >39.00 mg/dL Final  . VLDL 07/19/2020 10.4  0.0 - 40.0 mg/dL Final  . LDL Cholesterol 07/19/2020 103* 0 - 99 mg/dL Final  . Total CHOL/HDL Ratio 07/19/2020 3   Final                  Men          Women1/2 Average Risk     3.4          3.3Average Risk          5.0          4.42X Average Risk          9.6          7.13X Average Risk          15.0          11.0                      . NonHDL 07/19/2020 113.77   Final   NOTE:  Non-HDL goal should be 30 mg/dL higher than patient's LDL goal (i.e. LDL goal of < 70 mg/dL, would have non-HDL goal of < 100 mg/dL)  . Sodium 07/19/2020 139  135 - 145 mEq/L Final  . Potassium 07/19/2020 3.9  3.5 - 5.1  mEq/L Final  . Chloride 07/19/2020 105  96 - 112 mEq/L Final  . CO2 07/19/2020 29  19 - 32 mEq/L Final  . Glucose, Bld 07/19/2020 170* 70 - 99 mg/dL Final  . BUN 07/19/2020 21  6 - 23 mg/dL Final  . Creatinine, Ser 07/19/2020 1.00  0.40 - 1.50 mg/dL Final  . Total Bilirubin 07/19/2020 0.5  0.2 - 1.2 mg/dL Final  . Alkaline Phosphatase 07/19/2020 82  39 - 117 U/L Final  . AST 07/19/2020 15  0 - 37 U/L Final  . ALT 07/19/2020 15  0 - 53 U/L Final  . Total Protein 07/19/2020 6.7  6.0 - 8.3 g/dL Final  . Albumin 07/19/2020 4.0  3.5 - 5.2 g/dL Final  . GFR 07/19/2020 74.89  >60.00 mL/min Final   Calculated using the CKD-EPI Creatinine Equation (2021)  . Calcium 07/19/2020 9.2  8.4 - 10.5 mg/dL Final  . Hgb A1c MFr Bld 07/19/2020 7.0* 4.6 - 6.5 % Final   Glycemic Control Guidelines for People with Diabetes:Non Diabetic:  <6%Goal of Therapy: <7%Additional Action Suggested:  >8%     Allergies as of 07/21/2020      Reactions   Metformin Diarrhea, Other (See Comments)      Medication List       Accurate as of Jul 21, 2020  1:06 PM. If you have any questions, ask your nurse or doctor.        Accu-Chek Guide test strip Generic drug: glucose blood Use Accu Chek Guide test strips as instructed to check blood sugar once daily.   Accu-Chek Guide test strip Generic drug: glucose blood USE TO CHECK BLOOD SUGARS THREE TIMES DAILY.   Accu-Chek Guide w/Device Kit 1 each by Does not apply route daily. Use accu chek guide meter to check blood sugar once daily.   Accu-Chek Softclix Lancets lancets 3 times daily As directed DXE11.65  alfuzosin 10 MG 24 hr tablet Commonly known as: UROXATRAL Take 10 mg by mouth daily.   Alogliptin-Pioglitazone 25-30 MG Tabs TAKE 1 TABLET DAILY   ALPRAZolam 0.5 MG tablet Commonly known as: XANAX Take 0.5 mg by mouth every other day as needed. Take 1/2 tablet by mouth every other day as needed.   atorvastatin 80 MG tablet Commonly known as: LIPITOR TAKE  1 TABLET DAILY   finasteride 5 MG tablet Commonly known as: PROSCAR   glimepiride 4 MG tablet Commonly known as: AMARYL TAKE 1 TABLET BY MOUTH TWICE A DAY BEFORE A MEAL   hydrocortisone-pramoxine 2.5-1 % rectal cream Commonly known as: ANALPRAM-HC   mometasone 50 MCG/ACT nasal spray Commonly known as: NASONEX USE 2 SPRAYS IN EACH NOSTRIL NASALLY ONCE A DAY AS NEEDED   omeprazole 40 MG capsule Commonly known as: PRILOSEC as needed.   tadalafil 5 MG tablet Commonly known as: CIALIS Take 5 mg by mouth daily as needed for erectile dysfunction.       Allergies:  Allergies  Allergen Reactions  . Metformin Diarrhea and Other (See Comments)    Past Medical History:  Diagnosis Date  . Arthritis   . Diabetes mellitus without complication ([place])   . Hypercholesterolemia   . Hypertension     Past Surgical History:  Procedure Laterality Date  . LAPARASCOPIC MEDICAN ARCUATE LIGAMENT RELEASE    . MINOR HEMORRHOIDECTOMY N/A     Family History  Problem Relation Age of Onset  . Cancer Neg Hx     Social History:  reports that he has quit smoking. His smoking use included cigarettes. He has never used smokeless tobacco. He reports current alcohol use. He reports that he does not use drugs.  Review of Systems:    HYPERLIPIDEMIA: Followed by PCP Has been taking 80 mg Lipitor and LDL as follows  He does not think he has Lipitor is causing any joint or muscle pains LDL is higher and he believes this is from inconsistent diet such as eating more butter  LDL had been 94 in June 21  Lab Results  Component Value Date   CHOL 170 07/19/2020   CHOL 185 02/01/2020   CHOL 159 07/31/2019   Lab Results  Component Value Date   HDL 56.60 07/19/2020   HDL 58.30 02/01/2020   HDL 53.30 07/31/2019   Lab Results  Component Value Date   LDLCALC 103 (H) 07/19/2020   LDLCALC 119 (H) 02/01/2020   LDLCALC 94 07/31/2019   Lab Results  Component Value Date   TRIG 52.0 07/19/2020    TRIG 42.0 02/01/2020   TRIG 58.0 07/31/2019   Lab Results  Component Value Date   CHOLHDL 3 07/19/2020   CHOLHDL 3 02/01/2020   CHOLHDL 3 07/31/2019   Lab Results  Component Value Date   LDLDIRECT 134.6 11/18/2013      Diabetic foot exam in 6/21 showed normal monofilament sensation in the toes and plantar surfaces, no skin lesions or ulcers on the feet and normal pedal pulses  No history of hypertension or microalbuminuria  BP Readings from Last 3 Encounters:  07/21/20 120/70  07/06/20 (!) 136/108  04/21/20 138/76   His annual eye exam was in 3/21 and was normal   Examination:   BP 120/70   Pulse 77   Ht 5' 8.5" (1.74 m)   Wt 158 lb 3.2 oz (71.8 kg)   SpO2 97%   BMI 23.70 kg/m   Body mass index is 23.7 kg/m.  Assesment/Plan:   Diabetes type 2 without significant obesity  See history of present illness for detailed discussion of current diabetes management, blood sugar patterns and problems identified  His A1c is now 7% compared to 5.4  As discussed above his A1c is slightly lower than expected for his blood sugars Not monitoring very much at home lately and likely missing postprandial hyperglycemia since morning sugars are as low as 93 Discussed need for better control, improvement of diet with cutting back on higher carbohydrate and high fat meals He does try to walk fairly regularly as tolerated  Since he has a large supply of Rybelsus still at home he is willing to try this again; he thinks he did not have unusual weight loss with this medication Reminded him to take this 30 minutes before breakfast or coffee He will cut back on carbohydrates and portion Needs to check more readings after meals  Discussed LDL targets and he thinks he can cut back on foods like butter Give him a list of high saturated fat foods Consider adding Zetia on the next visit if LDL still high or switching to Crestor 40  There are no Patient Instructions on file for this  visit.   Elayne Snare 07/21/2020, 1:06 PM

## 2020-07-31 IMAGING — DX CHEST - 2 VIEW
2 series · 2 of 2 positions shown · non-contrast
Comparison: None.

CLINICAL DATA: Left-sided chest discomfort for 1 week

EXAM:
CHEST - 2 VIEW

[chest pa]
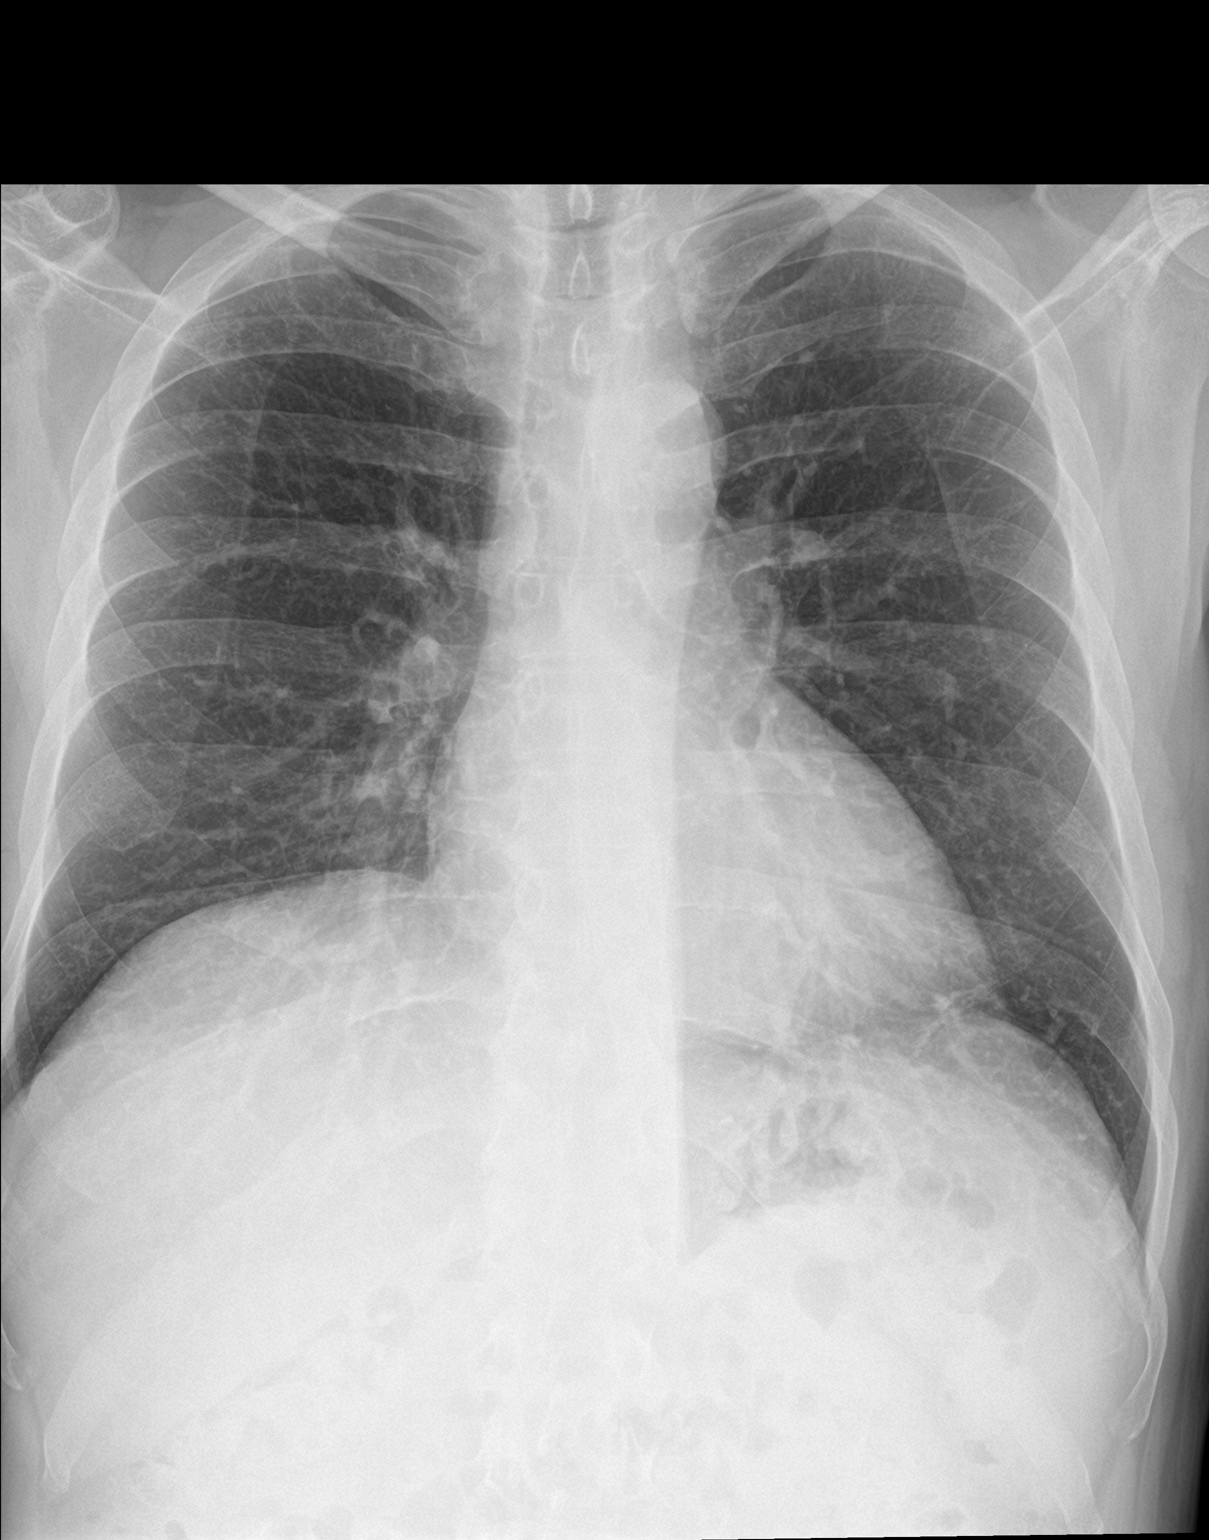

[chest lat]
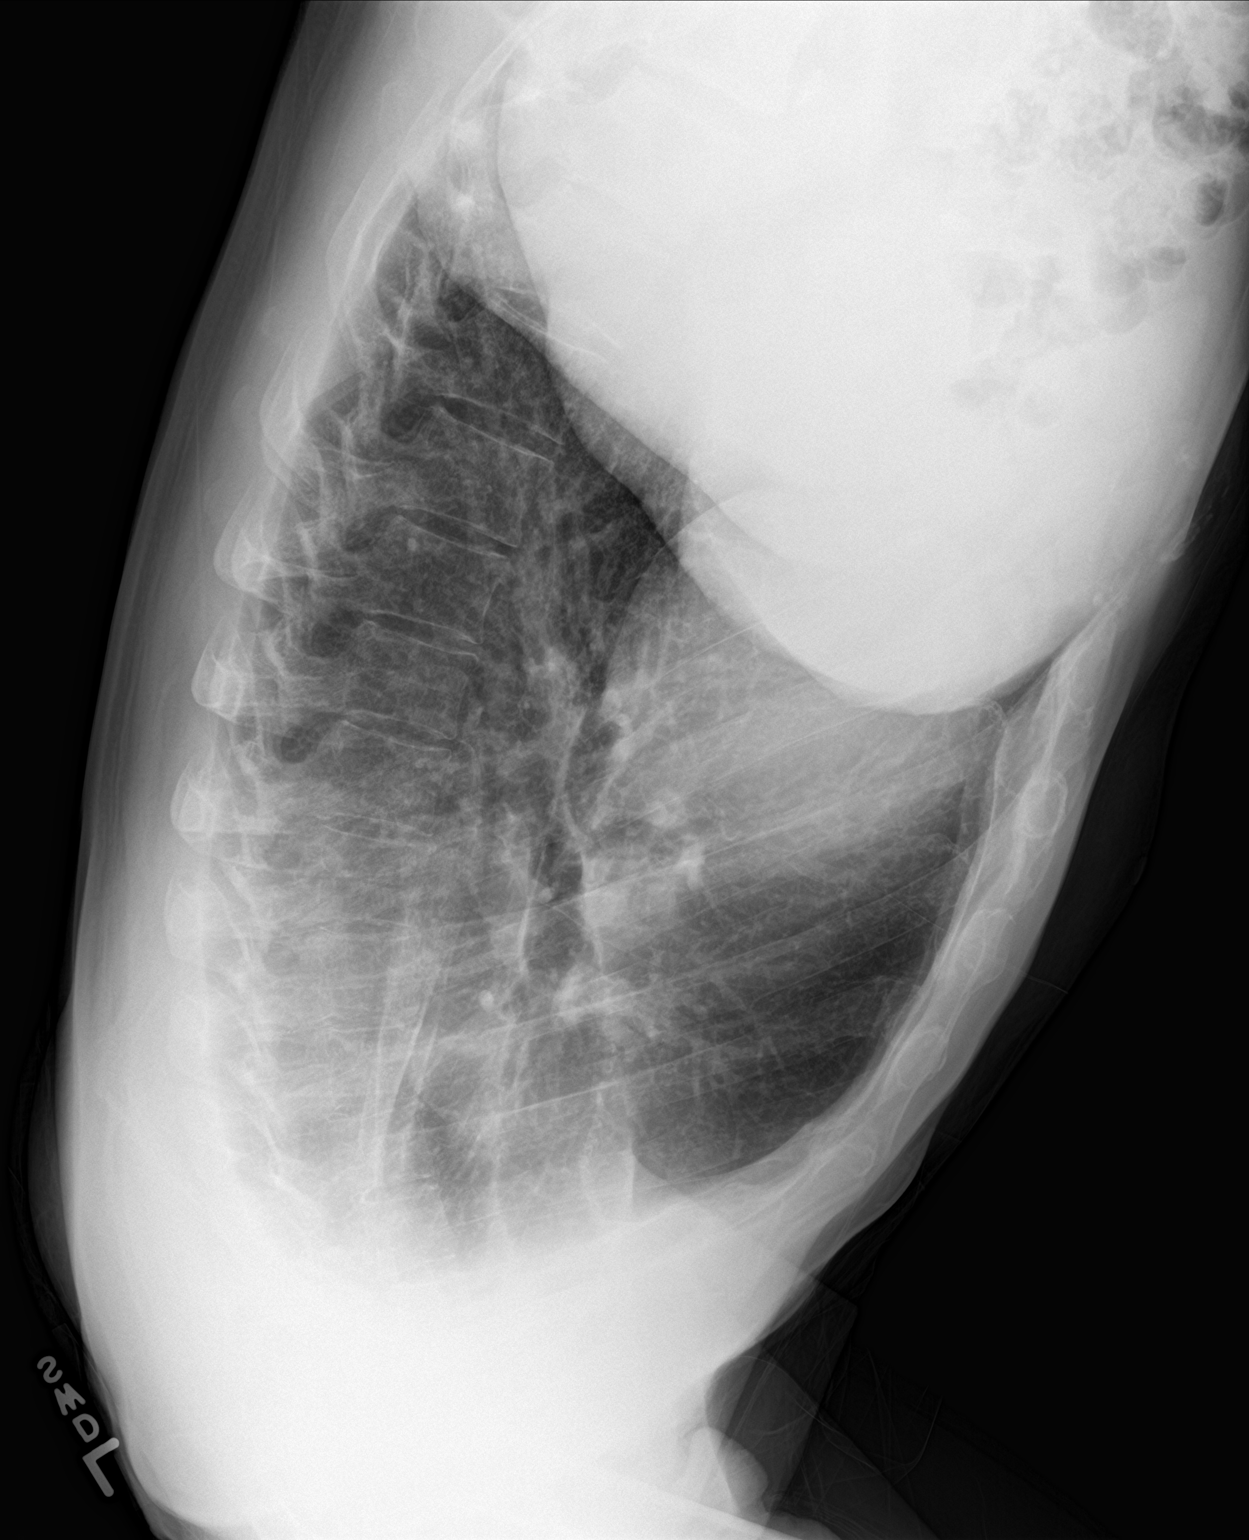

[2 of 2 positions shown; findings below may reference images not displayed]

FINDINGS: The heart size and mediastinal contours are within normal limits.
Both lungs are clear. The visualized skeletal structures are
unremarkable.
IMPRESSION: No active cardiopulmonary disease.

## 2020-10-16 ENCOUNTER — Other Ambulatory Visit: Payer: Self-pay | Admitting: Endocrinology

## 2020-10-17 ENCOUNTER — Other Ambulatory Visit: Payer: Self-pay

## 2020-10-17 ENCOUNTER — Other Ambulatory Visit (INDEPENDENT_AMBULATORY_CARE_PROVIDER_SITE_OTHER): Payer: Federal, State, Local not specified - PPO

## 2020-10-17 DIAGNOSIS — E7800 Pure hypercholesterolemia, unspecified: Secondary | ICD-10-CM | POA: Diagnosis not present

## 2020-10-17 DIAGNOSIS — E1165 Type 2 diabetes mellitus with hyperglycemia: Secondary | ICD-10-CM

## 2020-10-17 LAB — LIPID PANEL
Cholesterol: 164 mg/dL (ref 0–200)
HDL: 52.8 mg/dL (ref >39.00)
LDL Cholesterol: 97 mg/dL (ref 0–99)
NonHDL: 111.61
Total CHOL/HDL Ratio: 3
Triglycerides: 72 mg/dL (ref 0.0–149.0)
VLDL: 14.4 mg/dL (ref 0.0–40.0)

## 2020-10-17 LAB — COMPREHENSIVE METABOLIC PANEL
ALT: 16 U/L (ref 0–53)
AST: 15 U/L (ref 0–37)
Albumin: 3.9 g/dL (ref 3.5–5.2)
Alkaline Phosphatase: 71 U/L (ref 39–117)
BUN: 23 mg/dL (ref 6–23)
CO2: 27 mEq/L (ref 19–32)
Calcium: 9.2 mg/dL (ref 8.4–10.5)
Chloride: 107 mEq/L (ref 96–112)
Creatinine, Ser: 1.14 mg/dL (ref 0.40–1.50)
GFR: 63.88 mL/min (ref >60.00)
Glucose, Bld: 179 mg/dL — ABNORMAL HIGH (ref 70–99)
Potassium: 4.2 mEq/L (ref 3.5–5.1)
Sodium: 140 mEq/L (ref 135–145)
Total Bilirubin: 0.6 mg/dL (ref 0.2–1.2)
Total Protein: 6.7 g/dL (ref 6.0–8.3)

## 2020-10-17 LAB — MICROALBUMIN / CREATININE URINE RATIO
Creatinine,U: 193.1 mg/dL
Microalb Creat Ratio: 1.4 mg/g (ref 0.0–30.0)
Microalb, Ur: 2.7 mg/dL — ABNORMAL HIGH (ref 0.0–1.9)

## 2020-10-17 LAB — HEMOGLOBIN A1C: Hgb A1c MFr Bld: 6.7 % — ABNORMAL HIGH (ref 4.6–6.5)

## 2020-10-20 ENCOUNTER — Other Ambulatory Visit: Payer: Self-pay

## 2020-10-20 ENCOUNTER — Ambulatory Visit: Payer: Federal, State, Local not specified - PPO | Admitting: Endocrinology

## 2020-10-20 ENCOUNTER — Encounter: Payer: Self-pay | Admitting: Endocrinology

## 2020-10-20 VITALS — BP 142/70 | HR 68 | Ht 68.5 in | Wt 159.0 lb

## 2020-10-20 DIAGNOSIS — E7800 Pure hypercholesterolemia, unspecified: Secondary | ICD-10-CM

## 2020-10-20 DIAGNOSIS — E1165 Type 2 diabetes mellitus with hyperglycemia: Secondary | ICD-10-CM | POA: Diagnosis not present

## 2020-10-20 NOTE — Patient Instructions (Signed)
Take Rybelsus and call if not affecting appetite call for Rx  Check blood sugars on waking up 1-2 days a week  Also check blood sugars about 2 hours after meals and do this after different meals by rotation  Recommended blood sugar levels on waking up are 90-130 and about 2 hours after meal is 130-160  Please bring your blood sugar monitor to each visit, thank you

## 2020-10-20 NOTE — Progress Notes (Addendum)
Patient ID: Kenneth Lawson, male   DOB: 1945/07/21, 75 y.o.   MRN: [id number]   Reason for Appointment: Diabetes follow-up    History of Present Illness    Diagnosis: date of diagnosis: 1997.   Previous history: He was previously treated with glyburide, Actos and metformin.  Metformin was stopped because he had GI side effects  His blood sugars had been relatively high in the last 3-4 years and A1c had been consistently over 7.5% until his initial consultation He was started on Invokana in 10/13 which had helped modestly. His blood sugars had shown considerable improvement with  adding  Nesina since 2/14 .    He was also on Actos which he had stopped but blood sugars did not appear to worsen. .  The previous HbgA1c levels: 6.4 in 4/14,  7.4 in 12/13, prior range from 7.8-8.3 since 2011   RECENT history:   Oral hypoglycemic drugs: Oseni 25/30,   Amaryl 4 mg daily  A1c is at 6.7%, previously was 7   Current blood sugar patterns, management and problems identified: His A1c is now lower than before However could not download his monitor which was displaying an error  He thinks that his blood sugars are only high when he has some carbohydrate like cereal or bread in the morning; blood sugars better if he has more protein but he does have a liking for carbohydrates However his lab glucose was 179 postprandially  He says he feels fairly good and does not have any decreased appetite  His weight appears to be leveled off No hypoglycemia with current 4 mg dose of Amaryl He is still trying to walk regularly and also go to the gym regularly   [place] usually around 8 pm   Side effects from medications: bloating, gas and GI distress from metformin, even 500 mg, balanitis from Invokana and Jardiance  Monitors blood glucose: Up to 3x daily  Glucometer: . Blood Glucose readings not available   PRE-MEAL Fasting Lunch  4-7 PM Bedtime Overall  Glucose range:  93   86-182   86-322   Mean/median:     148   POST-MEAL PC Breakfast PC Lunch PC Dinner  Glucose range:   138-322   Mean/median:       Dietician visit: Most recent: 1997 at diagnosis.  .   Wt Readings from Last 3 Encounters:  10/20/20 159 lb (72.1 kg)  07/21/20 158 lb 3.2 oz (71.8 kg)  07/06/20 155 lb (70.3 kg)    Lab Results  Component Value Date   HGBA1C 6.7 (H) 10/17/2020   HGBA1C 7.0 (H) 07/19/2020   HGBA1C 5.4 04/21/2020   Lab Results  Component Value Date   MICROALBUR 2.7 (H) 10/17/2020   LDLCALC 97 10/17/2020   CREATININE 1.14 10/17/2020    OTHER active problems: See review of systems   Lab on 10/17/2020  Component Date Value Ref Range Status   Cholesterol 10/17/2020 164  0 - 200 mg/dL Final   ATP III Classification       Desirable:  < 200 mg/dL               Borderline High:  200 - 239 mg/dL          High:  > = 240 mg/dL   Triglycerides 10/17/2020 72.0  0.0 - 149.0 mg/dL Final   Normal:  <150 mg/dLBorderline High:  150 - 199 mg/dL   HDL 10/17/2020 52.80  >39.00 mg/dL Final   VLDL 10/17/2020 14.4  0.0 - 40.0 mg/dL Final   LDL Cholesterol 10/17/2020 97  0 - 99 mg/dL Final   Total CHOL/HDL Ratio 10/17/2020 3   Final                  Men          Women1/2 Average Risk     3.4          3.3Average Risk          5.0          4.42X Average Risk          9.6          7.13X Average Risk          15.0          11.0                       NonHDL 10/17/2020 111.61   Final   NOTE:  Non-HDL goal should be 30 mg/dL higher than patient's LDL goal (i.e. LDL goal of < 70 mg/dL, would have non-HDL goal of < 100 mg/dL)   Microalb, Ur 10/17/2020 2.7 (A) 0.0 - 1.9 mg/dL Final   Creatinine,U 10/17/2020 193.1  mg/dL Final   Microalb Creat Ratio 10/17/2020 1.4  0.0 - 30.0 mg/g Final   Sodium 10/17/2020 140  135 - 145 mEq/L Final   Potassium 10/17/2020 4.2  3.5 - 5.1 mEq/L Final   Chloride 10/17/2020 107  96 - 112 mEq/L Final   CO2 10/17/2020 27  19 - 32 mEq/L Final   Glucose, Bld 10/17/2020 179 (A) 70 -  99 mg/dL Final   BUN 10/17/2020 23  6 - 23 mg/dL Final   Creatinine, Ser 10/17/2020 1.14  0.40 - 1.50 mg/dL Final   Total Bilirubin 10/17/2020 0.6  0.2 - 1.2 mg/dL Final   Alkaline Phosphatase 10/17/2020 71  39 - 117 U/L Final   AST 10/17/2020 15  0 - 37 U/L Final   ALT 10/17/2020 16  0 - 53 U/L Final   Total Protein 10/17/2020 6.7  6.0 - 8.3 g/dL Final   Albumin 10/17/2020 3.9  3.5 - 5.2 g/dL Final   GFR 10/17/2020 63.88  >60.00 mL/min Final   Calculated using the CKD-EPI Creatinine Equation (2021)   Calcium 10/17/2020 9.2  8.4 - 10.5 mg/dL Final   Hgb A1c MFr Bld 10/17/2020 6.7 (A) 4.6 - 6.5 % Final   Glycemic Control Guidelines for People with Diabetes:Non Diabetic:  <6%Goal of Therapy: <7%Additional Action Suggested:  >8%     Allergies as of 10/20/2020       Reactions   Metformin Diarrhea, Other (See Comments)        Medication List        Accurate as of October 20, 2020  1:44 PM. If you have any questions, ask your nurse or doctor.          Accu-Chek Guide test strip Generic drug: glucose blood Use Accu Chek Guide test strips as instructed to check blood sugar once daily.   Accu-Chek Guide test strip Generic drug: glucose blood USE TO CHECK BLOOD SUGARS THREE TIMES DAILY.   Accu-Chek Guide w/Device Kit 1 each by Does not apply route daily. Use accu chek guide meter to check blood sugar once daily.   Accu-Chek Softclix Lancets lancets 3 times daily As directed DXE11.65   alfuzosin 10 MG 24 hr tablet Commonly known as: UROXATRAL Take 10 mg by mouth daily.  Alogliptin-Pioglitazone 25-30 MG Tabs TAKE 1 TABLET DAILY   ALPRAZolam 0.5 MG tablet Commonly known as: XANAX Take 0.5 mg by mouth every other day as needed. Take 1/2 tablet by mouth every other day as needed.   atorvastatin 80 MG tablet Commonly known as: LIPITOR TAKE 1 TABLET DAILY   finasteride 5 MG tablet Commonly known as: PROSCAR   glimepiride 4 MG tablet Commonly known as: AMARYL TAKE 1  TABLET BY MOUTH TWICE A DAY BEFORE A MEAL   hydrocortisone-pramoxine 2.5-1 % rectal cream Commonly known as: ANALPRAM-HC   mometasone 50 MCG/ACT nasal spray Commonly known as: NASONEX USE 2 SPRAYS IN EACH NOSTRIL NASALLY ONCE A DAY AS NEEDED   omeprazole 40 MG capsule Commonly known as: PRILOSEC as needed.   Rybelsus 3 MG Tabs Generic drug: Semaglutide Take 3 mg by mouth daily before breakfast.   tadalafil 5 MG tablet Commonly known as: CIALIS Take 5 mg by mouth daily as needed for erectile dysfunction.        Allergies:  Allergies  Allergen Reactions   Metformin Diarrhea and Other (See Comments)    Past Medical History:  Diagnosis Date   Arthritis    Diabetes mellitus without complication ([place])    Hypercholesterolemia    Hypertension     Past Surgical History:  Procedure Laterality Date   LAPARASCOPIC MEDICAN ARCUATE LIGAMENT RELEASE     MINOR HEMORRHOIDECTOMY N/A     Family History  Problem Relation Age of Onset   Cancer Neg Hx     Social History:  reports that he has quit smoking. His smoking use included cigarettes. He has never used smokeless tobacco. He reports current alcohol use. He reports that he does not use drugs.  Review of Systems:    HYPERLIPIDEMIA: Followed by PCP Has been taking 80 mg Lipitor and LDL as follows  He does not think he has Lipitor is causing any joint or muscle pains LDL is higher and he believes this is from inconsistent diet such as eating more butter  LDL had been 94 in June 21  Lab Results  Component Value Date   CHOL 164 10/17/2020   CHOL 170 07/19/2020   CHOL 185 02/01/2020   Lab Results  Component Value Date   HDL 52.80 10/17/2020   HDL 56.60 07/19/2020   HDL 58.30 02/01/2020   Lab Results  Component Value Date   LDLCALC 97 10/17/2020   LDLCALC 103 (H) 07/19/2020   LDLCALC 119 (H) 02/01/2020   Lab Results  Component Value Date   TRIG 72.0 10/17/2020   TRIG 52.0 07/19/2020   TRIG 42.0 02/01/2020    Lab Results  Component Value Date   CHOLHDL 3 10/17/2020   CHOLHDL 3 07/19/2020   CHOLHDL 3 02/01/2020   Lab Results  Component Value Date   LDLDIRECT 134.6 11/18/2013      Diabetic foot exam in 6/21 showed normal monofilament sensation in the toes and plantar surfaces, no skin lesions or ulcers on the feet and normal pedal pulses  No history of hypertension or microalbuminuria Blood pressure is variable 6  BP Readings from Last 3 Encounters:  10/20/20 (!) 142/70  07/21/20 120/70  07/06/20 (!) 136/108   His annual eye exam was in 3/21 and was normal   Examination:   BP (!) 142/70   Pulse 68   Ht 5' 8.5" (1.74 m)   Wt 159 lb (72.1 kg)   SpO2 97%   BMI 23.82 kg/m   Body mass index  is 23.82 kg/m.      Assesment/Plan:   Diabetes type 2 without significant obesity  See history of present illness for detailed discussion of current diabetes management, blood sugar patterns and problems identified  His A1c is now 0.7 and slightly improved  Generally his A1c is slightly lower than expected for his blood sugars As before he has difficulty with consistently controlling carbohydrates especially at breakfast that may raise his blood sugar Today could not assess his blood sugars subjectively since he did not have a downloadable functioning meter Fasting readings are usually fairly good Is doing well with the exercise regimen  He previously did try Rybelsus and was not clear whether he had decreased appetite and weight loss with this  Since he still has some capsules at home he is willing to try this again He will take 3 mg daily along with the previously prescribed medications Reminded him to take this 30 minutes before breakfast or coffee He will try to consistently controlled carbohydrates, this may also be easier to do with Rybelsus  He will let us know in a couple of weeks if Rybelsus and working without side effects and then proceed with a prescription Also  discussed general benefits of GLP-1 drugs especially in the long run with some cardiovascular benefits and other modalities of action on diabetes control  Hypercholesterolemia: His LDL is improved and below 100 and he can continue Lipitor along  Microalbumin normal  Blood pressure needs to be followed and addressed if consistently high  There are no Patient Instructions on file for this visit.   Elayne Snare 10/20/2020, 1:44 PM

## 2020-10-27 ENCOUNTER — Other Ambulatory Visit: Payer: Self-pay | Admitting: Endocrinology

## 2020-11-04 ENCOUNTER — Other Ambulatory Visit: Payer: Self-pay | Admitting: Endocrinology

## 2020-12-16 ENCOUNTER — Telehealth: Payer: Self-pay | Admitting: Endocrinology

## 2020-12-16 DIAGNOSIS — E1165 Type 2 diabetes mellitus with hyperglycemia: Secondary | ICD-10-CM

## 2020-12-16 MED ORDER — GLIMEPIRIDE 4 MG PO TABS: ORAL_TABLET | ORAL | 1 refills | Status: DC

## 2020-12-16 NOTE — Telephone Encounter (Signed)
Is prescribed glimepiride (AMARYL) 4 MG tablet, despite the directions pt has been taking 2 a day and says he's been taking 2 a day for a long time (perhaps that old prescription was 2mg ). Pt is running low on this medication and needs a refill.   [id number] - [place], [place] - [address] AT [facility] [address]  [facility] [address], [place] [address]  Pt contact [phone]

## 2021-01-12 ENCOUNTER — Encounter: Payer: Self-pay | Admitting: Endocrinology

## 2021-01-12 DIAGNOSIS — E1165 Type 2 diabetes mellitus with hyperglycemia: Secondary | ICD-10-CM

## 2021-01-12 MED ORDER — ACCU-CHEK GUIDE W/DEVICE KIT: 1.0000 | PACK | Freq: Every day | 0 refills | Status: DC

## 2021-01-24 ENCOUNTER — Other Ambulatory Visit: Payer: Federal, State, Local not specified - PPO

## 2021-01-25 ENCOUNTER — Other Ambulatory Visit: Payer: Self-pay

## 2021-01-25 ENCOUNTER — Other Ambulatory Visit (INDEPENDENT_AMBULATORY_CARE_PROVIDER_SITE_OTHER): Payer: Federal, State, Local not specified - PPO

## 2021-01-25 DIAGNOSIS — E1165 Type 2 diabetes mellitus with hyperglycemia: Secondary | ICD-10-CM

## 2021-01-25 LAB — BASIC METABOLIC PANEL
BUN: 19 mg/dL (ref 6–23)
CO2: 27 mEq/L (ref 19–32)
Calcium: 8.7 mg/dL (ref 8.4–10.5)
Chloride: 107 mEq/L (ref 96–112)
Creatinine, Ser: 1.02 mg/dL (ref 0.40–1.50)
GFR: 72.87 mL/min (ref >60.00)
Glucose, Bld: 147 mg/dL — ABNORMAL HIGH (ref 70–99)
Potassium: 3.8 mEq/L (ref 3.5–5.1)
Sodium: 140 mEq/L (ref 135–145)

## 2021-01-25 LAB — HEMOGLOBIN A1C: Hgb A1c MFr Bld: 6.7 % — ABNORMAL HIGH (ref 4.6–6.5)

## 2021-01-27 ENCOUNTER — Ambulatory Visit (INDEPENDENT_AMBULATORY_CARE_PROVIDER_SITE_OTHER): Payer: Federal, State, Local not specified - PPO | Admitting: Endocrinology

## 2021-01-27 ENCOUNTER — Encounter: Payer: Self-pay | Admitting: Endocrinology

## 2021-01-27 ENCOUNTER — Other Ambulatory Visit: Payer: Self-pay

## 2021-01-27 VITALS — BP 130/64 | HR 78 | Ht 69.0 in | Wt 167.2 lb

## 2021-01-27 DIAGNOSIS — R030 Elevated blood-pressure reading, without diagnosis of hypertension: Secondary | ICD-10-CM

## 2021-01-27 DIAGNOSIS — E1165 Type 2 diabetes mellitus with hyperglycemia: Secondary | ICD-10-CM

## 2021-01-27 MED ORDER — RYBELSUS 7 MG PO TABS: 1.0000 | ORAL_TABLET | Freq: Every day | ORAL | 3 refills | Status: DC

## 2021-01-27 MED ORDER — RYBELSUS 3 MG PO TABS: 3.0000 mg | ORAL_TABLET | Freq: Every day | ORAL | 0 refills | Status: DC

## 2021-01-27 NOTE — Patient Instructions (Signed)
With Rybelsus reduce Glimeperide at lunch to 1/2

## 2021-01-27 NOTE — Progress Notes (Signed)
Patient ID: Kenneth Lawson, male   DOB: 03-13-1945, 75 y.o.   MRN: [id number]   Reason for Appointment: Diabetes follow-up    History of Present Illness    Diagnosis: date of diagnosis: 1997.   Previous history: He was previously treated with glyburide, Actos and metformin.  Metformin was stopped because he had GI side effects  His blood sugars had been relatively high in the last 3-4 years and A1c had been consistently over 7.5% until his initial consultation He was started on Invokana in 10/13 which had helped modestly. His blood sugars had shown considerable improvement with  adding  Nesina since 2/14 .    He was also on Actos which he had stopped but blood sugars did not appear to worsen. .  The previous HbgA1c levels: 6.4 in 4/14,  7.4 in 12/13, prior range from 7.8-8.3 since 2011   RECENT history:   Oral hypoglycemic drugs: Oseni 25/30,   Amaryl 4 mg at breakfast and lunch  A1c is at 6.7%, previously same   Current blood sugar patterns, management and problems identified: His A1c is stable but his blood sugars overall at home are higher  He was told in August to try the Rybelsus that he had at home and then call us if he tolerated this well but he was apparently told that there was no refill on this He is not clear whether his blood sugars were better with Rybelsus Although he thinks that his blood sugars are usually high after breakfast (very small amount of food he still has high readings in the evenings after dinner fairly consistently Currently taking maximum dose Amaryl However has had 1 reading of 65 at bedtime otherwise no low sugars Fasting readings are mostly fairly good although 147 in the lab At times will go off his diet with more carbohydrates and not watching what he is eating over the holidays  Recently has not checked many readings after supper He appears to have had some weight gain of about 8 pounds He is periodically trying to walk and also go  to the gym 2 or 3 days a week although not in the last couple of weeks   Dinner usually around 8 pm   Side effects from medications: bloating, gas and GI distress from metformin, even 500 mg, balanitis from Invokana and Jardiance  Monitors blood glucose: Up to 3x daily  Glucometer: ACCU-CHEK.    PRE-MEAL Fasting Lunch Dinner Bedtime Overall  Glucose range:       Mean/median: 133  124 121 163   POST-MEAL PC Breakfast PC Lunch PC Dinner  Glucose range:     Mean/median: ?  192   240     Dietician visit: Most recent: 1997 at diagnosis.  .   Wt Readings from Last 3 Encounters:  01/27/21 167 lb 3.2 oz (75.8 kg)  10/20/20 159 lb (72.1 kg)  07/21/20 158 lb 3.2 oz (71.8 kg)    Lab Results  Component Value Date   HGBA1C 6.7 (H) 01/25/2021   HGBA1C 6.7 (H) 10/17/2020   HGBA1C 7.0 (H) 07/19/2020   Lab Results  Component Value Date   MICROALBUR 2.7 (H) 10/17/2020   LDLCALC 97 10/17/2020   CREATININE 1.02 01/25/2021    OTHER active problems: See review of systems   Lab on 01/25/2021  Component Date Value Ref Range Status   Sodium 01/25/2021 140  135 - 145 mEq/L Final   Potassium 01/25/2021 3.8  3.5 - 5.1 mEq/L Final  Chloride 01/25/2021 107  96 - 112 mEq/L Final   CO2 01/25/2021 27  19 - 32 mEq/L Final   Glucose, Bld 01/25/2021 147 (H)  70 - 99 mg/dL Final   BUN 01/25/2021 19  6 - 23 mg/dL Final   Creatinine, Ser 01/25/2021 1.02  0.40 - 1.50 mg/dL Final   GFR 01/25/2021 72.87  >60.00 mL/min Final   Calculated using the CKD-EPI Creatinine Equation (2021)   Calcium 01/25/2021 8.7  8.4 - 10.5 mg/dL Final   Hgb A1c MFr Bld 01/25/2021 6.7 (H)  4.6 - 6.5 % Final   Glycemic Control Guidelines for People with Diabetes:Non Diabetic:  <6%Goal of Therapy: <7%Additional Action Suggested:  >8%     Allergies as of 01/27/2021       Reactions   Metformin Diarrhea, Other (See Comments)        Medication List        Accurate as of January 27, 2021 12:54 PM. If you have any  questions, ask your nurse or doctor.          Accu-Chek Guide test strip Generic drug: glucose blood Use Accu Chek Guide test strips as instructed to check blood sugar once daily.   Accu-Chek Guide test strip Generic drug: glucose blood USE TO CHECK BLOOD SUGARS THREE TIMES DAILY.   Accu-Chek Guide w/Device Kit 1 each by Does not apply route daily. Use accu chek guide meter to check blood sugar once daily.   Accu-Chek Softclix Lancets lancets 3 times daily As directed DXE11.65   alfuzosin 10 MG 24 hr tablet Commonly known as: UROXATRAL Take 10 mg by mouth daily.   Alogliptin-Pioglitazone 25-30 MG Tabs TAKE 1 TABLET DAILY   ALPRAZolam 0.5 MG tablet Commonly known as: XANAX Take 0.5 mg by mouth every other day as needed. Take 1/2 tablet by mouth every other day as needed.   atorvastatin 80 MG tablet Commonly known as: LIPITOR TAKE 1 TABLET DAILY   buPROPion 300 MG 24 hr tablet Commonly known as: WELLBUTRIN XL Indications: anxiousness associated with depression. Take 1 tablet by mouth every day   finasteride 5 MG tablet Commonly known as: PROSCAR   glimepiride 4 MG tablet Commonly known as: AMARYL Take 1 tablet with breakfast and 1 tablet with lunch   hydrocortisone-pramoxine 2.5-1 % rectal cream Commonly known as: ANALPRAM-HC   mometasone 50 MCG/ACT nasal spray Commonly known as: NASONEX USE 2 SPRAYS IN EACH NOSTRIL NASALLY ONCE A DAY AS NEEDED   omeprazole 40 MG capsule Commonly known as: PRILOSEC as needed.   Rybelsus 3 MG Tabs Generic drug: Semaglutide Take 3 mg by mouth daily before breakfast. What changed: Another medication with the same name was added. Make sure you understand how and when to take each. Changed by: Elayne Snare, MD   Rybelsus 7 MG Tabs Generic drug: Semaglutide Take 1 tablet by mouth daily before breakfast. With half glass water.  Start after 3 mg runs out What changed: You were already taking a medication with the same name, and  this prescription was added. Make sure you understand how and when to take each. Changed by: Elayne Snare, MD   tadalafil 5 MG tablet Commonly known as: CIALIS Take 5 mg by mouth daily as needed for erectile dysfunction.        Allergies:  Allergies  Allergen Reactions   Metformin Diarrhea and Other (See Comments)    Past Medical History:  Diagnosis Date   Arthritis    Diabetes mellitus without complication ([place])  Hypercholesterolemia    Hypertension     Past Surgical History:  Procedure Laterality Date   LAPARASCOPIC MEDICAN ARCUATE LIGAMENT RELEASE     MINOR HEMORRHOIDECTOMY N/A     Family History  Problem Relation Age of Onset   Cancer Neg Hx     Social History:  reports that he has quit smoking. His smoking use included cigarettes. He has never used smokeless tobacco. He reports current alcohol use. He reports that he does not use drugs.  Review of Systems:    HYPERLIPIDEMIA: Followed by PCP No history of CAD Has been taking 80 mg Lipitor and LDL as follows  He does not think he has Lipitor is causing any joint or muscle pains   Lab Results  Component Value Date   CHOL 164 10/17/2020   CHOL 170 07/19/2020   CHOL 185 02/01/2020   Lab Results  Component Value Date   HDL 52.80 10/17/2020   HDL 56.60 07/19/2020   HDL 58.30 02/01/2020   Lab Results  Component Value Date   LDLCALC 97 10/17/2020   LDLCALC 103 (H) 07/19/2020   LDLCALC 119 (H) 02/01/2020   Lab Results  Component Value Date   TRIG 72.0 10/17/2020   TRIG 52.0 07/19/2020   TRIG 42.0 02/01/2020   Lab Results  Component Value Date   CHOLHDL 3 10/17/2020   CHOLHDL 3 07/19/2020   CHOLHDL 3 02/01/2020   Lab Results  Component Value Date   LDLDIRECT 134.6 11/18/2013      Diabetic foot exam in 6/21 showed normal monofilament sensation in the toes and plantar surfaces, no skin lesions or ulcers on the feet and normal pedal pulses  No history of hypertension or  microalbuminuria   BP Readings from Last 3 Encounters:  01/27/21 130/64  10/20/20 (!) 142/70  07/21/20 120/70   His annual eye exam was in 3/21 and was normal   Examination:   BP 130/64   Pulse 78   Ht '5\' 9"'  (1.753 m)   Wt 167 lb 3.2 oz (75.8 kg)   SpO2 95%   BMI 24.69 kg/m   Body mass index is 24.69 kg/m.      Assesment/Plan:   Diabetes type 2 without significant obesity  See history of present illness for detailed discussion of current diabetes management, blood sugar patterns and problems identified  His A1c is again 6.7 and slightly improved  Generally his A1c is slightly lower than expected for his blood sugars  He does need to try Rybelsus which previously he had tried only 3 mg which recently did not cause decreased appetite or weight loss Discussed his postprandial readings are likely going to be better especially if he is able to go up to 7 mg daily Again with recent weight gain and increased carbohydrate intake with inconsistent diet and exercise he likely has higher sugars also with readings over 200 at times in the evening  Discussed how Rybelsus works and timing of taking the medication He will start with 3 mg for 30 days and if no adverse effect go up to 7 mg, both prescriptions sent to the pharmacy With Rybelsus likely needs to at least reduce lunchtime dose of Amaryl to half a tablet as a precaution Continue alogliptin/pioglitazone combination He will try to be more consistent with his exercise Overall needs to watch carbohydrates consistently Continue to monitor blood sugars at various times and more after dinner also  HYPERTENSION: Blood pressure is improved  Patient Instructions  With Rybelsus reduce Glimeperide  at lunch to 1/2   Elayne Snare 01/27/2021, 12:54 PM

## 2021-03-24 ENCOUNTER — Other Ambulatory Visit (INDEPENDENT_AMBULATORY_CARE_PROVIDER_SITE_OTHER): Payer: Federal, State, Local not specified - PPO

## 2021-03-24 ENCOUNTER — Other Ambulatory Visit: Payer: Self-pay

## 2021-03-24 DIAGNOSIS — E1165 Type 2 diabetes mellitus with hyperglycemia: Secondary | ICD-10-CM

## 2021-03-24 LAB — GLUCOSE, RANDOM: Glucose, Bld: 163 mg/dL — ABNORMAL HIGH (ref 70–99)

## 2021-03-25 LAB — FRUCTOSAMINE: Fructosamine: 263 umol/L (ref 0–285)

## 2021-03-31 ENCOUNTER — Encounter: Payer: Self-pay | Admitting: Endocrinology

## 2021-03-31 ENCOUNTER — Ambulatory Visit: Payer: Federal, State, Local not specified - PPO | Admitting: Endocrinology

## 2021-03-31 ENCOUNTER — Other Ambulatory Visit: Payer: Self-pay

## 2021-03-31 VITALS — BP 140/80 | HR 76 | Ht 68.5 in | Wt 170.8 lb

## 2021-03-31 DIAGNOSIS — E1165 Type 2 diabetes mellitus with hyperglycemia: Secondary | ICD-10-CM | POA: Diagnosis not present

## 2021-03-31 NOTE — Patient Instructions (Signed)
Take 1/2 Amaryl at lunch if eating a meal, otherwise skip  Check blood sugars on waking up 3 days a week  Also check blood sugars about 2 hours after meals and do this after different meals by rotation  Recommended blood sugar levels on waking up are 90-130 and about 2 hours after meal is 130-160  Please bring your blood sugar monitor to each visit, thank you

## 2021-03-31 NOTE — Progress Notes (Signed)
Patient ID: Kenneth Lawson, male   DOB: 08/27/1945, 76 y.o.   MRN: [id number]   Reason for Appointment: Diabetes follow-up    History of Present Illness    Diagnosis: date of diagnosis: 1997.   Previous history: He was previously treated with glyburide, Actos and metformin.  Metformin was stopped because he had GI side effects  His blood sugars had been relatively high in the last 3-4 years and A1c had been consistently over 7.5% until his initial consultation He was started on Invokana in 10/13 which had helped modestly. His blood sugars had shown considerable improvement with  adding  Nesina since 2/14 .    He was also on Actos which he had stopped but blood sugars did not appear to worsen. .  The previous HbgA1c levels: 6.4 in 4/14,  7.4 in 12/13, prior range from 7.8-8.3 since 2011   RECENT history:   Oral hypoglycemic drugs: Oseni 25/30,   Amaryl 4 mg at breakfast and lunch, Rybelsus 7 mg daily  A1c is at 6.7%, previously  Fructosamine now 263   Current blood sugar patterns, management and problems identified:  He was restarted on the 3 mg Rybelsus about 2 months ago when his blood sugars overall were higher especially after meals  He started on 3 mg for 30 days and now is taking 7 mg  No nausea with this and is taking it with water only 30 minutes before eating Overall he thinks he is trying to control his carbohydrates better and is benefiting from taking Rybelsus  Although he has not done consistent readings after meals they do appear to be better with highest reading 178 after breakfast  Most of his sugars late afternoon are low normal but they have been low the last 2 or 3 days He says that if he is eating a relatively larger breakfast he may skip the lunch but still take his Amaryl midday Usually not getting any symptoms with low sugars His weight is still higher by 3 pounds but previously had gained more He is still trying to walk or go to the gym at least  2 or 3 days a week     Dinner usually around 8 pm   Side effects from medications: bloating, gas and GI distress from metformin, even 500 mg, balanitis from Invokana and Jardiance  Glucometer: ACCU-CHEK.    PRE-MEAL Fasting Lunch Dinner Bedtime Overall  Glucose range:   58-96 111   Mean/median:     118   POST-MEAL PC Breakfast PC Lunch PC Dinner  Glucose range: 121-178    Mean/median:      Previously  PRE-MEAL Fasting Lunch Dinner Bedtime Overall  Glucose range:       Mean/median: 133  124 121 163   POST-MEAL PC Breakfast PC Lunch PC Dinner  Glucose range:     Mean/median: ?  192   240     Dietician visit: Most recent: 1997 at diagnosis.  .   Wt Readings from Last 3 Encounters:  03/31/21 170 lb 12.8 oz (77.5 kg)  01/27/21 167 lb 3.2 oz (75.8 kg)  10/20/20 159 lb (72.1 kg)    Lab Results  Component Value Date   HGBA1C 6.7 (H) 01/25/2021   HGBA1C 6.7 (H) 10/17/2020   HGBA1C 7.0 (H) 07/19/2020   Lab Results  Component Value Date   MICROALBUR 2.7 (H) 10/17/2020   LDLCALC 97 10/17/2020   CREATININE 1.02 01/25/2021    OTHER active problems: See review of  systems   No visits with results within 1 Week(s) from this visit.  Latest known visit with results is:  Lab on 03/24/2021  Component Date Value Ref Range Status   Fructosamine 03/24/2021 263  0 - 285 umol/L Final   Comment: Published reference interval for apparently healthy subjects between age 60 and 4 is 61 - 285 umol/L and in a poorly controlled diabetic population is 228 - 563 umol/L with a mean of 396 umol/L.    Glucose, Bld 03/24/2021 163 (H)  70 - 99 mg/dL Final    Allergies as of 03/31/2021       Reactions   Metformin Diarrhea, Other (See Comments)        Medication List        Accurate as of March 31, 2021 10:59 AM. If you have any questions, ask your nurse or doctor.          Accu-Chek Guide test strip Generic drug: glucose blood Use Accu Chek Guide test strips as  instructed to check blood sugar once daily.   Accu-Chek Guide test strip Generic drug: glucose blood USE TO CHECK BLOOD SUGARS THREE TIMES DAILY.   Accu-Chek Guide w/Device Kit 1 each by Does not apply route daily. Use accu chek guide meter to check blood sugar once daily.   Accu-Chek Softclix Lancets lancets 3 times daily As directed DXE11.65   alfuzosin 10 MG 24 hr tablet Commonly known as: UROXATRAL Take 10 mg by mouth daily.   Alogliptin-Pioglitazone 25-30 MG Tabs TAKE 1 TABLET DAILY   ALPRAZolam 0.5 MG tablet Commonly known as: XANAX Take 0.5 mg by mouth every other day as needed. Take 1/2 tablet by mouth every other day as needed.   atorvastatin 80 MG tablet Commonly known as: LIPITOR TAKE 1 TABLET DAILY   buPROPion 300 MG 24 hr tablet Commonly known as: WELLBUTRIN XL Indications: anxiousness associated with depression. Take 1 tablet by mouth every day   finasteride 5 MG tablet Commonly known as: PROSCAR   glimepiride 4 MG tablet Commonly known as: AMARYL Take 1 tablet with breakfast and 1 tablet with lunch   hydrocortisone-pramoxine 2.5-1 % rectal cream Commonly known as: ANALPRAM-HC   mometasone 50 MCG/ACT nasal spray Commonly known as: NASONEX USE 2 SPRAYS IN EACH NOSTRIL NASALLY ONCE A DAY AS NEEDED   omeprazole 40 MG capsule Commonly known as: PRILOSEC as needed.   Rybelsus 3 MG Tabs Generic drug: Semaglutide Take 3 mg by mouth daily before breakfast.   Rybelsus 7 MG Tabs Generic drug: Semaglutide Take 1 tablet by mouth daily before breakfast. With half glass water.  Start after 3 mg runs out   tadalafil 5 MG tablet Commonly known as: CIALIS Take 5 mg by mouth daily as needed for erectile dysfunction.   Vilazodone HCl 20 MG Tabs Indications: major depressive disorder. Take 1 tablet by mouth every day        Allergies:  Allergies  Allergen Reactions   Metformin Diarrhea and Other (See Comments)    Past Medical History:  Diagnosis  Date   Arthritis    Diabetes mellitus without complication ([place])    Hypercholesterolemia    Hypertension     Past Surgical History:  Procedure Laterality Date   LAPARASCOPIC MEDICAN ARCUATE LIGAMENT RELEASE     MINOR HEMORRHOIDECTOMY N/A     Family History  Problem Relation Age of Onset   Cancer Neg Hx     Social History:  reports that he has quit smoking. His smoking  use included cigarettes. He has never used smokeless tobacco. He reports current alcohol use. He reports that he does not use drugs.  Review of Systems:    HYPERLIPIDEMIA: Followed by PCP No history of CAD Has been taking 80 mg Lipitor and LDL as follows  He does not think he has Lipitor is causing any joint or muscle pains   Lab Results  Component Value Date   CHOL 164 10/17/2020   CHOL 170 07/19/2020   CHOL 185 02/01/2020   Lab Results  Component Value Date   HDL 52.80 10/17/2020   HDL 56.60 07/19/2020   HDL 58.30 02/01/2020   Lab Results  Component Value Date   LDLCALC 97 10/17/2020   LDLCALC 103 (H) 07/19/2020   LDLCALC 119 (H) 02/01/2020   Lab Results  Component Value Date   TRIG 72.0 10/17/2020   TRIG 52.0 07/19/2020   TRIG 42.0 02/01/2020   Lab Results  Component Value Date   CHOLHDL 3 10/17/2020   CHOLHDL 3 07/19/2020   CHOLHDL 3 02/01/2020   Lab Results  Component Value Date   LDLDIRECT 134.6 11/18/2013      Diabetic foot exam in 6/21 showed normal monofilament sensation in the toes and plantar surfaces, no skin lesions or ulcers on the feet and normal pedal pulses  No history of hypertension or microalbuminuria   BP Readings from Last 3 Encounters:  03/31/21 140/80  01/27/21 130/64  10/20/20 (!) 142/70   His annual eye exam was in 3/21 and was normal   Examination:   BP 140/80    Pulse 76    Ht 5' 8.5" (1.74 m)    Wt 170 lb 12.8 oz (77.5 kg)    SpO2 98%    BMI 25.59 kg/m   Body mass index is 25.59 kg/m.      Assesment/Plan:   Diabetes type 2 without  significant obesity  See history of present illness for detailed discussion of current diabetes management, blood sugar patterns and problems identified  His A1c is last 6.7 and fructosamine is relatively good  Now taking Rybelsus with his oseni and Amaryl  Generally his A1c is slightly lower than expected for his blood sugars  He does seem to benefit from Rybelsus 7 mg which he is tolerating and this is not causing excessive anorexia or weight loss Postprandial readings are mildly increased at times but variable Also not checking enough readings after dinner or lunch  Recommendations: He will stop taking Amaryl at lunchtime if he is not planning to eat Otherwise he will take only half a tablet midday Check blood sugars by rotation at different times Consistent exercise regimen  HYPERTENSION: Blood pressure is relatively well controlled  Patient Instructions  Take 1/2 Amaryl at lunch if eating a meal, otherwise skip  Check blood sugars on waking up 3 days a week  Also check blood sugars about 2 hours after meals and do this after different meals by rotation  Recommended blood sugar levels on waking up are 90-130 and about 2 hours after meal is 130-160  Please bring your blood sugar monitor to each visit, thank you    Elayne Snare 03/31/2021, 10:59 AM

## 2021-04-03 ENCOUNTER — Encounter: Payer: Self-pay | Admitting: Endocrinology

## 2021-04-03 ENCOUNTER — Other Ambulatory Visit: Payer: Self-pay

## 2021-04-03 MED ORDER — RYBELSUS 7 MG PO TABS: 1.0000 | ORAL_TABLET | Freq: Every day | ORAL | 1 refills | Status: DC

## 2021-04-10 ENCOUNTER — Encounter: Payer: Self-pay | Admitting: Endocrinology

## 2021-04-11 ENCOUNTER — Other Ambulatory Visit: Payer: Self-pay

## 2021-04-11 MED ORDER — ACCU-CHEK SOFTCLIX LANCETS MISC: 3 refills | Status: AC

## 2021-04-26 ENCOUNTER — Ambulatory Visit (HOSPITAL_BASED_OUTPATIENT_CLINIC_OR_DEPARTMENT_OTHER): Payer: Federal, State, Local not specified - PPO | Attending: Orthopedic Surgery | Admitting: Physical Therapy

## 2021-04-26 ENCOUNTER — Other Ambulatory Visit: Payer: Self-pay

## 2021-04-26 DIAGNOSIS — M545 Low back pain, unspecified: Secondary | ICD-10-CM | POA: Insufficient documentation

## 2021-04-26 DIAGNOSIS — G8929 Other chronic pain: Secondary | ICD-10-CM | POA: Diagnosis present

## 2021-04-26 DIAGNOSIS — R2689 Other abnormalities of gait and mobility: Secondary | ICD-10-CM | POA: Diagnosis present

## 2021-04-26 NOTE — Therapy (Addendum)
?OUTPATIENT PHYSICAL THERAPY THORACOLUMBAR EVALUATION ? ? ?Patient Name: Kenneth Lawson ?MRN: [id number] ?DOB:[birth year], 76 y.o., male ?Today's Date: 04/28/2021 ? ? PT End of Session - 04/28/21 0211   ? ? Number of Visits 12   ? Date for PT Re-Evaluation 06/07/21   ? Authorization Type BCBS 75 visit limit   ? PT Start Time 1430   ? PT Stop Time 1515   ? PT Time Calculation (min) 45 min   ? Activity Tolerance Patient tolerated treatment well   ? Behavior During Therapy [facility] for tasks assessed/performed   ? ?  ?  ? ?  ? ? ?Past Medical History:  ?Diagnosis Date  ? Arthritis   ? Diabetes mellitus without complication (HCC)   ? Hypercholesterolemia   ? Hypertension   ? ?Past Surgical History:  ?Procedure Laterality Date  ? LAPARASCOPIC MEDICAN ARCUATE LIGAMENT RELEASE    ? MINOR HEMORRHOIDECTOMY N/A   ? ?Patient Active Problem List  ? Diagnosis Date Noted  ? Chronic low back pain 12/17/2017  ? Depression 03/21/2013  ? Type II or unspecified type diabetes mellitus without mention of complication, uncontrolled 09/15/2012  ? Pure hypercholesterolemia 09/15/2012  ? BPH (benign prostatic hypertrophy) 09/15/2012  ? ? ?PCP: Clovis Riley, L.August Saucer, MD ? ?REFERRING PROVIDER: Venita Lick, MD ? ?REFERRING DIAG: Low Back Pain  ? ?THERAPY DIAG:  ?Chronic bilateral low back pain without sciatica ? ?Other abnormalities of gait and mobility ? ?ONSET DATE: increasing pain since 2020  ? ?SUBJECTIVE:                                                                                                                                                                                          ? ?SUBJECTIVE STATEMENT: ?Patient has a long history of lumbar problems. He had a micro-dysectomy in 2004. He did well for a while but now he is back to has began to have pain again. He has pain into his legs standing and walking. When it is hurting he has difficulty even standing. He was very active prior to onset of pain. He is a Engineer, maintenance.  ? ?PERTINENT  HISTORY:  ?DMII; lumbar, cervical spine  ? ? ?PAIN:  ?Are you having pain? Yes ?NPRS scale: 0/10 currently at worst 3-4/10  ?Pain location:  ?Pain orientation: Bilateral  ?PAIN TYPE: aching ?Pain description: intermittent  ?Aggravating factors: Standing and walking  ?Relieving factors:  ? ?PRECAUTIONS: None ? ?WEIGHT BEARING RESTRICTIONS No ? ?FALLS:  ?Has patient fallen in last 6 months? No, Number of falls:  ? ?LIVING ENVIRONMENT: ? ?OCCUPATION: retired  ? ?PLOF: Independent ? ?PATIENT GOALS  ?To get back to  active lifestyle/ to be able to walk and go to the gym/ long term goal to run  ? ? ?OBJECTIVE:  ? ?DIAGNOSTIC FINDINGS:  ?MRI 2020 Lumbar: marked disc space narrowing Bilateral Si nerve root contact.  ? ?PATIENT SURVEYS:  ?FOTO   ? ?SCREENING FOR RED FLAGS: ?Bowel or bladder incontinence: No ?Spinal tumors: No ?Cauda equina syndrome: No ?Compression fracture: No ?Abdominal aneurysm: No ? ?COGNITION: ? Overall cognitive status: Within functional limits for tasks assessed   ?  ?SENSATION: ? Light touch: Appears intact ? Stereognosis: Appears intact ? Hot/Cold: Appears intact ? Proprioception: Appears intact ? ?MUSCLE LENGTH: ?Hamstrings: Right 90/90 35 from straight  deg; Left 90/90 35 from straight deg ? ?POSTURE:  ?Left shoulder elevation  ? ?PALPATION: ?No significant spasming  ? ?LUMBARAROM/PROM ? ?A/PROM A/PROM  ?04/28/2021  ?Flexion 90 mild pulling   ?Extension   ?Right lateral flexion   ?Left lateral flexion   ?Right rotation   ?Left rotation   ? (Blank rows = not tested) ? ?LE AROM/PROM: ? ?MMT Right ?04/28/2021 Left ?04/28/2021  ?Hip flexion 21.4 29.7  ?Hip extension    ?Hip abduction 47.8 41.8  ?Hip adduction    ?Hip internal rotation    ?Hip external rotation    ?Knee flexion    ?Knee extension 51.4 43.0  ?Ankle dorsiflexion    ?Ankle plantarflexion    ?Ankle inversion    ?Ankle eversion    ? (Blank rows = not tested) ? ? ? ?PROM  Right ?04/28/2021 Left ?04/28/2021  ?Hip flexion WNL WNL  ?Hip extension    ?Hip  abduction    ?Hip adduction    ?Hip internal rotation Only to neutral  Only to neutral   ?Hip external rotation WNL WNL  ?Knee flexion    ?Knee extension    ?Ankle dorsiflexion    ?Ankle plantarflexion    ?Ankle inversion    ?Ankle eversion    ? (Blank rows = not tested) ? ? ?GAIT: ?Mild lateral movement bilateral  ? ?TODAY'S TREATMENT  ? ?Exercises ?Seated Hamstring Stretch 3 reps - 20 hold ?Supine Piriformis Stretch with Foot on Ground 3 reps - 20 hold ?Supine March - 10 reps ?Hooklying Clamshell with Resistance 10 reps ? ? ?PATIENT EDUCATION:  ?Education details: reviewed HEP, symptom management, and progression of activity  ?Person educated: Patient ?Education method: Explanation, Demonstration, Tactile cues, and Verbal cues ?Education comprehension: verbalized understanding, returned demonstration, verbal cues required, tactile cues required, and needs further education ? ? ?HOME EXERCISE PROGRAM: ?Access Code: [email or web address] ?URL: [email or web address] ?Date: 04/26/2021 ?Prepared by: Lorayne Bender ? ?Exercises ?Seated Hamstring Stretch - 1 x daily - 7 x weekly - 3 sets - 3 reps - 20 hold ?Supine Piriformis Stretch with Foot on Ground - 1 x daily - 7 x weekly - 3 sets - 3 reps - 20 hold ?Supine March - 1 x daily - 7 x weekly - 3 sets - 10 reps ?Hooklying Clamshell with Resistance - 1 x daily - 7 x weekly - 3 sets - 10 reps ? ? ?ASSESSMENT: ? ?CLINICAL IMPRESSION: ?Patient is a 76 y.o. male who was seen today for physical therapy evaluation and treatment for Low Back Pain . He has mild limitations in motion and strength. He has mild tenderness to palpation in the lumbar spine. His ability to stand is limited. He is a member of the gym. We will rpogress him into a gym and pool program. He would like to improve his ability to ambulate distances.  ? ? ?  OBJECTIVE IMPAIRMENTS Abnormal gait, decreased activity tolerance, decreased endurance, difficulty walking, decreased ROM, decreased strength, increased  muscle spasms, and pain.  ? ?ACTIVITY LIMITATIONS community activity, driving, shopping, and yard work.  ? ?PERSONAL FACTORS 1-2 comorbidities: DMII; Arthritis of cervical spine   are also affecting patient's functional outcome.  ? ? ?REHAB POTENTIAL: Good ? ?CLINICAL DECISION MAKING: Evolving/moderate complexity declining general mobility  ? ?EVALUATION COMPLEXITY: Moderate ? ? ?GOALS: ?Goals reviewed with patient? No ? ?SHORT TERM GOALS: ? ?STG Name Target Date Goal status  ?1 Patient will demonstrate equal strength L V R in his LE  ?Baseline:  05/19/2021 INITIAL  ?2 Patient will demonstrate full bilateral IR  ?Baseline:  05/19/2021 INITIAL  ?3 Patient will be independent with basic HEP  ?Baseline: 05/19/2021 INITIAL  ?LONG TERM GOALS:  ? ?LTG Name Target Date Goal status  ?1 Patient will return to walking for exercise  ?Baseline: 06/09/2021 INITIAL  ?2 Patient will be independent with full gym program  ?Baseline: 06/09/2021 INITIAL  ?3 Patient will ambulate 2 miles without increased lower back pain  ?Baseline: 06/09/2021 INITIAL  ?PLAN: ?PT FREQUENCY: 2x/week ? ?PT DURATION: 6 weeks ? ?PLANNED INTERVENTIONS: Therapeutic exercises, Therapeutic activity, Neuromuscular re-education, Gait training, Patient/Family education, Joint mobilization, Dry Needling, Electrical stimulation, Spinal manipulation, Spinal mobilization, Cryotherapy, Moist heat, Taping, and Manual therapy ? ?PLAN FOR NEXT SESSION: on land progress supine exercises; consider standing scapular exercises; review stretches; progress to gym exercises as tolerated.  ? ? ?Dessie Coma, PT ?04/28/2021, 6:22 AM  ?

## 2021-04-28 ENCOUNTER — Encounter (HOSPITAL_BASED_OUTPATIENT_CLINIC_OR_DEPARTMENT_OTHER): Payer: Self-pay | Admitting: Physical Therapy

## 2021-05-01 ENCOUNTER — Other Ambulatory Visit: Payer: Self-pay

## 2021-05-01 ENCOUNTER — Encounter (HOSPITAL_BASED_OUTPATIENT_CLINIC_OR_DEPARTMENT_OTHER): Payer: Self-pay | Admitting: Physical Therapy

## 2021-05-01 ENCOUNTER — Ambulatory Visit (HOSPITAL_BASED_OUTPATIENT_CLINIC_OR_DEPARTMENT_OTHER): Payer: Federal, State, Local not specified - PPO | Admitting: Physical Therapy

## 2021-05-01 DIAGNOSIS — M545 Low back pain, unspecified: Secondary | ICD-10-CM

## 2021-05-01 DIAGNOSIS — R2689 Other abnormalities of gait and mobility: Secondary | ICD-10-CM

## 2021-05-01 NOTE — Therapy (Signed)
?OUTPATIENT PHYSICAL THERAPY THORACOLUMBAR EVALUATION ? ? ?Patient Name: Kenneth Lawson ?MRN: [id number] ?DOB:1945-09-21, 76 y.o., male ?Today's Date: 05/01/2021 ? ? PT End of Session - 05/01/21 1355   ? ? Visit Number 2   ? Number of Visits 12   ? Date for PT Re-Evaluation 06/07/21   ? Authorization Type BCBS 75 visit limit   ? PT Start Time 1345   ? PT Stop Time 1428   ? PT Time Calculation (min) 43 min   ? Activity Tolerance Patient tolerated treatment well   ? Behavior During Therapy [facility] for tasks assessed/performed   ? ?  ?  ? ?  ? ? ?Past Medical History:  ?Diagnosis Date  ? Arthritis   ? Diabetes mellitus without complication (HCC)   ? Hypercholesterolemia   ? Hypertension   ? ?Past Surgical History:  ?Procedure Laterality Date  ? LAPARASCOPIC MEDICAN ARCUATE LIGAMENT RELEASE    ? MINOR HEMORRHOIDECTOMY N/A   ? ?Patient Active Problem List  ? Diagnosis Date Noted  ? Chronic low back pain 12/17/2017  ? Depression 03/21/2013  ? Type II or unspecified type diabetes mellitus without mention of complication, uncontrolled 09/15/2012  ? Pure hypercholesterolemia 09/15/2012  ? BPH (benign prostatic hypertrophy) 09/15/2012  ? ? ?PCP: Clovis Riley, L.August Saucer, MD ? ?REFERRING PROVIDER: Clovis Riley, L.August Saucer, MD ? ?REFERRING DIAG: Low Back Pain  ? ?THERAPY DIAG:  ?Chronic bilateral low back pain without sciatica ? ?Other abnormalities of gait and mobility ? ?ONSET DATE: increasing pain since 2020  ? ?SUBJECTIVE:                                                                                                                                                                                          ? ?SUBJECTIVE STATEMENT: ?Patient has a long history of lumbar problems. He had a micro-dysectomy in 2004. He did well for a while but now he is back to has began to have pain again. He has pain into his legs standing and walking. When it is hurting he has difficulty even standing. He was very active prior to onset of pain. He is a Administrator, [employer].  ? ?PERTINENT HISTORY:  ?DMII; lumbar, cervical spine  ? ? ?PAIN:  ?Are you having pain? Yes ?NPRS scale: 0/10 currently at worst 3-4/10  ?Pain location:  ?Pain orientation: Bilateral  ?PAIN TYPE: aching ?Pain description: intermittent  ?Aggravating factors: Standing and walking  ?Relieving factors:  ? ?PRECAUTIONS: None ? ?WEIGHT BEARING RESTRICTIONS No ? ?FALLS:  ?Has patient fallen in last 6 months? No, Number of falls:  ? ?LIVING ENVIRONMENT: ? ?OCCUPATION: retired  ? ?PLOF: Independent ? ?PATIENT  GOALS  ?To get back to active lifestyle/ to be able to walk and go to the gym/ long term goal to run  ? ? ?OBJECTIVE:  ? ?DIAGNOSTIC FINDINGS:  ?MRI 2020 Lumbar: marked disc space narrowing Bilateral Si nerve root contact.  ? ?PATIENT SURVEYS:  ?FOTO   ? ?SCREENING FOR RED FLAGS: ?Bowel or bladder incontinence: No ?Spinal tumors: No ?Cauda equina syndrome: No ?Compression fracture: No ?Abdominal aneurysm: No ? ?COGNITION: ? Overall cognitive status: Within functional limits for tasks assessed   ?  ?SENSATION: ? Light touch: Appears intact ? Stereognosis: Appears intact ? Hot/Cold: Appears intact ? Proprioception: Appears intact ? ?MUSCLE LENGTH: ?Hamstrings: Right 90/90 35 from straight  deg; Left 90/90 35 from straight deg ? ?POSTURE:  ?Left shoulder elevation  ? ?PALPATION: ?No significant spasming  ? ?LUMBARAROM/PROM ? ?A/PROM A/PROM  ?05/01/2021  ?Flexion 90 mild pulling   ?Extension   ?Right lateral flexion   ?Left lateral flexion   ?Right rotation   ?Left rotation   ? (Blank rows = not tested) ? ?LE AROM/PROM: ? ?MMT Right ?05/01/2021 Left ?05/01/2021  ?Hip flexion 21.4 29.7  ?Hip extension    ?Hip abduction 47.8 41.8  ?Hip adduction    ?Hip internal rotation    ?Hip external rotation    ?Knee flexion    ?Knee extension 51.4 43.0  ?Ankle dorsiflexion    ?Ankle plantarflexion    ?Ankle inversion    ?Ankle eversion    ? (Blank rows = not tested) ? ? ? ?PROM  Right ?05/01/2021 Left ?05/01/2021  ?Hip flexion WNL WNL   ?Hip extension    ?Hip abduction    ?Hip adduction    ?Hip internal rotation Only to neutral  Only to neutral   ?Hip external rotation WNL WNL  ?Knee flexion    ?Knee extension    ?Ankle dorsiflexion    ?Ankle plantarflexion    ?Ankle inversion    ?Ankle eversion    ? (Blank rows = not tested) ? ? ?GAIT: ?Mild lateral movement bilateral  ? ?TODAY'S TREATMENT  ? ?Exercises ?Seated Hamstring Stretch 3 reps - 20 hold ?Supine Piriformis Stretch with Foot on Ground 3 reps - 20 hold ?Supine March - 10 reps ?Hooklying Clamshell with Resistance 10 reps ? ? ?PATIENT EDUCATION:  ?Education details: updated HEP  ?Person educated: Patient ?Education method: Explanation, Demonstration, Tactile cues, and Verbal cues ?Education comprehension: verbalized understanding, returned demonstration, verbal cues required, tactile cues required, and needs further education ? ? ?HOME EXERCISE PROGRAM: ?Access Code: [email or web address] ?URL: [email or web address] ?Date: 04/26/2021 ?Prepared by: Lorayne Bender ? ?Exercises ?Seated Hamstring Stretch - 1 x daily - 7 x weekly - 3 sets - 3 reps - 20 hold ?Supine Piriformis Stretch with Foot on Ground - 1 x daily - 7 x weekly - 3 sets - 3 reps - 20 hold ?Supine March - 1 x daily - 7 x weekly - 3 sets - 10 reps ?Hooklying Clamshell with Resistance - 1 x daily - 7 x weekly - 3 sets - 10 reps ? ? ?ASSESSMENT: ? ?CLINICAL IMPRESSION: ?Patient tolerated treatment well. We worked on progressing HEP. He was given another supine exercises. He was also given 2 standing postural/core exercises. He had no significant increase in pain. He will require further cuing and education on breathing and posture with his home exercises. We will perform a land treatment next visit and a pool visit the visit after that. We will progress exercises as tolerated.  ? ? ? ?OBJECTIVE  IMPAIRMENTS Abnormal gait, decreased activity tolerance, decreased endurance, difficulty walking, decreased ROM, decreased strength, increased  muscle spasms, and pain.  ? ?ACTIVITY LIMITATIONS community activity, driving, shopping, and yard work.  ? ?PERSONAL FACTORS 1-2 comorbidities: DMII; Arthritis of cervical spine   are also affecting patient's functional outcome.  ? ? ?REHAB POTENTIAL: Good ? ?CLINICAL DECISION MAKING: Evolving/moderate complexity declining general mobility  ? ?EVALUATION COMPLEXITY: Moderate ? ? ?GOALS: ?Goals reviewed with patient? No ? ?SHORT TERM GOALS: ? ?STG Name Target Date Goal status  ?1 Patient will demonstrate equal strength L V R in his LE  ?Baseline:  05/22/2021 INITIAL  ?2 Patient will demonstrate full bilateral IR  ?Baseline:  05/22/2021 INITIAL  ?3 Patient will be independent with basic HEP  ?Baseline: 05/22/2021 INITIAL  ?LONG TERM GOALS:  ? ?LTG Name Target Date Goal status  ?1 Patient will return to walking for exercise  ?Baseline: 06/12/2021 INITIAL  ?2 Patient will be independent with full gym program  ?Baseline: 06/12/2021 INITIAL  ?3 Patient will ambulate 2 miles without increased lower back pain  ?Baseline: 06/12/2021 INITIAL  ?PLAN: ?PT FREQUENCY: 2x/week ? ?PT DURATION: 6 weeks ? ?PLANNED INTERVENTIONS: Therapeutic exercises, Therapeutic activity, Neuromuscular re-education, Gait training, Patient/Family education, Joint mobilization, Dry Needling, Electrical stimulation, Spinal manipulation, Spinal mobilization, Cryotherapy, Moist heat, Taping, and Manual therapy ? ?PLAN FOR NEXT SESSION: on land progress supine exercises; consider standing scapular exercises; review stretches; progress to gym exercises as tolerated.  ? ? ?Dessie Coma, PT ?05/01/2021, 3:12 PM  ?

## 2021-05-03 ENCOUNTER — Encounter: Payer: Self-pay | Admitting: Endocrinology

## 2021-05-03 ENCOUNTER — Encounter (HOSPITAL_BASED_OUTPATIENT_CLINIC_OR_DEPARTMENT_OTHER): Payer: Federal, State, Local not specified - PPO | Admitting: Physical Therapy

## 2021-05-03 DIAGNOSIS — E1165 Type 2 diabetes mellitus with hyperglycemia: Secondary | ICD-10-CM

## 2021-05-03 MED ORDER — ALOGLIPTIN-PIOGLITAZONE 25-30 MG PO TABS: 1.0000 | ORAL_TABLET | Freq: Every day | ORAL | 3 refills | Status: DC

## 2021-05-11 ENCOUNTER — Ambulatory Visit (HOSPITAL_BASED_OUTPATIENT_CLINIC_OR_DEPARTMENT_OTHER): Payer: Federal, State, Local not specified - PPO | Admitting: Physical Therapy

## 2021-05-11 ENCOUNTER — Encounter (HOSPITAL_BASED_OUTPATIENT_CLINIC_OR_DEPARTMENT_OTHER): Payer: Federal, State, Local not specified - PPO | Admitting: Physical Therapy

## 2021-05-11 ENCOUNTER — Other Ambulatory Visit: Payer: Self-pay

## 2021-05-11 ENCOUNTER — Encounter (HOSPITAL_BASED_OUTPATIENT_CLINIC_OR_DEPARTMENT_OTHER): Payer: Self-pay | Admitting: Physical Therapy

## 2021-05-11 DIAGNOSIS — M545 Low back pain, unspecified: Secondary | ICD-10-CM | POA: Diagnosis not present

## 2021-05-11 NOTE — Therapy (Signed)
?OUTPATIENT PHYSICAL THERAPY THORACOLUMBAR EVALUATION ? ? ?Patient Name: Kenneth Lawson ?MRN: [id number] ?DOB:September 06, 1945, 76 y.o., male ?Today's Date: 05/11/2021 ? ? PT End of Session - 05/11/21 1233   ? ? Visit Number 3   ? Number of Visits 12   ? Date for PT Re-Evaluation 06/07/21   ? Authorization Type BCBS 75 visit limit   ? PT Start Time 1230   ? PT Stop Time 1312   ? PT Time Calculation (min) 42 min   ? Activity Tolerance Patient tolerated treatment well   ? Behavior During Therapy [facility] for tasks assessed/performed   ? ?  ?  ? ?  ? ? ?Past Medical History:  ?Diagnosis Date  ? Arthritis   ? Diabetes mellitus without complication (HCC)   ? Hypercholesterolemia   ? Hypertension   ? ?Past Surgical History:  ?Procedure Laterality Date  ? LAPARASCOPIC MEDICAN ARCUATE LIGAMENT RELEASE    ? MINOR HEMORRHOIDECTOMY N/A   ? ?Patient Active Problem List  ? Diagnosis Date Noted  ? Chronic low back pain 12/17/2017  ? Depression 03/21/2013  ? Type II or unspecified type diabetes mellitus without mention of complication, uncontrolled 09/15/2012  ? Pure hypercholesterolemia 09/15/2012  ? BPH (benign prostatic hypertrophy) 09/15/2012  ? ? ?PCP: Clovis Riley, L.August Saucer, MD ? ?REFERRING PROVIDER: Clovis Riley, L.August Saucer, MD ? ?REFERRING DIAG: Low Back Pain  ? ?THERAPY DIAG:  ?Chronic bilateral low back pain without sciatica ? ?Other abnormalities of gait and mobility ? ?ONSET DATE: increasing pain since 2020  ? ?SUBJECTIVE:                                                                                                                                                                                          ? ?SUBJECTIVE STATEMENT: ?The patient reports no significant increase in pain but also no decrease as well. He is somewhat frustrated by his lack of progress. He also admits he has not been as consitent as he could be with his activity.  ? ?PERTINENT HISTORY:  ?DMII; lumbar, cervical spine  ? ? ?PAIN:  ?Are you having pain? Yes 3/16 ?NPRS  scale: 0/10 currently at worst 3-4/10  ?Pain location:  ?Pain orientation: Bilateral  ?PAIN TYPE: aching ?Pain description: intermittent  ?Aggravating factors: Standing and walking  ?Relieving factors:  ? ?PRECAUTIONS: None ? ?WEIGHT BEARING RESTRICTIONS No ? ?FALLS:  ?Has patient fallen in last 6 months? No, Number of falls:  ? ?LIVING ENVIRONMENT: ? ?OCCUPATION: retired  ? ?PLOF: Independent ? ?PATIENT GOALS  ?To get back to active lifestyle/ to be able to walk and go to the gym/ long term goal to run  ? ? ?  OBJECTIVE:  ? ? ?TODAY'S TREATMENT  ? ?3/16 ?Manual: trigger point release to right upper gluteal / lower lumbar spine. Reviewed self trigger point release with tennis ball and thera-cane.  ? ? ?Cables  ?Row 3x10 15lbs  ?Extension 3x10 10lbs  ?Chop x12 each sdie  ?Pallof press x5 bilateral  ? ? ?Exercises ?Seated Hamstring Stretch 3 reps - 20 hold ?Supine Piriformis Stretch with Foot on Ground 3 reps - 20 hold ?Supine March - 10 reps ?Hooklying Clamshell with Resistance 10 reps ? ? ?PATIENT EDUCATION:  ?Education details: updated HEP  ?Person educated: Patient ?Education method: Explanation, Demonstration, Tactile cues, and Verbal cues ?Education comprehension: verbalized understanding, returned demonstration, verbal cues required, tactile cues required, and needs further education ? ? ?HOME EXERCISE PROGRAM: ?Access Code: [email or web address] ?URL: [email or web address] ?Date: 04/26/2021 ?Prepared by: Lorayne Bender ? ?Exercises ?Seated Hamstring Stretch - 1 x daily - 7 x weekly - 3 sets - 3 reps - 20 hold ?Supine Piriformis Stretch with Foot on Ground - 1 x daily - 7 x weekly - 3 sets - 3 reps - 20 hold ?Supine March - 1 x daily - 7 x weekly - 3 sets - 10 reps ?Hooklying Clamshell with Resistance - 1 x daily - 7 x weekly - 3 sets - 10 reps ? ? ?ASSESSMENT: ? ?CLINICAL IMPRESSION: ?The patient reports he has lacked motivation to perform exercises at home. 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 He tolerated exercises well. We will work more on technique as we go along. He tends to move his core more then he needs to with his exercises. We emphasized moving his body around his core while bracing and breathing. He was advised if he has more pain with his exercises when he goes to the gym on his own, to hold and we will work on it.  ? ? ?OBJECTIVE IMPAIRMENTS Abnormal gait, decreased activity tolerance, decreased endurance, difficulty walking, decreased ROM, decreased strength, increased muscle spasms, and pain.  ? ?ACTIVITY LIMITATIONS community activity, driving, shopping, and yard work.  ? ?PERSONAL FACTORS 1-2 comorbidities: DMII; Arthritis of cervical spine   are also affecting patient's functional outcome.  ? ? ?REHAB POTENTIAL: Good ? ?CLINICAL DECISION MAKING: Evolving/moderate complexity declining general mobility  ? ?EVALUATION COMPLEXITY: Moderate ? ? ?GOALS: ?Goals reviewed with patient? No ? ?SHORT TERM GOALS: ? ?STG Name Target Date Goal status  ?1 Patient will demonstrate equal strength L V R in his LE  ?Baseline:  06/01/2021 INITIAL  ?2 Patient will demonstrate full bilateral IR  ?Baseline:  06/01/2021 INITIAL  ?3 Patient will be independent with basic HEP  ?Baseline: 06/01/2021 INITIAL  ?LONG TERM GOALS:  ? ?LTG Name Target Date Goal status  ?1 Patient will return to walking for exercise  ?Baseline: 06/22/2021 INITIAL  ?2 Patient will be independent with full gym program  ?Baseline: 06/22/2021 INITIAL  ?3 Patient will ambulate 2 miles without increased lower back pain  ?Baseline: 06/22/2021 INITIAL  ?PLAN: ?PT FREQUENCY: 2x/week ? ?PT DURATION: 6 weeks ? ?PLANNED INTERVENTIONS: Therapeutic exercises, Therapeutic activity, Neuromuscular re-education, Gait training, Patient/Family education, Joint mobilization, Dry Needling, Electrical stimulation, Spinal manipulation, Spinal mobilization, Cryotherapy, Moist heat, Taping, and Manual  therapy ? ?PLAN FOR NEXT SESSION: on land progress supine exercises; consider standing scapular exercises; review stretches; progress to gym exercises as tolerated.  ? ? ?Dessie Coma, PT ?05/11/2021, 12:56 PM  ?

## 2021-05-18 ENCOUNTER — Encounter (HOSPITAL_BASED_OUTPATIENT_CLINIC_OR_DEPARTMENT_OTHER): Payer: Federal, State, Local not specified - PPO | Admitting: Physical Therapy

## 2021-05-22 ENCOUNTER — Encounter (HOSPITAL_BASED_OUTPATIENT_CLINIC_OR_DEPARTMENT_OTHER): Payer: Self-pay | Admitting: Physical Therapy

## 2021-05-22 ENCOUNTER — Ambulatory Visit (HOSPITAL_BASED_OUTPATIENT_CLINIC_OR_DEPARTMENT_OTHER): Payer: Federal, State, Local not specified - PPO | Admitting: Physical Therapy

## 2021-05-22 ENCOUNTER — Other Ambulatory Visit: Payer: Self-pay

## 2021-05-22 DIAGNOSIS — M545 Low back pain, unspecified: Secondary | ICD-10-CM | POA: Diagnosis not present

## 2021-05-22 DIAGNOSIS — R2689 Other abnormalities of gait and mobility: Secondary | ICD-10-CM

## 2021-05-22 NOTE — Therapy (Signed)
?OUTPATIENT PHYSICAL THERAPY THORACOLUMBAR EVALUATION ? ? ?Patient Name: Kenneth Lawson ?MRN: [id number] ?DOB:February 17, 1946, 76 y.o., male ?Today's Date: 05/22/2021 ? ? PT End of Session - 05/22/21 1443   ? ? Visit Number 4   ? Number of Visits 12   ? Date for PT Re-Evaluation 06/07/21   ? Authorization Type BCBS 75 visit limit   ? PT Start Time 1430   ? PT Stop Time 1510   ? PT Time Calculation (min) 40 min   ? Activity Tolerance Patient tolerated treatment well   ? Behavior During Therapy [facility] for tasks assessed/performed   ? ?  ?  ? ?  ? ? ?Past Medical History:  ?Diagnosis Date  ? Arthritis   ? Diabetes mellitus without complication (HCC)   ? Hypercholesterolemia   ? Hypertension   ? ?Past Surgical History:  ?Procedure Laterality Date  ? LAPARASCOPIC MEDICAN ARCUATE LIGAMENT RELEASE    ? MINOR HEMORRHOIDECTOMY N/A   ? ?Patient Active Problem List  ? Diagnosis Date Noted  ? Chronic low back pain 12/17/2017  ? Depression 03/21/2013  ? Type II or unspecified type diabetes mellitus without mention of complication, uncontrolled 09/15/2012  ? Pure hypercholesterolemia 09/15/2012  ? BPH (benign prostatic hypertrophy) 09/15/2012  ? ? ?PCP: Clovis Riley, L.August Saucer, MD ? ?REFERRING PROVIDER: Dr Venita Lick MD  ? ?REFERRING DIAG: Low Back Pain  ? ?THERAPY DIAG:  ?Chronic bilateral low back pain without sciatica ? ?Other abnormalities of gait and mobility ? ?ONSET DATE: increasing pain since 2020  ? ?SUBJECTIVE:                                                                                                                                                                                          ? ?SUBJECTIVE STATEMENT: ?The patient reports no significant increase in pain but also no decrease as well. He is somewhat frustrated by his lack of progress. He also admits he has not been as consitent as he could be with his activity.  ? ?PERTINENT HISTORY:  ?DMII; lumbar, cervical spine  ? ? ?PAIN:  ?Are you having pain? Yes 3/16 ?NPRS  scale: 0/10 currently at worst 3-4/10  ?Pain location:  ?Pain orientation: Bilateral  ?PAIN TYPE: aching ?Pain description: intermittent  ?Aggravating factors: Standing and walking  ?Relieving factors:  ? ?PRECAUTIONS: None ? ?WEIGHT BEARING RESTRICTIONS No ? ?FALLS:  ?Has patient fallen in last 6 months? No, Number of falls:  ? ?LIVING ENVIRONMENT: ? ?OCCUPATION: retired  ? ?PLOF: Independent ? ?PATIENT GOALS  ?To get back to active lifestyle/ to be able to walk and go to the gym/ long term goal to run  ? ? ?  OBJECTIVE:  ? ? ?TODAY'S TREATMENT  ?3/27 ?Manual: trigger point release to right upper gluteal / lower lumbar spine. LAD right LE  ? ?Supine march 2x10  ?Supine SLr WITH AB brace  ?2x10  ? ?Bridge 2x10  ? ? ?Hip abduction machine 6: 25 lbs  ?Life fitness machine 25 lbs  ?Cybex Leg press machine 60  ? ? ? ? ?3/16 ?Manual: trigger point release to right upper gluteal / lower lumbar spine. Reviewed self trigger point release with tennis ball and thera-cane.  ? ? ?Cables  ?Row 3x10 15lbs  ?Extension 3x10 10lbs  ?Chop x12 each side  ?Pallof press x5 bilateral  ? ? ?Exercises ?Seated Hamstring Stretch 3 reps - 20 hold ?Supine Piriformis Stretch with Foot on Ground 3 reps - 20 hold ?Supine March - 10 reps ?Hooklying Clamshell with Resistance 10 reps ? ? ?PATIENT EDUCATION:  ?Education details: updated HEP  ?Person educated: Patient ?Education method: Explanation, Demonstration, Tactile cues, and Verbal cues ?Education comprehension: verbalized understanding, returned demonstration, verbal cues required, tactile cues required, and needs further education ? ? ?HOME EXERCISE PROGRAM: ?Access Code: [email or web address] ?URL: [email or web address] ?Date: 04/26/2021 ?Prepared by: Lorayne Bender ? ?Exercises ?Seated Hamstring Stretch - 1 x daily - 7 x weekly - 3 sets - 3 reps - 20 hold ?Supine Piriformis Stretch with Foot on Ground - 1 x daily - 7 x weekly - 3 sets - 3 reps - 20 hold ?Supine March - 1 x daily - 7 x weekly  - 3 sets - 10 reps ?Hooklying Clamshell with Resistance - 1 x daily - 7 x weekly - 3 sets - 10 reps ? ? ?ASSESSMENT: ? ?CLINICAL IMPRESSION: ?Therapy reviewed supine exercises for home. He tolerated well. We also reviewed LE exercises in the gym. He has been doing more walking. He was advised he can use the treadmill ten do a few exercises at the gym. He continues to have a trigger point but it is less tender then it has been. He tolerated gym exercises well. He likes the feel of the gym exercises. We will continue to progress as tolerated.  ? ?OBJECTIVE IMPAIRMENTS Abnormal gait, decreased activity tolerance, decreased endurance, difficulty walking, decreased ROM, decreased strength, increased muscle spasms, and pain.  ? ?ACTIVITY LIMITATIONS community activity, driving, shopping, and yard work.  ? ?PERSONAL FACTORS 1-2 comorbidities: DMII; Arthritis of cervical spine   are also affecting patient's functional outcome.  ? ? ?REHAB POTENTIAL: Good ? ?CLINICAL DECISION MAKING: Evolving/moderate complexity declining general mobility  ? ?EVALUATION COMPLEXITY: Moderate ? ? ?GOALS: ?Goals reviewed with patient? No ? ?SHORT TERM GOALS: ? ?STG Name Target Date Goal status ?3/43  ?1 Patient will demonstrate equal strength L V R in his LE  ?Baseline:  06/12/2021 Not assessed today   ?2 Patient will demonstrate full bilateral IR  ?Baseline:  06/12/2021 Improved and progressing   ?3 Patient will be independent with basic HEP  ?Baseline: 06/12/2021 Working towards goal    ?LONG TERM GOALS:  ? ?LTG Name Target Date Goal status  ?1 Patient will return to walking for exercise  ?Baseline: 07/03/2021 INITIAL  ?2 Patient will be independent with full gym program  ?Baseline: 07/03/2021 INITIAL  ?3 Patient will ambulate 2 miles without increased lower back pain  ?Baseline: 07/03/2021 INITIAL  ?PLAN: ?PT FREQUENCY: 2x/week ? ?PT DURATION: 6 weeks ? ?PLANNED INTERVENTIONS: Therapeutic exercises, Therapeutic activity, Neuromuscular re-education,  Gait training, Patient/Family education, Joint mobilization, Dry Needling, Electrical stimulation, Spinal manipulation, Spinal mobilization, Cryotherapy, Moist heat,  Taping, and Manual therapy ? ?PLAN FOR NEXT SESSION: on land progress supine exercises; consider standing scapular exercises; review stretches; progress to gym exercises as tolerated.  ? ? ?Dessie Comaavid J Neala Miggins, PT ?05/22/2021, 3:44 PM  ?

## 2021-05-25 ENCOUNTER — Encounter (HOSPITAL_BASED_OUTPATIENT_CLINIC_OR_DEPARTMENT_OTHER): Payer: Federal, State, Local not specified - PPO | Admitting: Physical Therapy

## 2021-05-29 ENCOUNTER — Encounter (HOSPITAL_BASED_OUTPATIENT_CLINIC_OR_DEPARTMENT_OTHER): Payer: Federal, State, Local not specified - PPO | Admitting: Physical Therapy

## 2021-06-01 ENCOUNTER — Encounter (HOSPITAL_BASED_OUTPATIENT_CLINIC_OR_DEPARTMENT_OTHER): Payer: Federal, State, Local not specified - PPO | Admitting: Physical Therapy

## 2021-06-01 ENCOUNTER — Ambulatory Visit (HOSPITAL_BASED_OUTPATIENT_CLINIC_OR_DEPARTMENT_OTHER): Payer: Federal, State, Local not specified - PPO | Attending: Orthopedic Surgery | Admitting: Physical Therapy

## 2021-06-01 ENCOUNTER — Encounter (HOSPITAL_BASED_OUTPATIENT_CLINIC_OR_DEPARTMENT_OTHER): Payer: Self-pay | Admitting: Physical Therapy

## 2021-06-01 DIAGNOSIS — G8929 Other chronic pain: Secondary | ICD-10-CM | POA: Insufficient documentation

## 2021-06-01 DIAGNOSIS — M545 Low back pain, unspecified: Secondary | ICD-10-CM | POA: Insufficient documentation

## 2021-06-01 DIAGNOSIS — R2689 Other abnormalities of gait and mobility: Secondary | ICD-10-CM | POA: Diagnosis present

## 2021-06-01 NOTE — Therapy (Signed)
?OUTPATIENT PHYSICAL THERAPY THORACOLUMBAR Treatment  ? ? ?Patient Name: Kenneth Lawson ?MRN: [id number] ?DOB:[birth year], 76 y.o., male ?Today's Date: 06/02/2021 ? ? PT End of Session - 06/01/21 1109   ? ? Visit Number 5   ? Number of Visits 12   ? Date for PT Re-Evaluation 06/07/21   ? Authorization Type BCBS 75 visit limit   ? PT Start Time 1109   Patient 9 minutes late  ? PT Stop Time 1147   ? PT Time Calculation (min) 38 min   ? Activity Tolerance Patient tolerated treatment well   ? Behavior During Therapy [facility] for tasks assessed/performed   ? ?  ?  ? ?  ? ? ?Past Medical History:  ?Diagnosis Date  ? Arthritis   ? Diabetes mellitus without complication ([place])   ? Hypercholesterolemia   ? Hypertension   ? ?Past Surgical History:  ?Procedure Laterality Date  ? LAPARASCOPIC MEDICAN ARCUATE LIGAMENT RELEASE    ? MINOR HEMORRHOIDECTOMY N/A   ? ?Patient Active Problem List  ? Diagnosis Date Noted  ? Chronic low back pain 12/17/2017  ? Depression 03/21/2013  ? Type II or unspecified type diabetes mellitus without mention of complication, uncontrolled 09/15/2012  ? Pure hypercholesterolemia 09/15/2012  ? BPH (benign prostatic hypertrophy) 09/15/2012  ? ? ?PCP: Alroy Dust, L.Marlou Sa, MD ? ?REFERRING PROVIDER: Dr Melina Schools MD  ? ?REFERRING DIAG: Low Back Pain  ? ?THERAPY DIAG:  ?Chronic bilateral low back pain without sciatica ? ?Other abnormalities of gait and mobility ? ?ONSET DATE: increasing pain since 2020  ? ?SUBJECTIVE:                                                                                                                                                                                          ? ?SUBJECTIVE STATEMENT: ?The patient reports he has had no major back pain. He has other life stressors but no real back pain. He flet great after his last visit ? ?PERTINENT HISTORY:  ?DMII; lumbar, cervical spine  ? ? ?PAIN:  ?Are you having pain? Yes 3/16 ?NPRS scale: 0/10 currently at worst 3-4/10  ?Pain location:   ?Pain orientation: Bilateral  ?PAIN TYPE: aching ?Pain description: intermittent  ?Aggravating factors: Standing and walking  ?Relieving factors:  ? ?PRECAUTIONS: None ? ?WEIGHT BEARING RESTRICTIONS No ? ?FALLS:  ?Has patient fallen in last 6 months? No, Number of falls:  ? ?LIVING ENVIRONMENT: ? ?OCCUPATION: retired  ? ?PLOF: Independent ? ?PATIENT GOALS  ?To get back to active lifestyle/ to be able to walk and go to the gym/ long term goal to run  ? ? ?OBJECTIVE:  ?LUMBARAROM/PROM ?  ?  4/7  ?Flexion 90 mild pulling   ?Extension    ?Right lateral flexion    ?Left lateral flexion    ?Right rotation  full   ?Left rotation  full   ? (Blank rows = not tested) ?  ?LE AROM/PROM: ?  ?MMT Right ?04/28/2021 Left ?04/28/2021    ?Hip flexion 21.4 29.7 35.1 31.2  ?Hip extension        ?Hip abduction 47.8 41.8 48.1 41.2  ?Hip adduction        ?Hip internal rotation        ?Hip external rotation        ?Knee flexion        ?Knee extension 51.4 43.0 65.6 51.3  ?Ankle dorsiflexion        ?Ankle plantarflexion        ?Ankle inversion        ?Ankle eversion        ? (Blank rows = not tested) ?  ?  ?  ?PROM  Right ?04/28/2021 Left ?04/28/2021  ?Hip flexion WNL WNL  ?Hip extension      ?Hip abduction      ?Hip adduction      ?Hip internal rotation Only to neutral  Only to neutral   ?Hip external rotation WNL WNL  ?Knee flexion      ?Knee extension      ?Ankle dorsiflexion      ?Ankle plantarflexion      ?Ankle inversion      ?Ankle eversion      ? (Blank rows = not tested) ?  ? ? ?TODAY'S TREATMENT  ?4/6 ?Reviewed tests and measures. Reviewed goals going forward  ? ?Gym exercises ? ? Life fitness extension 2x10  ?Biceps curl cable machine 2x10  ?Life fitness row machine 25 lbs 2x20  ?Hip abduction machine  ? ? ? ? ?3/27 ?Manual: trigger point release to right upper gluteal / lower lumbar spine. LAD right LE  ? ?Supine march 2x10  ?Supine SLr WITH AB brace  ?2x10  ? ?Bridge 2x10  ? ? ?Hip abduction machine 6: 25 lbs  ?Life fitness machine 25  lbs  ?Cybex Leg press machine 60  ? ? ? ? ? ?3/16 ?Manual: trigger point release to right upper gluteal / lower lumbar spine. Reviewed self trigger point release with tennis ball and thera-cane.  ? ? ?Cables  ?Row 3x10 15lbs  ?Extension 3x10 10lbs  ?Chop x12 each side  ?Pallof press x5 bilateral  ? ? ?Exercises ?Seated Hamstring Stretch 3 reps - 20 hold ?Supine Piriformis Stretch with Foot on Ground 3 reps - 20 hold ?Supine March - 10 reps ?Hooklying Clamshell with Resistance 10 reps ? ? ?PATIENT EDUCATION:  ?Education details: updated HEP  ?Person educated: Patient ?Education method: Explanation, Demonstration, Tactile cues, and Verbal cues ?Education comprehension: verbalized understanding, returned demonstration, verbal cues required, tactile cues required, and needs further education ? ? ?HOME EXERCISE PROGRAM: ?Access Code: [email or web address] ?URL: [email or web address] ?Date: 04/26/2021 ?Prepared by: Carolyne Littles ? ?Exercises ?Seated Hamstring Stretch - 1 x daily - 7 x weekly - 3 sets - 3 reps - 20 hold ?Supine Piriformis Stretch with Foot on Ground - 1 x daily - 7 x weekly - 3 sets - 3 reps - 20 hold ?Supine March - 1 x daily - 7 x weekly - 3 sets - 10 reps ?Hooklying Clamshell with Resistance - 1 x daily - 7 x weekly - 3 sets - 10 reps ? ? ?ASSESSMENT: ? ?CLINICAL  IMPRESSION: ?The patient is making great progress. His FOTO has improved to past his goals. He enjoys the gym exercises. His strength has improved in all planes. He is working on becoming more consistent with his exercises. He will try to get into the gym. We will continue 1W6 to continue building his gym program.  ?OBJECTIVE IMPAIRMENTS Abnormal gait, decreased activity tolerance, decreased endurance, difficulty walking, decreased ROM, decreased strength, increased muscle spasms, and pain.  ? ?ACTIVITY LIMITATIONS community activity, driving, shopping, and yard work.  ? ?PERSONAL FACTORS 1-2 comorbidities: DMII; Arthritis of cervical spine    are also affecting patient's functional outcome.  ? ? ?REHAB POTENTIAL: Good ? ?CLINICAL DECISION MAKING: Evolving/moderate complexity declining general mobility  ? ?EVALUATION COMPLEXITY: Moderate ? ? ?GOALS: ?Goals reviewed with patient? No ? ?SHORT TERM GOALS: ? ?STG Name Target Date Goal status ?4/7  ?1 Patient will demonstrate equal strength L V R in his LE  ?Baseline:  06/23/2021 Significnt improvement in strength achieved   ?2 Patient will demonstrate full bilateral IR  ?Baseline:  06/23/2021 Improved and progressing   ?3 Patient will be independent with basic HEP  ?Baseline: 06/23/2021 Has base HEP  ?Achieved   ?LONG TERM GOALS:  ? ?LTG Name Target Date Goal status  ?1 Patient will return to walking for exercise  ?Baseline: 07/14/2021 Has returned to walking for exercises achieved  ?2 Patient will be independent with full gym program  ?Baseline: 07/14/2021 Continuing to progress   ?3 Patient will ambulate 2 miles without increased lower back pain  ?Baseline: 07/14/2021 Is walking over a mile and progressing   ?PLAN: ?PT FREQUENCY: 2x/week ? ?PT DURATION: 6 weeks ? ?PLANNED INTERVENTIONS: Therapeutic exercises, Therapeutic activity, Neuromuscular re-education, Gait training, Patient/Family education, Joint mobilization, Dry Needling, Electrical stimulation, Spinal manipulation, Spinal mobilization, Cryotherapy, Moist heat, Taping, and Manual therapy ? ?PLAN FOR NEXT SESSION: on land progress supine exercises; consider standing scapular exercises; review stretches; progress to gym exercises as tolerated.  ? ? ?Carney Living, PT ?06/02/2021, 6:07 AM  ?

## 2021-06-02 ENCOUNTER — Encounter (HOSPITAL_BASED_OUTPATIENT_CLINIC_OR_DEPARTMENT_OTHER): Payer: Self-pay | Admitting: Physical Therapy

## 2021-06-05 ENCOUNTER — Encounter (HOSPITAL_BASED_OUTPATIENT_CLINIC_OR_DEPARTMENT_OTHER): Payer: Self-pay | Admitting: Physical Therapy

## 2021-06-05 ENCOUNTER — Encounter (HOSPITAL_BASED_OUTPATIENT_CLINIC_OR_DEPARTMENT_OTHER): Payer: Federal, State, Local not specified - PPO | Admitting: Physical Therapy

## 2021-06-05 ENCOUNTER — Other Ambulatory Visit: Payer: Self-pay | Admitting: Endocrinology

## 2021-06-05 ENCOUNTER — Ambulatory Visit (HOSPITAL_BASED_OUTPATIENT_CLINIC_OR_DEPARTMENT_OTHER): Payer: Federal, State, Local not specified - PPO | Admitting: Physical Therapy

## 2021-06-05 DIAGNOSIS — M545 Low back pain, unspecified: Secondary | ICD-10-CM | POA: Diagnosis not present

## 2021-06-05 DIAGNOSIS — E1165 Type 2 diabetes mellitus with hyperglycemia: Secondary | ICD-10-CM

## 2021-06-05 DIAGNOSIS — G8929 Other chronic pain: Secondary | ICD-10-CM

## 2021-06-05 DIAGNOSIS — R2689 Other abnormalities of gait and mobility: Secondary | ICD-10-CM

## 2021-06-05 NOTE — Therapy (Addendum)
OUTPATIENT PHYSICAL THERAPY THORACOLUMBAR Treatment /discharge    Patient Name: Kenneth Lawson MRN: [id number] DOB:09/22/1945, 76 y.o., male Today's Date: 06/05/2021   PT End of Session - 06/05/21 1112     Visit Number 6    Number of Visits 11    Date for PT Re-Evaluation 07/14/21    Authorization Type BCBS 75 visit limit    PT Start Time 1100    PT Stop Time 1142    PT Time Calculation (min) 42 min    Activity Tolerance Patient tolerated treatment well    Behavior During Therapy WFL for tasks assessed/performed             Past Medical History:  Diagnosis Date   Arthritis    Diabetes mellitus without complication ([place])    Hypercholesterolemia    Hypertension    Past Surgical History:  Procedure Laterality Date   LAPARASCOPIC MEDICAN ARCUATE LIGAMENT RELEASE     MINOR HEMORRHOIDECTOMY N/A    Patient Active Problem List   Diagnosis Date Noted   Chronic low back pain 12/17/2017   Depression 03/21/2013   Type II or unspecified type diabetes mellitus without mention of complication, uncontrolled 09/15/2012   Pure hypercholesterolemia 09/15/2012   BPH (benign prostatic hypertrophy) 09/15/2012    PCP: Alroy Dust, L.Marlou Sa, MD  REFERRING PROVIDER: Dr Melina Schools MD   REFERRING DIAG: Low Back Pain   THERAPY DIAG:  No diagnosis found.  ONSET DATE: increasing pain since 2020   SUBJECTIVE:                                                                                                                                                                                           SUBJECTIVE STATEMENT: The patient has ben to the MD. He is happy with his progress. He reports he has felt good. He feels like he is ready to work on his HEP.    PERTINENT HISTORY:  DMII; lumbar, cervical spine    PAIN:  Are you having pain? No 4/10  NPRS scale: 0/10 currently at worst 3-4/10  Pain location:  Pain orientation: Bilateral  PAIN TYPE: aching Pain description: intermittent   Aggravating factors: Standing and walking  Relieving factors:   PRECAUTIONS: None  WEIGHT BEARING RESTRICTIONS No  FALLS:  Has patient fallen in last 6 months? No, Number of falls:   LIVING ENVIRONMENT:  OCCUPATION: retired   PLOF: Independent  PATIENT GOALS  To get back to active lifestyle/ to be able to walk and go to the gym/ long term goal to run    OBJECTIVE:  LUMBARAROM/PROM    4/7  Flexion 90 mild pulling  Extension    Right lateral flexion    Left lateral flexion    Right rotation  full   Left rotation  full    (Blank rows = not tested)   LE AROM/PROM:   MMT Right 04/28/2021 Left 04/28/2021    Hip flexion 21.4 29.7 35.1 31.2  Hip extension        Hip abduction 47.8 41.8 48.1 41.2  Hip adduction        Hip internal rotation        Hip external rotation        Knee flexion        Knee extension 51.4 43.0 65.6 51.3  Ankle dorsiflexion        Ankle plantarflexion        Ankle inversion        Ankle eversion         (Blank rows = not tested)       PROM  Right 04/28/2021 Left 04/28/2021  Hip flexion WNL WNL  Hip extension      Hip abduction      Hip adduction      Hip internal rotation Only to neutral  Only to neutral   Hip external rotation WNL WNL  Knee flexion      Knee extension      Ankle dorsiflexion      Ankle plantarflexion      Ankle inversion      Ankle eversion       (Blank rows = not tested)     TODAY'S TREATMENT  4/10 Lat pull down 2x15 15 lbs  Knee extension 2x15 15lbs  Shoulder press up 2x15  10 lbs  Nu-step 5 min   Reviewed complete HEP; reviewed how to use his stretches; reviewed progression of gym activity. Reviewed set up of gym exercises; reviewed use of cardio equipment; reviewed use of the nu-step vs running       4/6 Reviewed tests and measures. Reviewed goals going forward   Gym exercises   Life fitness extension 2x10  Biceps curl cable machine 2x10  Life fitness row machine 25 lbs 2x20  Hip abduction  machine 40 lbs 2x15      3/27 Manual: trigger point release to right upper gluteal / lower lumbar spine. LAD right LE   Supine march 2x10  Supine SLr WITH AB brace  2x10   Bridge 2x10    Hip abduction machine 6: 25 lbs  Life fitness machine 25 lbs  Cybex Leg press machine 60        PATIENT EDUCATION:  Education details: updated HEP; reviewed POC and progression of activity  Person educated: Patient Education method: Explanation, Demonstration, Tactile cues, and Verbal cues Education comprehension: verbalized understanding, returned demonstration, verbal cues required, tactile cues required, and needs further education   HOME EXERCISE PROGRAM: Access Code: [email or web address] URL: [email or web address] Date: 04/26/2021 Prepared by: Carolyne Littles  Exercises Seated Hamstring Stretch - 1 x daily - 7 x weekly - 3 sets - 3 reps - 20 hold Supine Piriformis Stretch with Foot on Ground - 1 x daily - 7 x weekly - 3 sets - 3 reps - 20 hold Supine March - 1 x daily - 7 x weekly - 3 sets - 10 reps Hooklying Clamshell with Resistance - 1 x daily - 7 x weekly - 3 sets - 10 reps   ASSESSMENT:  CLINICAL IMPRESSION: Therapy reviewed more gym equipment with the patient. He was advised to schedule his intake  appointment with our health and wellness specialist. He plans on it. He would like to work on his exercises for a while and see how it goes. He was advised that this is fine. We can leave him open up to a month. We reviewed exercises at home. He started jogging. He was advised the elliptical may be a better option. Therapy reviewed set up of the nu-step   OBJECTIVE IMPAIRMENTS Abnormal gait, decreased activity tolerance, decreased endurance, difficulty walking, decreased ROM, decreased strength, increased muscle spasms, and pain.   ACTIVITY LIMITATIONS community activity, driving, shopping, and yard work.   PERSONAL FACTORS 1-2 comorbidities: DMII; Arthritis of cervical spine    are also affecting patient's functional outcome.    REHAB POTENTIAL: Good  CLINICAL DECISION MAKING: Evolving/moderate complexity declining general mobility   EVALUATION COMPLEXITY: Moderate   GOALS: Goals reviewed with patient? No  SHORT TERM GOALS:  STG Name Target Date Goal status 4/7  1 Patient will demonstrate equal strength L V R in his LE  Baseline:  06/26/2021 Significnt improvement in strength achieved   2 Patient will demonstrate full bilateral IR  Baseline:  06/26/2021 Improved and progressing   3 Patient will be independent with basic HEP  Baseline: 06/26/2021 Has base HEP  Achieved   LONG TERM GOALS:   LTG Name Target Date Goal status  1 Patient will return to walking for exercise  Baseline: 07/17/2021 Has returned to walking for exercises achieved  2 Patient will be independent with full gym program  Baseline: 07/17/2021 Continuing to progress   3 Patient will ambulate 2 miles without increased lower back pain  Baseline: 07/17/2021 Is walking over a mile and progressing   PLAN: PT FREQUENCY: 2x/week  PT DURATION: 6 weeks  PLANNED INTERVENTIONS: Therapeutic exercises, Therapeutic activity, Neuromuscular re-education, Gait training, Patient/Family education, Joint mobilization, Dry Needling, Electrical stimulation, Spinal manipulation, Spinal mobilization, Cryotherapy, Moist heat, Taping, and Manual therapy  PLAN FOR NEXT SESSION: on land progress supine exercises; consider standing scapular exercises; review stretches; progress to gym exercises as tolerated.   PHYSICAL THERAPY DISCHARGE SUMMARY  Visits from Start of Care: 6  Current functional level related to goals / functional outcomes: Improved pain; able to exercises; comfortable with gym program    Remaining deficits: Pain at times    Education / Equipment: HEP    Patient agrees to discharge. Patient goals were met. Patient is being discharged due to meeting the stated rehab goals.   Carney Living,  PT 06/05/2021, 11:20 AM

## 2021-06-07 ENCOUNTER — Encounter (HOSPITAL_BASED_OUTPATIENT_CLINIC_OR_DEPARTMENT_OTHER): Payer: Federal, State, Local not specified - PPO | Admitting: Physical Therapy

## 2021-06-08 ENCOUNTER — Encounter (HOSPITAL_BASED_OUTPATIENT_CLINIC_OR_DEPARTMENT_OTHER): Payer: Federal, State, Local not specified - PPO | Admitting: Physical Therapy

## 2021-06-12 ENCOUNTER — Encounter (HOSPITAL_BASED_OUTPATIENT_CLINIC_OR_DEPARTMENT_OTHER): Payer: Federal, State, Local not specified - PPO | Admitting: Physical Therapy

## 2021-06-15 ENCOUNTER — Encounter (HOSPITAL_BASED_OUTPATIENT_CLINIC_OR_DEPARTMENT_OTHER): Payer: Federal, State, Local not specified - PPO | Admitting: Physical Therapy

## 2021-06-19 ENCOUNTER — Encounter (HOSPITAL_BASED_OUTPATIENT_CLINIC_OR_DEPARTMENT_OTHER): Payer: Federal, State, Local not specified - PPO | Admitting: Physical Therapy

## 2021-06-22 ENCOUNTER — Encounter (HOSPITAL_BASED_OUTPATIENT_CLINIC_OR_DEPARTMENT_OTHER): Payer: Federal, State, Local not specified - PPO | Admitting: Physical Therapy

## 2021-06-26 ENCOUNTER — Other Ambulatory Visit (INDEPENDENT_AMBULATORY_CARE_PROVIDER_SITE_OTHER): Payer: Federal, State, Local not specified - PPO

## 2021-06-26 DIAGNOSIS — E1165 Type 2 diabetes mellitus with hyperglycemia: Secondary | ICD-10-CM

## 2021-06-26 LAB — BASIC METABOLIC PANEL
BUN: 17 mg/dL (ref 6–23)
CO2: 28 mEq/L (ref 19–32)
Calcium: 8.8 mg/dL (ref 8.4–10.5)
Chloride: 105 mEq/L (ref 96–112)
Creatinine, Ser: 1.14 mg/dL (ref 0.40–1.50)
GFR: 63.58 mL/min (ref >60.00)
Glucose, Bld: 210 mg/dL — ABNORMAL HIGH (ref 70–99)
Potassium: 4.4 mEq/L (ref 3.5–5.1)
Sodium: 140 mEq/L (ref 135–145)

## 2021-06-26 LAB — HEMOGLOBIN A1C: Hgb A1c MFr Bld: 6 % (ref 4.6–6.5)

## 2021-06-28 ENCOUNTER — Encounter: Payer: Self-pay | Admitting: Endocrinology

## 2021-06-28 ENCOUNTER — Ambulatory Visit: Payer: Federal, State, Local not specified - PPO | Admitting: Endocrinology

## 2021-06-28 VITALS — BP 138/62 | HR 89 | Ht 68.5 in | Wt 166.0 lb

## 2021-06-28 DIAGNOSIS — E7800 Pure hypercholesterolemia, unspecified: Secondary | ICD-10-CM

## 2021-06-28 DIAGNOSIS — E1165 Type 2 diabetes mellitus with hyperglycemia: Secondary | ICD-10-CM

## 2021-06-28 NOTE — Patient Instructions (Signed)
Less carbs at breakfast ? ?Check blood sugars on waking up 2-3 days a week ? ?Also check blood sugars about 2 hours after meals and do this after different meals by rotation ? ?Recommended blood sugar levels on waking up are 90-130 and about 2 hours after meal is 130-160 ? ?Please bring your blood sugar monitor to each visit, thank you ? ?

## 2021-06-28 NOTE — Progress Notes (Signed)
? ? ?Patient ID: Kenneth Lawson, male   DOB: 1945/11/29, 76 y.o.   MRN: [id number] ? ? ?Reason for Appointment: Diabetes follow-up  ? ? ?History of Present Illness  ? ? Diagnosis: date of diagnosis: 1997.  ? ?Previous history: He was previously treated with glyburide, Actos and metformin.  ?Metformin was stopped because he had GI side effects ? His blood sugars had been relatively high in the last 3-4 years and A1c had been consistently over 7.5% until his initial consultation He was started on Invokana in 10/13 which had helped modestly. ?His blood sugars had shown considerable improvement with  adding  Nesina since 2/14 .   ? He was also on Actos which he had stopped but blood sugars did not appear to worsen. Marland Kitchen  ?The previous HbgA1c levels: 6.4 in 4/14,  7.4 in 12/13, prior range from 7.8-8.3 since 2011 ?  ?RECENT history:  ? ?Oral hypoglycemic drugs: Oseni 25/30,   Amaryl 4 mg at breakfast and lunch, Rybelsus 7 mg daily ? ?A1c is much better at 6% ? ? ?Current blood sugar patterns, management and problems identified: ? ?He appears to be doing generally better with his exercise regimen and medications  ?Follow-up with 7 mg Rybelsus is overall control is better  ?His main difficulty is controlling the carbohydrates at breakfast as before ?At times he will have both toast and oatmeal and only some protein  ?With this his blood sugars will be sometimes over 200 including in the lab  ?Blood sugar monitoring has been somewhat sporadic and not after dinner  ?Most of his readings at other times are fairly good however was restarted on the 3 mg Rybelsus  ?No significant hypoglycemia although again he says if he misses lunch she will feel hypoglycemic in the afternoon and lowest reading has been about 68 ?He was told to leave off Amaryl at lunch if he was not eating a meal ?He has done well with his exercise regimen with either walking or going to the gym almost every day and may be walking more than 2 miles at  times ? ? ?Dinner usually around 8 pm ? ? Side effects from medications: bloating, gas and GI distress from metformin, even 500 mg, balanitis from Invokana and Jardiance ? ?Glucometer: ACCU-CHEK.  ? ? ?PRE-MEAL Fasting Lunch Dinner Bedtime Overall  ?Glucose range: 96, 121 133-2 18 108-186    ?Mean/median:     134  ? ?POST-MEAL PC Breakfast PC Lunch PC Dinner  ?Glucose range:   ?  ?Mean/median:     ? ?Previously: ? ?PRE-MEAL Fasting Lunch Dinner Bedtime Overall  ?Glucose range:   58-96 111   ?Mean/median:     118  ? ?POST-MEAL PC Breakfast PC Lunch PC Dinner  ?Glucose range: 121-178    ?Mean/median:     ? ? ?Dietician visit: Most recent: 1997 at diagnosis.  ?.  ? ?Wt Readings from Last 3 Encounters:  ?06/28/21 166 lb (75.3 kg)  ?03/31/21 170 lb 12.8 oz (77.5 kg)  ?01/27/21 167 lb 3.2 oz (75.8 kg)  ? ? ?Lab Results  ?Component Value Date  ? HGBA1C 6.0 06/26/2021  ? HGBA1C 6.7 (H) 01/25/2021  ? HGBA1C 6.7 (H) 10/17/2020  ? ?Lab Results  ?Component Value Date  ? MICROALBUR 2.7 (H) 10/17/2020  ? [place] 97 10/17/2020  ? CREATININE 1.14 06/26/2021  ? ? ?OTHER active problems: See review of systems ? ? ?Lab on 06/26/2021  ?Component Date Value Ref Range Status  ? Sodium 06/26/2021  140  135 - 145 mEq/L Final  ? Potassium 06/26/2021 4.4  3.5 - 5.1 mEq/L Final  ? Chloride 06/26/2021 105  96 - 112 mEq/L Final  ? CO2 06/26/2021 28  19 - 32 mEq/L Final  ? Glucose, Bld 06/26/2021 210 (H)  70 - 99 mg/dL Final  ? BUN 06/26/2021 17  6 - 23 mg/dL Final  ? Creatinine, Ser 06/26/2021 1.14  0.40 - 1.50 mg/dL Final  ? GFR 06/26/2021 63.58  >60.00 mL/min Final  ? Calculated using the CKD-EPI Creatinine Equation (2021)  ? Calcium 06/26/2021 8.8  8.4 - 10.5 mg/dL Final  ? Hgb A1c MFr Bld 06/26/2021 6.0  4.6 - 6.5 % Final  ? Glycemic Control Guidelines for People with Diabetes:Non Diabetic:  <6%Goal of Therapy: <7%Additional Action Suggested:  >8%   ? ? ?Allergies as of 06/28/2021   ? ?   Reactions  ? Metformin Diarrhea, Other (See Comments)   ? ?  ? ?  ?Medication List  ?  ? ?  ? Accurate as of Jun 28, 2021 11:28 AM. If you have any questions, ask your nurse or doctor.  ?  ?  ? ?  ? ?Accu-Chek Guide test strip ?Generic drug: glucose blood ?Use Accu Chek Guide test strips as instructed to check blood sugar once daily. ?  ?Accu-Chek Guide test strip ?Generic drug: glucose blood ?USE TO CHECK BLOOD SUGARS THREE TIMES DAILY. ?  ?Accu-Chek Guide w/Device Kit ?1 each by Does not apply route daily. Use accu chek guide meter to check blood sugar once daily. ?  ?Accu-Chek Softclix Lancets lancets ?3 times daily As directed DXE11.65 ?  ?alfuzosin 10 MG 24 hr tablet ?Commonly known as: UROXATRAL ?Take 10 mg by mouth daily. ?  ?Alogliptin-Pioglitazone 25-30 MG Tabs ?Take 1 tablet by mouth daily. ?  ?ALPRAZolam 0.5 MG tablet ?Commonly known as: Duanne Moron ?Take 0.5 mg by mouth every other day as needed. Take 1/2 tablet by mouth every other day as needed. ?  ?atorvastatin 80 MG tablet ?Commonly known as: LIPITOR ?TAKE 1 TABLET DAILY ?  ?buPROPion 300 MG 24 hr tablet ?Commonly known as: WELLBUTRIN XL ?Indications: anxiousness associated with depression. Take 1 tablet by mouth every day ?  ?finasteride 5 MG tablet ?Commonly known as: PROSCAR ?  ?glimepiride 4 MG tablet ?Commonly known as: AMARYL ?Take 1/2 Amaryl at lunch if eating a meal, otherwise skip ?  ?hydrocortisone-pramoxine 2.5-1 % rectal cream ?Commonly known as: ANALPRAM-HC ?  ?mometasone 50 MCG/ACT nasal spray ?Commonly known as: NASONEX ?USE 2 SPRAYS IN EACH NOSTRIL NASALLY ONCE A DAY AS NEEDED ?  ?omeprazole 40 MG capsule ?Commonly known as: PRILOSEC ?as needed. ?  ?Rybelsus 3 MG Tabs ?Generic drug: Semaglutide ?Take 3 mg by mouth daily before breakfast. ?  ?Rybelsus 7 MG Tabs ?Generic drug: Semaglutide ?Take 1 tablet by mouth daily before breakfast. With half glass water.  Start after 3 mg runs out ?  ?tadalafil 5 MG tablet ?Commonly known as: CIALIS ?Take 5 mg by mouth daily as needed for erectile  dysfunction. ?  ?Vilazodone HCl 20 MG Tabs ?Indications: major depressive disorder. Take 1 tablet by mouth every day ?  ? ?  ? ? ?Allergies:  ?Allergies  ?Allergen Reactions  ? Metformin Diarrhea and Other (See Comments)  ? ? ?Past Medical History:  ?Diagnosis Date  ? Arthritis   ? Diabetes mellitus without complication ([place])   ? Hypercholesterolemia   ? Hypertension   ? ? ?Past Surgical History:  ?Procedure  Laterality Date  ? LAPARASCOPIC MEDICAN ARCUATE LIGAMENT RELEASE    ? MINOR HEMORRHOIDECTOMY N/A   ? ? ?Family History  ?Problem Relation Age of Onset  ? Cancer Neg Hx   ? ? ?Social History:  reports that he has quit smoking. His smoking use included cigarettes. He has never used smokeless tobacco. He reports current alcohol use. He reports that he does not use drugs. ? ?Review of Systems:  ? ? ?HYPERLIPIDEMIA: Followed by PCP ?No history of CAD ?Has been taking 80 mg Lipitor and LDL as follows ? ? ?Lab Results  ?Component Value Date  ? CHOL 164 10/17/2020  ? CHOL 170 07/19/2020  ? CHOL 185 02/01/2020  ? ?Lab Results  ?Component Value Date  ? HDL 52.80 10/17/2020  ? HDL 56.60 07/19/2020  ? HDL 58.30 02/01/2020  ? ?Lab Results  ?Component Value Date  ? [place] 97 10/17/2020  ? LDLCALC 103 (H) 07/19/2020  ? LDLCALC 119 (H) 02/01/2020  ? ?Lab Results  ?Component Value Date  ? TRIG 72.0 10/17/2020  ? TRIG 52.0 07/19/2020  ? TRIG 42.0 02/01/2020  ? ?Lab Results  ?Component Value Date  ? CHOLHDL 3 10/17/2020  ? CHOLHDL 3 07/19/2020  ? CHOLHDL 3 02/01/2020  ? ?Lab Results  ?Component Value Date  ? LDLDIRECT 134.6 11/18/2013  ? ? ? ? ?Diabetic foot exam in 6/21 showed normal monofilament sensation in the toes and plantar surfaces, no skin lesions or ulcers on the feet and normal pedal pulses ? ?No history of hypertension or microalbuminuria ? ? ?BP Readings from Last 3 Encounters:  ?06/28/21 138/62  ?03/31/21 140/80  ?01/27/21 130/64  ? ?His annual eye exam was in 4/23 ? ? ? Examination: ?  ?BP 138/62   Pulse 89   Ht  5' 8.5" (1.74 m)   Wt 166 lb (75.3 kg)   SpO2 98%   BMI 24.87 kg/m?   Body mass index is 24.87 kg/m?.  ?  ? ? ?Assesment/Plan:  ? ?Diabetes type 2 without significant obesity ? ?See history of present illness for detail

## 2021-08-25 ENCOUNTER — Encounter (HOSPITAL_BASED_OUTPATIENT_CLINIC_OR_DEPARTMENT_OTHER): Payer: Self-pay | Admitting: Physical Therapy

## 2021-08-26 ENCOUNTER — Other Ambulatory Visit: Payer: Self-pay | Admitting: Endocrinology

## 2021-10-07 ENCOUNTER — Other Ambulatory Visit: Payer: Self-pay | Admitting: Endocrinology

## 2021-10-07 DIAGNOSIS — E1165 Type 2 diabetes mellitus with hyperglycemia: Secondary | ICD-10-CM

## 2021-10-26 ENCOUNTER — Other Ambulatory Visit (INDEPENDENT_AMBULATORY_CARE_PROVIDER_SITE_OTHER): Payer: Federal, State, Local not specified - PPO

## 2021-10-26 DIAGNOSIS — E1165 Type 2 diabetes mellitus with hyperglycemia: Secondary | ICD-10-CM

## 2021-10-26 DIAGNOSIS — E7800 Pure hypercholesterolemia, unspecified: Secondary | ICD-10-CM | POA: Diagnosis not present

## 2021-10-26 LAB — COMPREHENSIVE METABOLIC PANEL
ALT: 17 U/L (ref 0–53)
AST: 16 U/L (ref 0–37)
Albumin: 4.2 g/dL (ref 3.5–5.2)
Alkaline Phosphatase: 71 U/L (ref 39–117)
BUN: 20 mg/dL (ref 6–23)
CO2: 29 mEq/L (ref 19–32)
Calcium: 9.1 mg/dL (ref 8.4–10.5)
Chloride: 105 mEq/L (ref 96–112)
Creatinine, Ser: 1.17 mg/dL (ref 0.40–1.50)
GFR: 61.48 mL/min (ref >60.00)
Glucose, Bld: 213 mg/dL — ABNORMAL HIGH (ref 70–99)
Potassium: 4 mEq/L (ref 3.5–5.1)
Sodium: 140 mEq/L (ref 135–145)
Total Bilirubin: 0.5 mg/dL (ref 0.2–1.2)
Total Protein: 7 g/dL (ref 6.0–8.3)

## 2021-10-26 LAB — HEMOGLOBIN A1C: Hgb A1c MFr Bld: 6.1 % (ref 4.6–6.5)

## 2021-10-26 LAB — LIPID PANEL
Cholesterol: 164 mg/dL (ref 0–200)
HDL: 55.5 mg/dL (ref >39.00)
LDL Cholesterol: 93 mg/dL (ref 0–99)
NonHDL: 108.19
Total CHOL/HDL Ratio: 3
Triglycerides: 74 mg/dL (ref 0.0–149.0)
VLDL: 14.8 mg/dL (ref 0.0–40.0)

## 2021-10-26 LAB — MICROALBUMIN / CREATININE URINE RATIO
Creatinine,U: 290.7 mg/dL
Microalb Creat Ratio: 0.9 mg/g (ref 0.0–30.0)
Microalb, Ur: 2.6 mg/dL — ABNORMAL HIGH (ref 0.0–1.9)

## 2021-10-31 ENCOUNTER — Ambulatory Visit: Payer: Federal, State, Local not specified - PPO | Admitting: Endocrinology

## 2021-11-03 ENCOUNTER — Encounter: Payer: Self-pay | Admitting: Endocrinology

## 2021-11-03 MED ORDER — ATORVASTATIN CALCIUM 80 MG PO TABS: 80.0000 mg | ORAL_TABLET | Freq: Every day | ORAL | 3 refills | Status: DC

## 2021-11-16 NOTE — Progress Notes (Deleted)
Patient ID: Kenneth Lawson, male   DOB: August 23, 1945, 76 y.o.   MRN: [id number]   Reason for Appointment: Diabetes follow-up    History of Present Illness    Diagnosis: date of diagnosis: 1997.   Previous history: He was previously treated with glyburide, Actos and metformin.  Metformin was stopped because he had GI side effects  His blood sugars had been relatively high in the last 3-4 years and A1c had been consistently over 7.5% until his initial consultation He was started on Invokana in 10/13 which had helped modestly. His blood sugars had shown considerable improvement with  adding  Nesina since 2/14 .    He was also on Actos which he had stopped but blood sugars did not appear to worsen. .  The previous HbgA1c levels: 6.4 in 4/14,  7.4 in 12/13, prior range from 7.8-8.3 since 2011   RECENT history:   Oral hypoglycemic drugs: Oseni 25/30,   Amaryl 4 mg at breakfast and lunch, Rybelsus 7 mg daily  A1c is much better at 6%   Current blood sugar patterns, management and problems identified:  He appears to be doing generally better with his exercise regimen and medications  Follow-up with 7 mg Rybelsus is overall control is better  His main difficulty is controlling the carbohydrates at breakfast as before At times he will have both toast and oatmeal and only some protein  With this his blood sugars will be sometimes over 200 including in the lab  Blood sugar monitoring has been somewhat sporadic and not after dinner  Most of his readings at other times are fairly good however was restarted on the 3 mg Rybelsus  No significant hypoglycemia although again he says if he misses lunch she will feel hypoglycemic in the afternoon and lowest reading has been about 68 He was told to leave off Amaryl at lunch if he was not eating a meal He has done well with his exercise regimen with either walking or going to the gym almost every day and may be walking more than 2 miles at  times   Dinner usually around 8 pm   Side effects from medications: bloating, gas and GI distress from metformin, even 500 mg, balanitis from Invokana and Jardiance  Glucometer: ACCU-CHEK.    PRE-MEAL Fasting Lunch Dinner Bedtime Overall  Glucose range: 96, 121 133-2 18 108-186    Mean/median:     134   POST-MEAL PC Breakfast PC Lunch PC Dinner  Glucose range:   ?  Mean/median:      Previously:  PRE-MEAL Fasting Lunch Dinner Bedtime Overall  Glucose range:   58-96 111   Mean/median:     118   POST-MEAL PC Breakfast PC Lunch PC Dinner  Glucose range: 121-178    Mean/median:       Dietician visit: Most recent: 1997 at diagnosis.  .   Wt Readings from Last 3 Encounters:  06/28/21 166 lb (75.3 kg)  03/31/21 170 lb 12.8 oz (77.5 kg)  01/27/21 167 lb 3.2 oz (75.8 kg)    Lab Results  Component Value Date   HGBA1C 6.1 10/26/2021   HGBA1C 6.0 06/26/2021   HGBA1C 6.7 (H) 01/25/2021   Lab Results  Component Value Date   MICROALBUR 2.6 (H) 10/26/2021   LDLCALC 93 10/26/2021   CREATININE 1.17 10/26/2021    OTHER active problems: See review of systems   No visits with results within 1 Week(s) from this visit.  Latest known visit with  results is:  Lab on 10/26/2021  Component Date Value Ref Range Status   Cholesterol 10/26/2021 164  0 - 200 mg/dL Final   ATP III Classification       Desirable:  < 200 mg/dL               Borderline High:  200 - 239 mg/dL          High:  > = 240 mg/dL   Triglycerides 10/26/2021 74.0  0.0 - 149.0 mg/dL Final   Normal:  <150 mg/dLBorderline High:  150 - 199 mg/dL   HDL 10/26/2021 55.50  >39.00 mg/dL Final   VLDL 10/26/2021 14.8  0.0 - 40.0 mg/dL Final   LDL Cholesterol 10/26/2021 93  0 - 99 mg/dL Final   Total CHOL/HDL Ratio 10/26/2021 3   Final                  Men          Women1/2 Average Risk     3.4          3.3Average Risk          5.0          4.42X Average Risk          9.6          7.13X Average Risk          15.0          11.0                        NonHDL 10/26/2021 108.19   Final   NOTE:  Non-HDL goal should be 30 mg/dL higher than patient's LDL goal (i.e. LDL goal of < 70 mg/dL, would have non-HDL goal of < 100 mg/dL)   Microalb, Ur 10/26/2021 2.6 (H)  0.0 - 1.9 mg/dL Final   Creatinine,U 10/26/2021 290.7  mg/dL Final   Microalb Creat Ratio 10/26/2021 0.9  0.0 - 30.0 mg/g Final   Sodium 10/26/2021 140  135 - 145 mEq/L Final   Potassium 10/26/2021 4.0  3.5 - 5.1 mEq/L Final   Chloride 10/26/2021 105  96 - 112 mEq/L Final   CO2 10/26/2021 29  19 - 32 mEq/L Final   Glucose, Bld 10/26/2021 213 (H)  70 - 99 mg/dL Final   BUN 10/26/2021 20  6 - 23 mg/dL Final   Creatinine, Ser 10/26/2021 1.17  0.40 - 1.50 mg/dL Final   Total Bilirubin 10/26/2021 0.5  0.2 - 1.2 mg/dL Final   Alkaline Phosphatase 10/26/2021 71  39 - 117 U/L Final   AST 10/26/2021 16  0 - 37 U/L Final   ALT 10/26/2021 17  0 - 53 U/L Final   Total Protein 10/26/2021 7.0  6.0 - 8.3 g/dL Final   Albumin 10/26/2021 4.2  3.5 - 5.2 g/dL Final   GFR 10/26/2021 61.48  >60.00 mL/min Final   Calculated using the CKD-EPI Creatinine Equation (2021)   Calcium 10/26/2021 9.1  8.4 - 10.5 mg/dL Final   Hgb A1c MFr Bld 10/26/2021 6.1  4.6 - 6.5 % Final   Glycemic Control Guidelines for People with Diabetes:Non Diabetic:  <6%Goal of Therapy: <7%Additional Action Suggested:  >8%     Allergies as of 11/17/2021       Reactions   Metformin Diarrhea, Other (See Comments)        Medication List        Accurate as of November 16, 2021  8:48 PM. If you have any questions, ask your nurse or doctor.          Accu-Chek Guide test strip Generic drug: glucose blood Use Accu Chek Guide test strips as instructed to check blood sugar once daily.   Accu-Chek Guide test strip Generic drug: glucose blood USE TO CHECK BLOOD SUGARS THREE TIMES DAILY.   Accu-Chek Guide w/Device Kit 1 each by Does not apply route daily. Use accu chek guide meter to check blood sugar  once daily.   Accu-Chek Softclix Lancets lancets 3 times daily As directed DXE11.65   alfuzosin 10 MG 24 hr tablet Commonly known as: UROXATRAL Take 10 mg by mouth daily.   Alogliptin-Pioglitazone 25-30 MG Tabs Take 1 tablet by mouth daily.   ALPRAZolam 0.5 MG tablet Commonly known as: XANAX Take 0.5 mg by mouth every other day as needed. Take 1/2 tablet by mouth every other day as needed.   atorvastatin 80 MG tablet Commonly known as: LIPITOR Take 1 tablet (80 mg total) by mouth daily.   buPROPion 300 MG 24 hr tablet Commonly known as: WELLBUTRIN XL Indications: anxiousness associated with depression. Take 1 tablet by mouth every day   finasteride 5 MG tablet Commonly known as: PROSCAR   glimepiride 4 MG tablet Commonly known as: AMARYL TAKE 1/2 AMARYL AT LUNCH IF EATING A MEAL, OTHERWISE SKIP   hydrocortisone-pramoxine 2.5-1 % rectal cream Commonly known as: ANALPRAM-HC   mometasone 50 MCG/ACT nasal spray Commonly known as: NASONEX USE 2 SPRAYS IN EACH NOSTRIL NASALLY ONCE A DAY AS NEEDED   omeprazole 40 MG capsule Commonly known as: PRILOSEC as needed.   Rybelsus 3 MG Tabs Generic drug: Semaglutide Take 3 mg by mouth daily before breakfast.   Rybelsus 7 MG Tabs Generic drug: Semaglutide TAKE 1 TABLET DAILY BEFORE BREAKFAST WITH HALF GLASS OF WATER. START AFTER 3MG RUNS OUT. (DO NOT FILL UNTIL 3MG RUNS OUT)   tadalafil 5 MG tablet Commonly known as: CIALIS Take 5 mg by mouth daily as needed for erectile dysfunction.   Vilazodone HCl 20 MG Tabs Indications: major depressive disorder. Take 1 tablet by mouth every day        Allergies:  Allergies  Allergen Reactions   Metformin Diarrhea and Other (See Comments)    Past Medical History:  Diagnosis Date   Arthritis    Diabetes mellitus without complication ([place])    Hypercholesterolemia    Hypertension     Past Surgical History:  Procedure Laterality Date   LAPARASCOPIC MEDICAN ARCUATE LIGAMENT  RELEASE     MINOR HEMORRHOIDECTOMY N/A     Family History  Problem Relation Age of Onset   Cancer Neg Hx     Social History:  reports that he has quit smoking. His smoking use included cigarettes. He has never used smokeless tobacco. He reports current alcohol use. He reports that he does not use drugs.  Review of Systems:    HYPERLIPIDEMIA: Followed by PCP No history of CAD Has been taking 80 mg Lipitor and LDL as follows   Lab Results  Component Value Date   CHOL 164 10/26/2021   CHOL 164 10/17/2020   CHOL 170 07/19/2020   Lab Results  Component Value Date   HDL 55.50 10/26/2021   HDL 52.80 10/17/2020   HDL 56.60 07/19/2020   Lab Results  Component Value Date   LDLCALC 93 10/26/2021   LDLCALC 97 10/17/2020   LDLCALC 103 (H) 07/19/2020   Lab Results  Component Value Date  TRIG 74.0 10/26/2021   TRIG 72.0 10/17/2020   TRIG 52.0 07/19/2020   Lab Results  Component Value Date   CHOLHDL 3 10/26/2021   CHOLHDL 3 10/17/2020   CHOLHDL 3 07/19/2020   Lab Results  Component Value Date   LDLDIRECT 134.6 11/18/2013      Diabetic foot exam in 6/21 showed normal monofilament sensation in the toes and plantar surfaces, no skin lesions or ulcers on the feet and normal pedal pulses  No history of hypertension or microalbuminuria   BP Readings from Last 3 Encounters:  06/28/21 138/62  03/31/21 140/80  01/27/21 130/64   His annual eye exam was in 4/23    Examination:   There were no vitals taken for this visit.  There is no height or weight on file to calculate BMI.      Assesment/Plan:   Diabetes type 2 without significant obesity  See history of present illness for detailed discussion of current diabetes management, blood sugar patterns and problems identified  His A1c is 6.0  Now taking Rybelsus 7 mg with his Oseni and Amaryl 4 mg  He has had continued improvement with taking Rybelsus Also doing very well with exercise regimen Only appears to  have high blood sugars if eating large amount of carbohydrate at breakfast However not checking enough readings after dinner or lunch  Recommendations: He will cut back on number of carbohydrates at breakfast Make sure he has a snack available if he is not able to find a time for eating lunch  Check 2-hour postprandial blood sugars by rotation at different times Continue exercise regimen    There are no Patient Instructions on file for this visit.   Elayne Snare 11/16/2021, 8:48 PM

## 2021-11-17 ENCOUNTER — Ambulatory Visit: Payer: Federal, State, Local not specified - PPO | Admitting: Endocrinology

## 2021-11-17 DIAGNOSIS — E1165 Type 2 diabetes mellitus with hyperglycemia: Secondary | ICD-10-CM

## 2021-12-19 ENCOUNTER — Encounter: Payer: Self-pay | Admitting: Endocrinology

## 2021-12-19 DIAGNOSIS — E1165 Type 2 diabetes mellitus with hyperglycemia: Secondary | ICD-10-CM

## 2021-12-19 MED ORDER — GLIMEPIRIDE 4 MG PO TABS: ORAL_TABLET | ORAL | 0 refills | Status: DC

## 2022-02-05 ENCOUNTER — Other Ambulatory Visit: Payer: Self-pay | Admitting: Endocrinology

## 2022-02-09 ENCOUNTER — Encounter (HOSPITAL_BASED_OUTPATIENT_CLINIC_OR_DEPARTMENT_OTHER): Payer: Self-pay | Admitting: Emergency Medicine

## 2022-02-09 ENCOUNTER — Emergency Department (HOSPITAL_BASED_OUTPATIENT_CLINIC_OR_DEPARTMENT_OTHER)
Admission: EM | Admit: 2022-02-09 | Discharge: 2022-02-09 | Discharge status: Against Medical Advice | Payer: Federal, State, Local not specified - PPO | Attending: Emergency Medicine | Admitting: Emergency Medicine

## 2022-02-09 ENCOUNTER — Other Ambulatory Visit: Payer: Self-pay

## 2022-02-09 DIAGNOSIS — Z5321 Procedure and treatment not carried out due to patient leaving prior to being seen by health care provider: Secondary | ICD-10-CM | POA: Diagnosis not present

## 2022-02-09 DIAGNOSIS — R1032 Left lower quadrant pain: Secondary | ICD-10-CM | POA: Diagnosis present

## 2022-02-09 LAB — URINALYSIS, ROUTINE W REFLEX MICROSCOPIC
Bilirubin Urine: NEGATIVE
Glucose, UA: NEGATIVE mg/dL
Hgb urine dipstick: NEGATIVE
Ketones, ur: NEGATIVE mg/dL
Nitrite: NEGATIVE
Protein, ur: NEGATIVE mg/dL
Specific Gravity, Urine: 1.016 (ref 1.005–1.030)
pH: 5 (ref 5.0–8.0)

## 2022-02-09 LAB — LIPASE, BLOOD: Lipase: 18 U/L (ref 11–51)

## 2022-02-09 LAB — COMPREHENSIVE METABOLIC PANEL
ALT: 15 U/L (ref 0–44)
AST: 14 U/L — ABNORMAL LOW (ref 15–41)
Albumin: 4.5 g/dL (ref 3.5–5.0)
Alkaline Phosphatase: 65 U/L (ref 38–126)
Anion gap: 10 (ref 5–15)
BUN: 18 mg/dL (ref 8–23)
CO2: 27 mmol/L (ref 22–32)
Calcium: 9.7 mg/dL (ref 8.9–10.3)
Chloride: 104 mmol/L (ref 98–111)
Creatinine, Ser: 1 mg/dL (ref 0.61–1.24)
GFR, Estimated: >60 mL/min (ref >60)
Glucose, Bld: 95 mg/dL (ref 70–99)
Potassium: 3.8 mmol/L (ref 3.5–5.1)
Sodium: 141 mmol/L (ref 135–145)
Total Bilirubin: 0.6 mg/dL (ref 0.3–1.2)
Total Protein: 7.4 g/dL (ref 6.5–8.1)

## 2022-02-09 LAB — CBC
HCT: 40 % (ref 39.0–52.0)
Hemoglobin: 13.2 g/dL (ref 13.0–17.0)
MCH: 29.7 pg (ref 26.0–34.0)
MCHC: 33 g/dL (ref 30.0–36.0)
MCV: 89.9 fL (ref 80.0–100.0)
Platelets: 146 10*3/uL — ABNORMAL LOW (ref 150–400)
RBC: 4.45 MIL/uL (ref 4.22–5.81)
RDW: 13 % (ref 11.5–15.5)
WBC: 4.4 10*3/uL (ref 4.0–10.5)
nRBC: 0 % (ref 0.0–0.2)

## 2022-02-09 NOTE — ED Notes (Signed)
Pt not found in waiting room nor surrounding areas

## 2022-02-09 NOTE — ED Notes (Signed)
Called to reassess vitals, no answer. Will try again.

## 2022-02-09 NOTE — ED Triage Notes (Signed)
Pt presents to ED POV. Pt c/o LLQ abd pain x46m ago. Pt reports that ir was intermittent and now is constant. No GI/GU s/s

## 2022-02-10 ENCOUNTER — Emergency Department (HOSPITAL_BASED_OUTPATIENT_CLINIC_OR_DEPARTMENT_OTHER)
Admission: EM | Admit: 2022-02-10 | Discharge: 2022-02-10 | Disposition: A | Discharge status: Home / Self Care | Payer: Federal, State, Local not specified - PPO | Attending: Emergency Medicine | Admitting: Emergency Medicine

## 2022-02-10 ENCOUNTER — Encounter (HOSPITAL_BASED_OUTPATIENT_CLINIC_OR_DEPARTMENT_OTHER): Payer: Self-pay | Admitting: Emergency Medicine

## 2022-02-10 ENCOUNTER — Emergency Department (HOSPITAL_BASED_OUTPATIENT_CLINIC_OR_DEPARTMENT_OTHER): Payer: Federal, State, Local not specified - PPO

## 2022-02-10 DIAGNOSIS — R1032 Left lower quadrant pain: Secondary | ICD-10-CM

## 2022-02-10 LAB — COMPREHENSIVE METABOLIC PANEL
ALT: 12 U/L (ref 0–44)
AST: 12 U/L — ABNORMAL LOW (ref 15–41)
Albumin: 4.2 g/dL (ref 3.5–5.0)
Alkaline Phosphatase: 61 U/L (ref 38–126)
Anion gap: 8 (ref 5–15)
BUN: 23 mg/dL (ref 8–23)
CO2: 25 mmol/L (ref 22–32)
Calcium: 9.3 mg/dL (ref 8.9–10.3)
Chloride: 107 mmol/L (ref 98–111)
Creatinine, Ser: 1.09 mg/dL (ref 0.61–1.24)
GFR, Estimated: >60 mL/min (ref >60)
Glucose, Bld: 115 mg/dL — ABNORMAL HIGH (ref 70–99)
Potassium: 3.9 mmol/L (ref 3.5–5.1)
Sodium: 140 mmol/L (ref 135–145)
Total Bilirubin: 0.5 mg/dL (ref 0.3–1.2)
Total Protein: 6.8 g/dL (ref 6.5–8.1)

## 2022-02-10 LAB — CBC
HCT: 37.6 % — ABNORMAL LOW (ref 39.0–52.0)
Hemoglobin: 12.5 g/dL — ABNORMAL LOW (ref 13.0–17.0)
MCH: 30 pg (ref 26.0–34.0)
MCHC: 33.2 g/dL (ref 30.0–36.0)
MCV: 90.4 fL (ref 80.0–100.0)
Platelets: 137 10*3/uL — ABNORMAL LOW (ref 150–400)
RBC: 4.16 MIL/uL — ABNORMAL LOW (ref 4.22–5.81)
RDW: 13.2 % (ref 11.5–15.5)
WBC: 4.1 10*3/uL (ref 4.0–10.5)
nRBC: 0 % (ref 0.0–0.2)

## 2022-02-10 LAB — URINALYSIS, ROUTINE W REFLEX MICROSCOPIC
Bilirubin Urine: NEGATIVE
Glucose, UA: NEGATIVE mg/dL
Hgb urine dipstick: NEGATIVE
Ketones, ur: NEGATIVE mg/dL
Nitrite: NEGATIVE
Protein, ur: NEGATIVE mg/dL
Specific Gravity, Urine: 1.016 (ref 1.005–1.030)
pH: 6.5 (ref 5.0–8.0)

## 2022-02-10 LAB — LIPASE, BLOOD: Lipase: 21 U/L (ref 11–51)

## 2022-02-10 NOTE — ED Provider Notes (Signed)
[facility] EMERGENCY DEPT Provider Note   CSN: [id number] Arrival date & time: 02/10/22  4097     History  Chief Complaint  Patient presents with   Abdominal Pain    Kenneth Lawson is a 76 y.o. male.  HPI Patient reports he has had about a month of intermittent left lower abdominal pain.  Initially it was fairly mild and just twinges of pain.  Over the past days to weeks it has become more persistent and then increased pain.  Times now worse with movements.  No radiation to the testicle or the back.  Intermittently sharp or burning in quality.  No fevers no vomiting no diarrhea.  Patient reports he does have history of irritable bowel.  He had a colonoscopy within the last year and does have known diverticulosis.  No history of colon cancer.  He has had a benign polyp removed.  Patient reports at baseline he has a slow urinary stream due to prostate enlargement.  No recent pain or burning with urination.    Home Medications Prior to Admission medications   Medication Sig Start Date End Date Taking? Authorizing Provider  ACCU-CHEK GUIDE test strip USE TO CHECK BLOOD SUGARS THREE TIMES DAILY. 10/17/20   Elayne Snare, MD  Accu-Chek Softclix Lancets lancets 3 times daily As directed DXE11.65 04/11/21   Shamleffer, Melanie Crazier, MD  alfuzosin (UROXATRAL) 10 MG 24 hr tablet Take 10 mg by mouth daily.    [provider]  Alogliptin-Pioglitazone 25-30 MG TABS Take 1 tablet by mouth daily. 05/03/21   Elayne Snare, MD  ALPRAZolam Duanne Moron) 0.5 MG tablet Take 0.5 mg by mouth every other day as needed. Take 1/2 tablet by mouth every other day as needed. 10/20/13   [provider]  atorvastatin (LIPITOR) 80 MG tablet Take 1 tablet (80 mg total) by mouth daily. 11/03/21   Elayne Snare, MD  Blood Glucose Monitoring Suppl (ACCU-CHEK GUIDE) w/Device KIT 1 each by Does not apply route daily. Use accu chek guide meter to check blood sugar once daily. 01/12/21   Elayne Snare, MD   buPROPion (WELLBUTRIN XL) 300 MG 24 hr tablet Indications: anxiousness associated with depression. Take 1 tablet by mouth every day 03/31/20   [provider]  finasteride (PROSCAR) 5 MG tablet  01/20/14   [provider]  glimepiride (AMARYL) 4 MG tablet TAKE 1 TABLET AT BREAKFAST AND TAKE 1/2  AT LUNCH IF EATING A MEAL, OTHERWISE SKIP 12/19/21   Elayne Snare, MD  glucose blood (ACCU-CHEK GUIDE) test strip Use Accu Chek Guide test strips as instructed to check blood sugar once daily. 06/23/19   Elayne Snare, MD  hydrocortisone-pramoxine [facility]) 2.5-1 % rectal cream  03/12/13   [provider]  mometasone (NASONEX) 50 MCG/ACT nasal spray USE 2 SPRAYS IN EACH NOSTRIL NASALLY ONCE A DAY AS NEEDED 09/11/14   [provider]  omeprazole (PRILOSEC) 40 MG capsule as needed. 10/22/12   [provider]  RYBELSUS 7 MG TABS TAKE 1 TABLET DAILY BEFORE BREAKFAST WITH HALF GLASS OF WATER. START AFTER 3MG RUNS OUT. (DO NOT FILL UNTIL 3MG RUNS OUT) 02/06/22   Elayne Snare, MD  Semaglutide (RYBELSUS) 3 MG TABS Take 3 mg by mouth daily before breakfast. 01/27/21   Elayne Snare, MD  tadalafil (CIALIS) 5 MG tablet Take 5 mg by mouth daily as needed for erectile dysfunction.    [provider]  Vilazodone HCl 20 MG TABS Indications: major depressive disorder. Take 1 tablet by mouth every  day    [provider]      Allergies    Metformin    Review of Systems   Review of Systems  Physical Exam Updated Vital Signs BP (!) 171/86   Pulse 75   Temp 98 F (36.7 C)   Resp 16   Ht _0  (1.727 m)   Wt 74.8 kg   SpO2 100%   BMI 25.09 kg/m  Physical Exam Constitutional:      Comments: Alert nontoxic clinically well in appearance.  HENT:     Mouth/Throat:     Pharynx: Oropharynx is clear.  Eyes:     Extraocular Movements: Extraocular movements intact.  Cardiovascular:     Rate and Rhythm: Normal rate and regular rhythm.  Pulmonary:     Effort:  Pulmonary effort is normal.     Breath sounds: Normal breath sounds.  Abdominal:     Comments: Abdomen soft without guarding.  Focal area of tenderness in the left lower abdomen.  No rashes or abdominal wall changes.  Left inguinal region nontender.  No mass or fullness in the inguinal region.  Femoral pulses 2+ and strong.  Musculoskeletal:        General: No swelling or tenderness. Normal range of motion.     Right lower leg: No edema.     Left lower leg: No edema.  Skin:    General: Skin is warm and dry.  Neurological:     General: No focal deficit present.     Mental Status: He is oriented to person, place, and time.     Coordination: Coordination normal.  Psychiatric:        Mood and Affect: Mood normal.     ED Results / Procedures / Treatments   Labs (all labs ordered are listed, but only abnormal results are displayed) Labs Reviewed  COMPREHENSIVE METABOLIC PANEL - Abnormal; Notable for the following components:      Result Value   Glucose, Bld 115 (*)    AST 12 (*)    All other components within normal limits  CBC - Abnormal; Notable for the following components:   RBC 4.16 (*)    Hemoglobin 12.5 (*)    HCT 37.6 (*)    Platelets 137 (*)    All other components within normal limits  URINALYSIS, ROUTINE W REFLEX MICROSCOPIC - Abnormal; Notable for the following components:   Leukocytes,Ua SMALL (*)    All other components within normal limits  LIPASE, BLOOD    EKG None  Radiology CT Renal Stone Study  Result Date: 02/10/2022 CLINICAL DATA:  Left lower quadrant pain for 3 weeks EXAM: CT ABDOMEN AND PELVIS WITHOUT CONTRAST TECHNIQUE: Multidetector CT imaging of the abdomen and pelvis was performed following the standard protocol without IV contrast. RADIATION DOSE REDUCTION: This exam was performed according to the departmental dose-optimization program which includes automated exposure control, adjustment of the mA and/or kV according to patient size and/or use of  iterative reconstruction technique. COMPARISON:  None Available. FINDINGS: Lower chest: 4 mm left lower lobe pulmonary nodule (series 4, image 10). Included lung bases are otherwise clear. Heart size is normal. Hepatobiliary: Unremarkable unenhanced appearance of the liver. No liver lesion is identified. There are a few stones noted dependently within the gallbladder lumen. No gallbladder wall thickening or pericholecystic inflammatory changes by CT. Pancreas: Unremarkable. No pancreatic ductal dilatation or surrounding inflammatory changes. Spleen: Normal in size without focal abnormality. Adrenals/Urinary Tract: Unremarkable adrenal glands. 3.2 cm cyst at the  lower pole of the right kidney which does not require follow-up imaging. No solid renal lesion, stone, or hydronephrosis. Ureters are unremarkable. No ureteral calculi. Mild circumferential wall thickening of the urinary bladder. Stomach/Bowel: Stomach is within normal limits. Appendix appears normal. Colonic diverticulosis, most pronounced within the sigmoid colon. No evidence of bowel wall thickening, distention, or inflammatory changes. Vascular/Lymphatic: Aortic atherosclerosis. No enlarged abdominal or pelvic lymph nodes. Reproductive: Mildly enlarged prostate gland. Other: No free fluid. No abdominopelvic fluid collection. No pneumoperitoneum. Tiny fat containing umbilical hernia. Musculoskeletal: No acute or significant osseous findings. Degenerative disc disease of L4-5 and L5-S1. Osteoarthritis of the bilateral hips, right worse than left. IMPRESSION: 1. No acute abdominopelvic findings. 2. Colonic diverticulosis without evidence of acute diverticulitis. 3. Cholelithiasis without evidence of acute cholecystitis. 4. Mild circumferential wall thickening of the urinary bladder, which may be secondary to chronic bladder outlet obstruction given mildly enlarged prostate gland. Correlate with urinalysis to exclude cystitis. 5. Incidentally noted 4 mm left  lower lobe pulmonary nodule. No follow-up needed if patient is low-risk. This recommendation follows the consensus statement: Guidelines for Management of Incidental Pulmonary Nodules Detected on CT Images: From the Fleischner Society 2017; Radiology 2017; 284:228-243. 6. Aortic atherosclerosis (ICD10-I70.0). Electronically Signed   By: Davina Poke D.O.   On: 02/10/2022 09:42    Procedures Procedures    Medications Ordered in ED Medications - No data to display  ED Course/ Medical Decision Making/ A&P                           Medical Decision Making Amount and/or Complexity of Data Reviewed Labs: ordered. Radiology: ordered.   Patient had left lower quadrant pain worsening.  Known history of diverticulosis by endoscopy.  Will proceed with CT scan.  Differential diagnosis also includes kidney stone/UTI/bowel obstruction.  He is nontoxic and alert.  Urinalysis negative.  No significant leukocytosis.  Normal renal function.  CT scan without acute findings.  At this time with patient otherwise well in appearance, acute diverticulitis, retained kidney stone and bowel obstruction are ruled out.  Physical exam does not show any abdominal wall soft tissue abnormalities or inguinal hernia.  At this time stable for discharge.  Patient has good follow-up in place.  Careful return precautions reviewed.        Final Clinical Impression(s) / ED Diagnoses Final diagnoses:  Left lower quadrant abdominal pain    Rx / DC Orders ED Discharge Orders     None         Charlesetta Shanks, MD 02/10/22 1136

## 2022-02-10 NOTE — ED Triage Notes (Signed)
Pt arrives to ED with c/o LLQ abdominal pain x3 weeks.

## 2022-03-01 ENCOUNTER — Telehealth: Payer: Self-pay

## 2022-03-01 NOTE — Telephone Encounter (Signed)
Patient called states that [employer] is requesting a PA for Rybelsus for this year.

## 2022-03-06 ENCOUNTER — Other Ambulatory Visit (HOSPITAL_COMMUNITY): Payer: Self-pay

## 2022-03-06 ENCOUNTER — Telehealth: Payer: Self-pay | Admitting: Pharmacy Technician

## 2022-03-06 NOTE — Telephone Encounter (Signed)
Pharmacy Patient Advocate Encounter   Received notification from CMA/pt msg that prior authorization for Rybelsus is required/requested.  Per Test Claim: too soon to fill. Last filled 02/06/22 for a 3 month supply. Next fill 04/15/22.   Started PA on 03/06/22 to (ins) [place] via [employer]  [id number]  Has the patient tried and failed or been unable to tolerate an injectable GLP-1 agonist product (e.g., Byetta/Bydureon, Trulicity or Victoza)? (I answered NO, b/c I didn't find mention of any of these in his chart)  Is the patient unable to use injectable products due to physical limitations, visual impairments, needle-phobia or other clinical reason?

## 2022-03-12 ENCOUNTER — Other Ambulatory Visit (HOSPITAL_COMMUNITY): Payer: Self-pay

## 2022-03-12 NOTE — Telephone Encounter (Signed)
I apologize, but I misread about the trulicity and didn't finish the PA for Rybelsus.  However, when I just attempted to I found that I have to call for his ins. They are closed today, so I'll try again tomorrow.

## 2022-03-13 ENCOUNTER — Other Ambulatory Visit (HOSPITAL_COMMUNITY): Payer: Self-pay

## 2022-03-13 NOTE — Telephone Encounter (Signed)
Pharmacy Patient Advocate Encounter  Prior Authorization for Rybelsus has been approved.    Should receive a fax confirmation. Effective dates: 03/13/22 through 03/13/2023  Per test claim, Filled 02/06/22. Nests fill should go through with no problems.

## 2022-03-14 NOTE — Telephone Encounter (Signed)
Called & left vm for patient.

## 2022-04-20 ENCOUNTER — Encounter: Payer: Self-pay | Admitting: Endocrinology

## 2022-04-20 ENCOUNTER — Ambulatory Visit: Payer: Federal, State, Local not specified - PPO | Admitting: Endocrinology

## 2022-04-20 VITALS — BP 146/72 | HR 73 | Ht 68.0 in | Wt 169.6 lb

## 2022-04-20 DIAGNOSIS — E1165 Type 2 diabetes mellitus with hyperglycemia: Secondary | ICD-10-CM

## 2022-04-20 LAB — POCT GLYCOSYLATED HEMOGLOBIN (HGB A1C): Hemoglobin A1C: 6.2 % — AB (ref 4.0–5.6)

## 2022-04-20 NOTE — Progress Notes (Signed)
Patient ID: Kenneth Lawson, male   DOB: [birth year], 77 y.o.   MRN: [id number]   Reason for Appointment: Diabetes follow-up    History of Present Illness    Diagnosis: date of diagnosis: 1997.   Previous history: He was previously treated with glyburide, Actos and metformin.  Metformin was stopped because he had GI side effects  His blood sugars had been relatively high in the last 3-4 years and A1c had been consistently over 7.5% until his initial consultation He was started on Invokana in 10/13 which had helped modestly. His blood sugars had shown considerable improvement with  adding  Nesina since 2/14 .    He was also on Actos which he had stopped but blood sugars did not appear to worsen. .  The previous HbgA1c levels: 6.4 in 4/14,  7.4 in 12/13, prior range from 7.8-8.3 since 2011   RECENT history:   Oral hypoglycemic drugs: Oseni 25/30,   Amaryl 4 mg after breakfast, Rybelsus 7 mg daily before breakfast  A1c is 6.2, stable   Current blood sugar patterns, management and problems identified:  He has not been seen since 5/23 Most of his blood sugar monitoring has been in the afternoon and very sporadic He has been able to get his Rybelsus consistently now and taking it without any nausea Usually taking it at least 30 minutes before eating Most of the time he takes his Amaryl right after breakfast However he still has some tendency to low sugars in the afternoon or evening as low as 63 Not clear what his fasting readings are at home Generally blood sugars tend to be higher after breakfast although he is trying to get some protein in the form of cheese or lean meat in the morning He has done well with his exercise regimen with either walking or going to the gym almost every day and may be walking more than 2 miles at times   Dinner usually around 8 pm   Side effects from medications: bloating, gas and GI distress from metformin, even 500 mg, balanitis from Invokana  and Jardiance  Glucometer: ACCU-CHEK.  Blood sugars from download: 30-day printout  PRE-MEAL Fasting Lunch Dinner Bedtime Overall  Glucose range: ?      Mean/median:     110   POST-MEAL PC Breakfast PC Lunch PC Dinner  Glucose range: 196 63-118   Mean/median:      Prior  PRE-MEAL Fasting Lunch Dinner Bedtime Overall  Glucose range: 96, 121 133-2 18 108-186    Mean/median:     134   POST-MEAL PC Breakfast PC Lunch PC Dinner  Glucose range:   ?  Mean/median:      Dietician visit: Most recent: 1997 at diagnosis.  .   Wt Readings from Last 3 Encounters:  04/20/22 169 lb 9.6 oz (76.9 kg)  02/10/22 165 lb (74.8 kg)  06/28/21 166 lb (75.3 kg)    Lab Results  Component Value Date   HGBA1C 6.2 (A) 04/20/2022   HGBA1C 6.1 10/26/2021   HGBA1C 6.0 06/26/2021   Lab Results  Component Value Date   MICROALBUR 2.6 (H) 10/26/2021   LDLCALC 93 10/26/2021   CREATININE 1.09 02/10/2022    OTHER active problems: See review of systems   Office Visit on 04/20/2022  Component Date Value Ref Range Status   Hemoglobin A1C 04/20/2022 6.2 (A)  4.0 - 5.6 % Final    Allergies as of 04/20/2022       Reactions  Metformin Diarrhea, Other (See Comments)        Medication List        Accurate as of April 20, 2022 11:14 AM. If you have any questions, ask your nurse or doctor.          STOP taking these medications    Accu-Chek Guide w/Device Kit Stopped by: Elayne Snare, MD   hydrocortisone-pramoxine 2.5-1 % rectal cream Commonly known as: ANALPRAM-HC Stopped by: Elayne Snare, MD   omeprazole 40 MG capsule Commonly known as: PRILOSEC Stopped by: Elayne Snare, MD       TAKE these medications    Accu-Chek Guide test strip Generic drug: glucose blood USE TO CHECK BLOOD SUGARS THREE TIMES DAILY. What changed: Another medication with the same name was removed. Continue taking this medication, and follow the directions you see here. Changed by: Elayne Snare, MD    Accu-Chek Softclix Lancets lancets 3 times daily As directed DXE11.65   alfuzosin 10 MG 24 hr tablet Commonly known as: UROXATRAL Take 10 mg by mouth daily.   Alogliptin-Pioglitazone 25-30 MG Tabs Take 1 tablet by mouth daily.   ALPRAZolam 0.5 MG tablet Commonly known as: XANAX Take 0.5 mg by mouth every other day as needed. Take 1/2 tablet by mouth every other day as needed.   atorvastatin 80 MG tablet Commonly known as: LIPITOR Take 1 tablet (80 mg total) by mouth daily.   buPROPion 300 MG 24 hr tablet Commonly known as: WELLBUTRIN XL Indications: anxiousness associated with depression. Take 1 tablet by mouth every day   finasteride 5 MG tablet Commonly known as: PROSCAR   glimepiride 4 MG tablet Commonly known as: AMARYL TAKE 1 TABLET AT BREAKFAST AND TAKE 1/2  AT LUNCH IF EATING A MEAL, OTHERWISE SKIP   mometasone 50 MCG/ACT nasal spray Commonly known as: NASONEX USE 2 SPRAYS IN EACH NOSTRIL NASALLY ONCE A DAY AS NEEDED   Rybelsus 7 MG Tabs Generic drug: Semaglutide TAKE 1 TABLET DAILY BEFORE BREAKFAST WITH HALF GLASS OF WATER. START AFTER '3MG'$  RUNS OUT. (DO NOT FILL UNTIL '3MG'$  RUNS OUT) What changed: Another medication with the same name was removed. Continue taking this medication, and follow the directions you see here. Changed by: Elayne Snare, MD   tadalafil 5 MG tablet Commonly known as: CIALIS Take 5 mg by mouth daily as needed for erectile dysfunction.   Viberzi 75 MG Tabs Generic drug: Eluxadoline Take by mouth 2 (two) times daily.   Vilazodone HCl 20 MG Tabs Indications: major depressive disorder. Take 1 tablet by mouth every day        Allergies:  Allergies  Allergen Reactions   Metformin Diarrhea and Other (See Comments)    Past Medical History:  Diagnosis Date   Arthritis    Diabetes mellitus without complication ([place])    Hypercholesterolemia    Hypertension     Past Surgical History:  Procedure Laterality Date   LAPARASCOPIC  MEDICAN ARCUATE LIGAMENT RELEASE     MINOR HEMORRHOIDECTOMY N/A     Family History  Problem Relation Age of Onset   Cancer Neg Hx     Social History:  reports that he has quit smoking. His smoking use included cigarettes. He has never used smokeless tobacco. He reports current alcohol use. He reports that he does not use drugs.  Review of Systems:    HYPERLIPIDEMIA: Followed by PCP No history of CAD Has been taking 80 mg Lipitor and LDL as follows PCP checked his LDL in January: 90  Lab Results  Component Value Date   CHOL 164 10/26/2021   CHOL 164 10/17/2020   CHOL 170 07/19/2020   Lab Results  Component Value Date   HDL 55.50 10/26/2021   HDL 52.80 10/17/2020   HDL 56.60 07/19/2020   Lab Results  Component Value Date   LDLCALC 93 10/26/2021   LDLCALC 97 10/17/2020   LDLCALC 103 (H) 07/19/2020   Lab Results  Component Value Date   TRIG 74.0 10/26/2021   TRIG 72.0 10/17/2020   TRIG 52.0 07/19/2020   Lab Results  Component Value Date   CHOLHDL 3 10/26/2021   CHOLHDL 3 10/17/2020   CHOLHDL 3 07/19/2020   Lab Results  Component Value Date   LDLDIRECT 134.6 11/18/2013      Diabetic foot exam in 2/24 showed normal monofilament sensation in the toes and plantar surfaces, no skin lesions or ulcers on the feet and normal pedal pulses  He was told by his PCP to follow-up for possible hypertension   BP Readings from Last 3 Encounters:  04/20/22 (!) 146/72  02/10/22 (!) 149/79  02/09/22 (!) 171/74   His annual eye exam was in 4/23    Examination:   BP (!) 146/72 (BP Location: Left Arm, Patient Position: Sitting, Cuff Size: Normal)   Pulse 73   Ht '5\' 8"'$  (1.727 m)   Wt 169 lb 9.6 oz (76.9 kg)   SpO2 95%   BMI 25.79 kg/m   Body mass index is 25.79 kg/m.    Diabetic Foot Exam - Simple   Simple Foot Form Diabetic Foot exam was performed with the following findings: Yes   Visual Inspection No deformities, no ulcerations, no other skin breakdown  bilaterally: Yes Sensation Testing Intact to touch and monofilament testing bilaterally: Yes Pulse Check Posterior Tibialis and Dorsalis pulse intact bilaterally: Yes Comments      Assesment/Plan:   Diabetes type 2 without significant obesity  See history of present illness for detailed discussion of current diabetes management, blood sugar patterns and problems identified  His A1c is 6.2  Continues to be taking Rybelsus 7 mg with his Oseni and Amaryl 4 mg  Blood sugars are very well-controlled He has been consistent with exercise regimen Blood sugar monitoring has been inadequate May have some higher readings after breakfast as before if eating more carbohydrate However since his blood sugars are as low as 60 3 in the afternoon he can reduce his Amaryl to half tablet in the morning He will try to take this before eating To also check readings after breakfast and dinner more regularly    Patient Instructions  Amaryl 1/2 pill in am Take glimeperide before Bfst  Check blood sugars on waking up days a week  Also check blood sugars about 2 hours after meals and do this after different meals by rotation  Recommended blood sugar levels on waking up are 90-130 and about 2 hours after meal is 130-160  Please bring your blood sugar monitor to each visit, thank you    Elayne Snare 04/20/2022, 11:14 AM

## 2022-04-20 NOTE — Patient Instructions (Addendum)
Amaryl 1/2 pill in am Take glimeperide before Bfst  Check blood sugars on waking up days a week  Also check blood sugars about 2 hours after meals and do this after different meals by rotation  Recommended blood sugar levels on waking up are 90-130 and about 2 hours after meal is 130-160  Please bring your blood sugar monitor to each visit, thank you

## 2022-05-07 ENCOUNTER — Encounter: Payer: Self-pay | Admitting: Endocrinology

## 2022-05-07 DIAGNOSIS — E1165 Type 2 diabetes mellitus with hyperglycemia: Secondary | ICD-10-CM

## 2022-05-08 MED ORDER — ALOGLIPTIN-PIOGLITAZONE 25-30 MG PO TABS: 1.0000 | ORAL_TABLET | Freq: Every day | ORAL | 3 refills | Status: DC

## 2022-06-06 ENCOUNTER — Other Ambulatory Visit: Payer: Self-pay | Admitting: Endocrinology

## 2022-08-09 IMAGING — CT CT CERVICAL SPINE W/O CM
3 of 4 series · 12 of 33 positions shown, 14 images · non-contrast
Comparison: No pertinent prior exams available for comparison.

CLINICAL DATA: Fall. Additional provided: Fall yesterday, hitting
forehead on rock.

EXAM:
CT HEAD WITHOUT CONTRAST
CT CERVICAL SPINE WITHOUT CONTRAST
TECHNIQUE: Multidetector CT imaging of the head and cervical spine was
performed following the standard protocol without intravenous
contrast. Multiplanar CT image reconstructions of the cervical spine
were also generated.

[Series 5: cor bone · coronal · 0.29mm/px · 3 of 63 slices shown]
[im 13/63  bone]
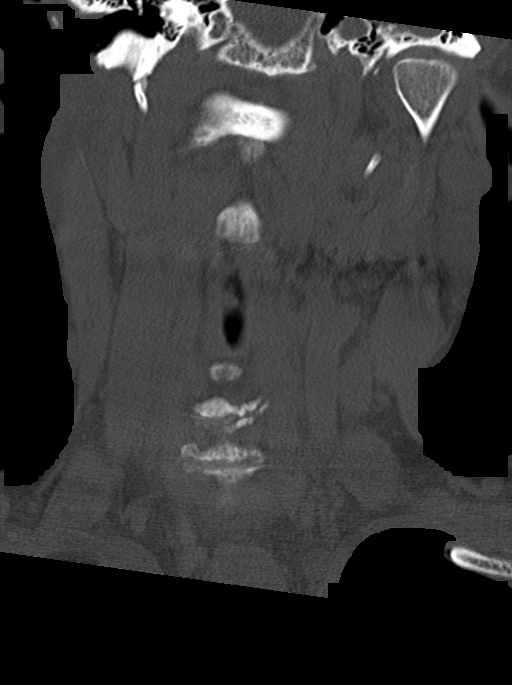
[im 25/63  bone]
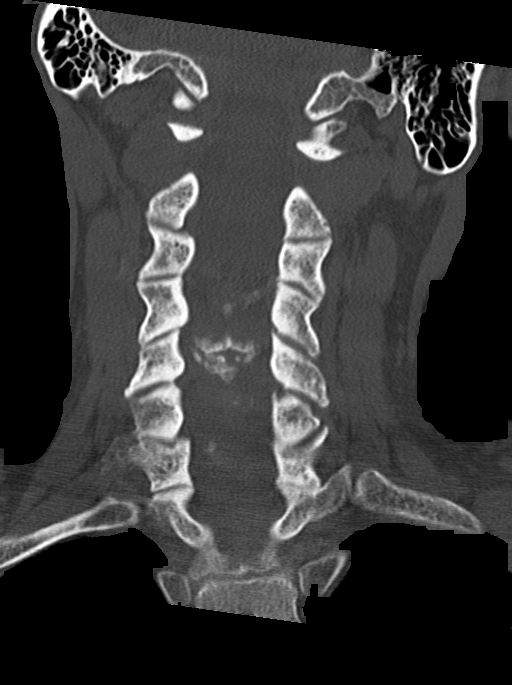
[im 38/63  bone]
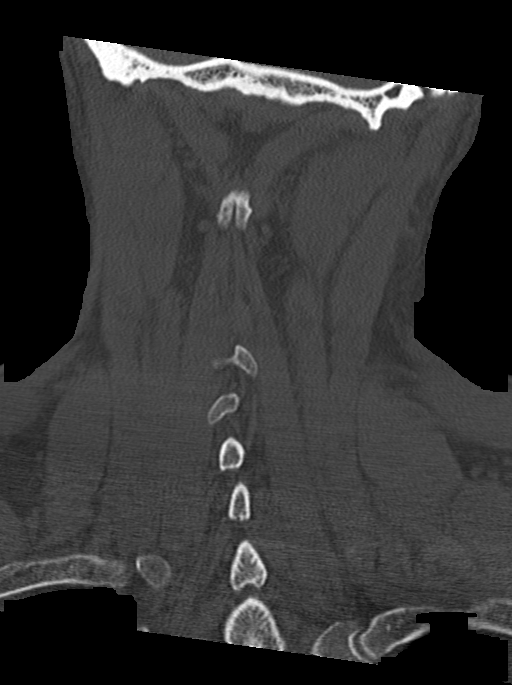

[Series 6: sag bone · sagittal · 0.25mm/px · 5 of 61 slices shown, 6 images]
[im 21/61  bone]
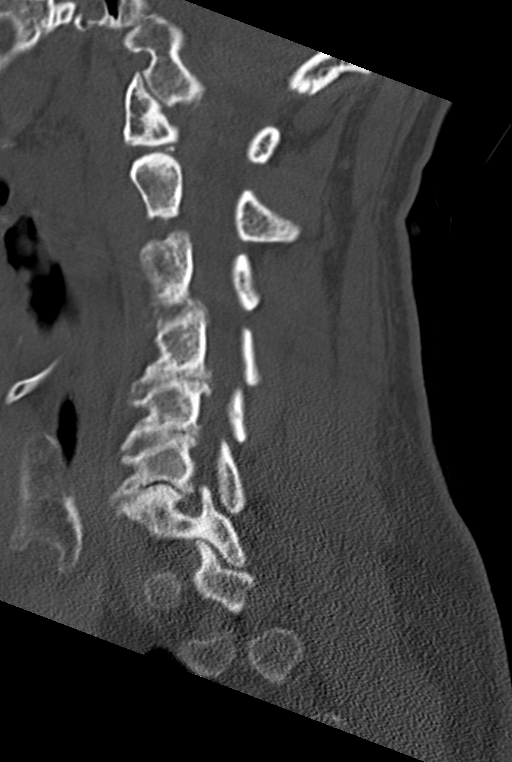
[im 26/61  bone]
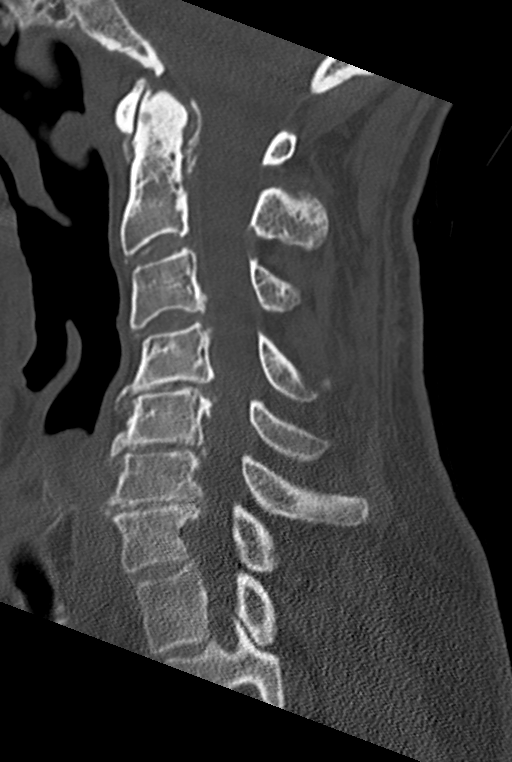
[im 31/61  soft-tissue]
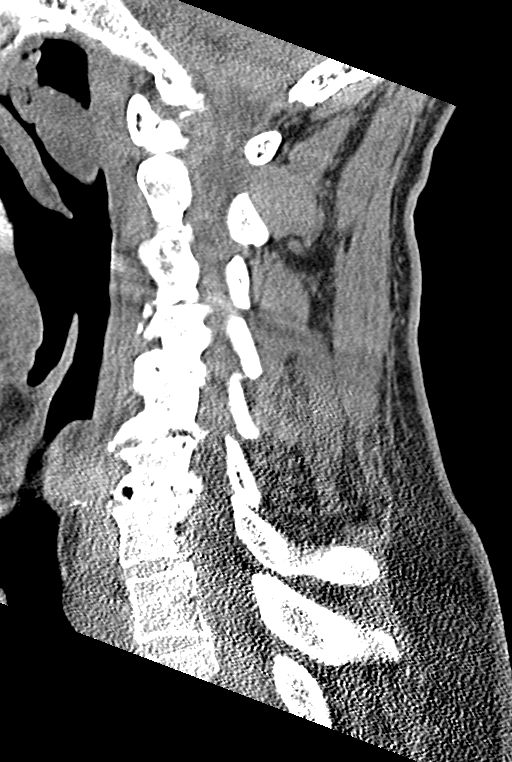
[im 31/61  bone]
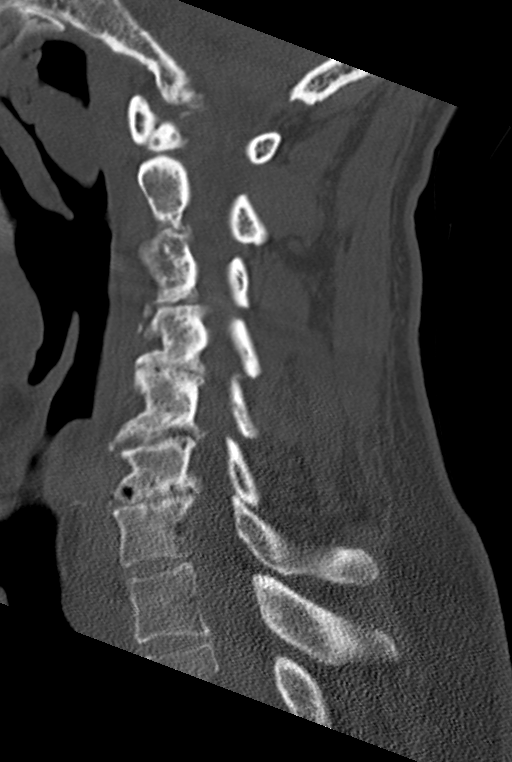
[im 36/61  bone]
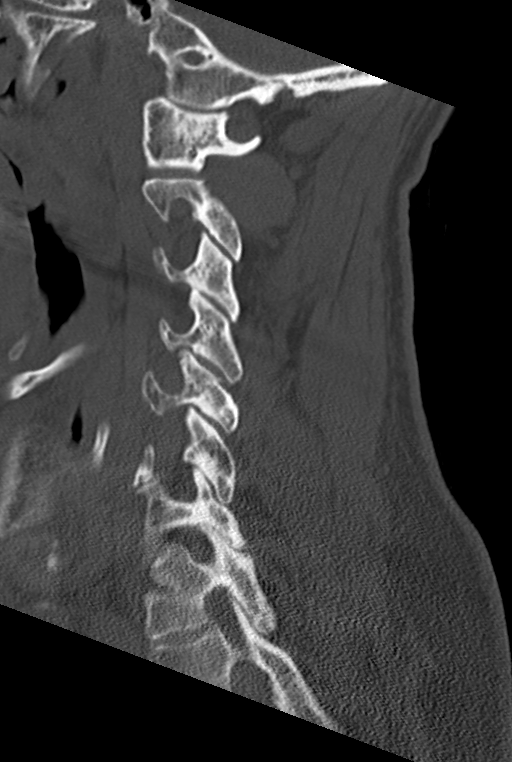
[im 41/61  bone]
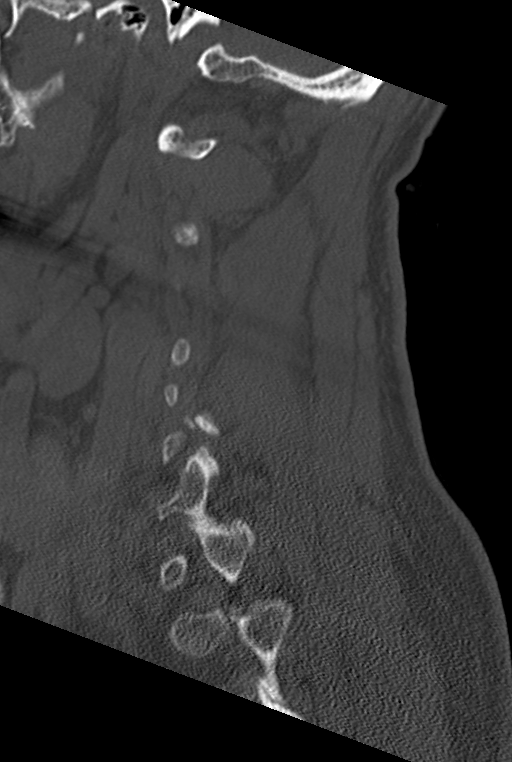

[Series 7: orthogonal axials · axial · 0.21mm/px · z∈[-276,-176]mm · 4 of 80 slices shown, 5 images]
[im 14/80  soft-tissue]
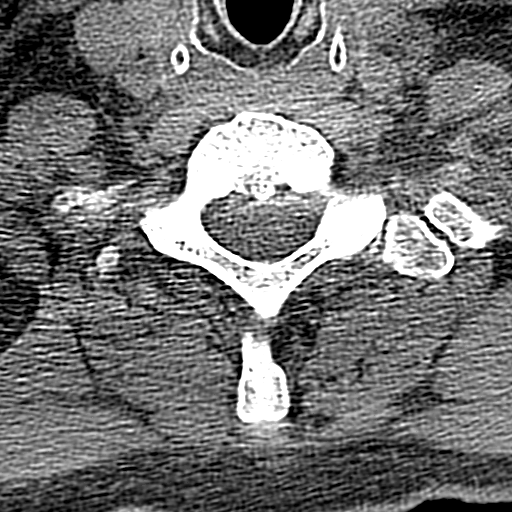
[im 14/80  bone]
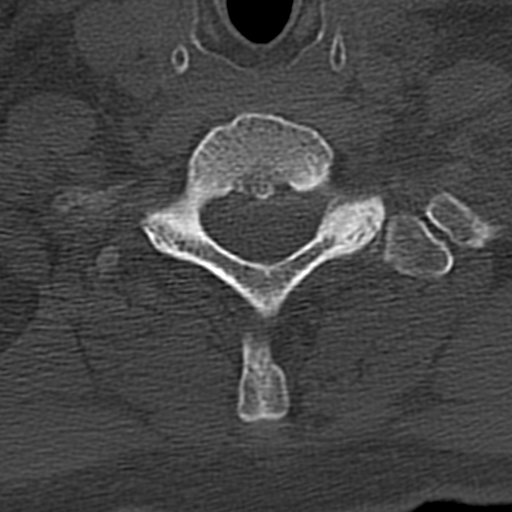
[im 27/80  bone]
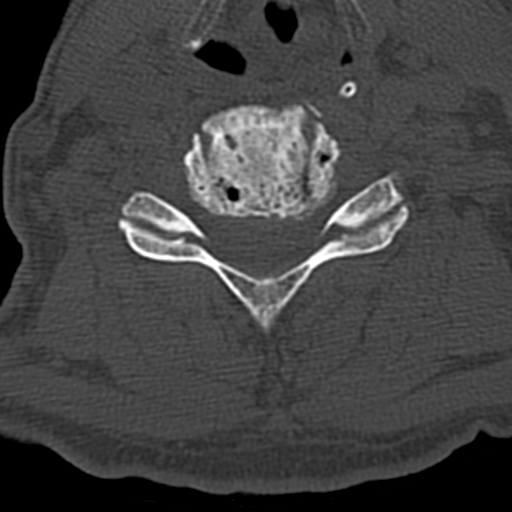
[im 53/80  bone]
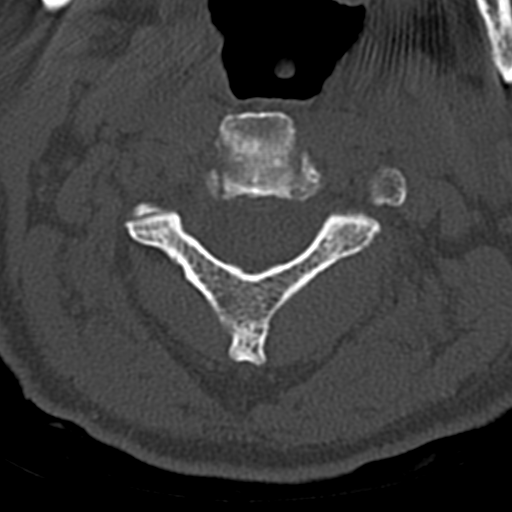
[im 66/80  bone]
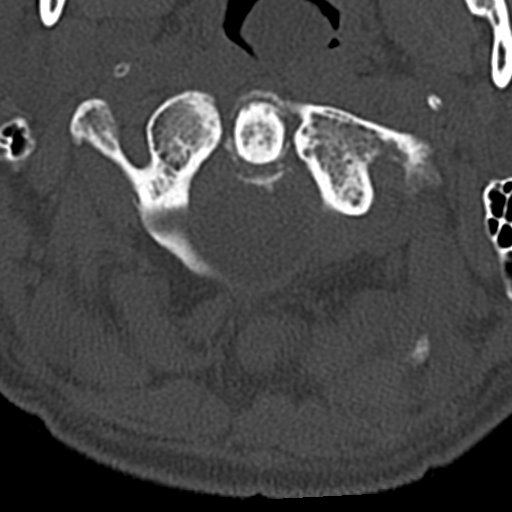

[12 of 33 positions shown; findings below may reference images not displayed]

FINDINGS: CT HEAD FINDINGS

Brain:

Cerebral volume is normal for age.

Moderate patchy and ill-defined hypoattenuation within the cerebral
white matter, nonspecific but compatible with chronic small vessel
ischemic disease.

There is no acute intracranial hemorrhage.

No demarcated cortical infarct.

No extra-axial fluid collection.

No evidence of intracranial mass.

No midline shift.

Vascular: No hyperdense vessel.  Atherosclerotic calcifications.

Skull: Normal. Negative for fracture or focal lesion.

Sinuses/Orbits: Visualized orbits show no acute finding. Extensive
partial opacification of the partially imaged left maxillary sinus.

Other: Right forehead/periorbital soft tissue swelling, partially
imaged. Incompletely imaged polypoid soft tissue within the
posterior nasopharynx measuring at least 3.5 cm.

CT CERVICAL SPINE FINDINGS

Alignment: Cervical dextrocurvature. Reversal of the expected
cervical lordosis. No significant spondylolisthesis.

Skull base and vertebrae: No acute fracture. No primary bone lesion
or focal pathologic process.

Soft tissues and spinal canal: No prevertebral fluid or swelling. No
visible canal hematoma.

Disc levels: Cervical spondylosis with multilevel disc space
narrowing, disc bulges, endplate spurring, posterior disc
osteophytes, uncovertebral hypertrophy and facet arthrosis. Disc
space narrowing is advanced at C4-C5, C5-C6 and C6-C7. Multilevel
spinal canal stenosis. Most notably, there is apparent
moderate/severe spinal canal stenosis at C4-C5, C5-C6 and C6-C7.
Degenerative changes are also present at the C1-C2 articulation.

Upper chest: No consolidation within the imaged lung apices. No
visible pneumothorax.
IMPRESSION: CT head:

1. No evidence of acute intracranial abnormality.
2. Right forehead and periorbital soft tissue swelling, partially
imaged.
3. Moderate cerebral white matter chronic small vessel ischemic
disease.
4. Extensive partial opacification of the partially imaged left
maxillary sinus.
5. Incompletely imaged polypoid soft tissue within the posterior
nasopharynx measuring at least 3.5 cm. Nonemergent maxillofacial CT
and ENT consultation are recommended.

CT cervical spine:

1. No evidence of acute fracture to the cervical spine.
2. Nonspecific reversal of the expected cervical lordosis.
3. Cervical dextrocurvature.
4. Cervical spondylosis. Notably, there is apparent moderate/severe
spinal canal stenosis at C4-C5, C5-C6 and C6-C7. Multilevel bony
neural foraminal narrowing.

## 2022-08-18 ENCOUNTER — Other Ambulatory Visit: Payer: Self-pay | Admitting: Endocrinology

## 2022-08-21 ENCOUNTER — Other Ambulatory Visit (INDEPENDENT_AMBULATORY_CARE_PROVIDER_SITE_OTHER): Payer: Federal, State, Local not specified - PPO

## 2022-08-21 ENCOUNTER — Other Ambulatory Visit: Payer: Self-pay | Admitting: Endocrinology

## 2022-08-21 DIAGNOSIS — E1165 Type 2 diabetes mellitus with hyperglycemia: Secondary | ICD-10-CM | POA: Diagnosis not present

## 2022-08-21 LAB — BASIC METABOLIC PANEL
BUN: 21 mg/dL (ref 6–23)
CO2: 27 mEq/L (ref 19–32)
Calcium: 9.1 mg/dL (ref 8.4–10.5)
Chloride: 105 mEq/L (ref 96–112)
Creatinine, Ser: 1.07 mg/dL (ref 0.40–1.50)
GFR: 68.05 mL/min (ref >60.00)
Glucose, Bld: 248 mg/dL — ABNORMAL HIGH (ref 70–99)
Potassium: 3.9 mEq/L (ref 3.5–5.1)
Sodium: 139 mEq/L (ref 135–145)

## 2022-08-21 LAB — HEMOGLOBIN A1C: Hgb A1c MFr Bld: 6.3 % (ref 4.6–6.5)

## 2022-08-24 ENCOUNTER — Ambulatory Visit: Payer: Federal, State, Local not specified - PPO | Admitting: Endocrinology

## 2022-08-24 ENCOUNTER — Encounter: Payer: Self-pay | Admitting: Endocrinology

## 2022-08-24 VITALS — BP 124/70 | HR 70 | Ht 68.5 in | Wt 165.6 lb

## 2022-08-24 DIAGNOSIS — Z7984 Long term (current) use of oral hypoglycemic drugs: Secondary | ICD-10-CM

## 2022-08-24 DIAGNOSIS — E1165 Type 2 diabetes mellitus with hyperglycemia: Secondary | ICD-10-CM | POA: Diagnosis not present

## 2022-08-24 NOTE — Progress Notes (Unsigned)
Patient ID: Kenneth Lawson, male   DOB: [birth year], 77 y.o.   MRN: [id number]   Reason for Appointment: Diabetes follow-up    History of Present Illness    Diagnosis: date of diagnosis: 1997.   Previous history: He was previously treated with glyburide, Actos and metformin.  Metformin was stopped because he had GI side effects  His blood sugars had been relatively high in the last 3-4 years and A1c had been consistently over 7.5% until his initial consultation He was started on Invokana in 10/13 which had helped modestly. His blood sugars had shown considerable improvement with  adding  Nesina since 2/14 .    He was also on Actos which he had stopped but blood sugars did not appear to worsen. .  The previous HbgA1c levels: 6.4 in 4/14,  7.4 in 12/13, prior range from 7.8-8.3 since 2011   RECENT history:   Oral hypoglycemic drugs: Oseni 25/30,   Amaryl 4 mg after breakfast, Rybelsus 7 mg daily before breakfast  A1c is 6.3, stable   Current blood sugar patterns, management and problems identified:  He did not bring his monitor for download and likely is checking blood sugars very infrequently As before his blood sugars tend to be the highest after breakfast especially if he is eating more carbohydrates such as 2 slices of toast, he does add some protein usually He is taking his Rybelsus 30-minute before breakfast at least as directed without nausea Also only rare hypoglycemic symptoms during the day if he is late for lunch However he takes his Amaryl with his food in the morning He has been regular with his exercise regimen with either walking or going to the gym, may walk more than 2 miles at a time  Dinner usually around 8 pm   Side effects from medications: bloating, gas and GI distress from metformin, even 500 mg, balanitis from Invokana and Jardiance  Glucometer: ACCU-CHEK.   Recent range 85-200 by recall   Dietician visit: Most recent: 1997 at diagnosis.  .    Wt Readings from Last 3 Encounters:  08/24/22 165 lb 9.6 oz (75.1 kg)  04/20/22 169 lb 9.6 oz (76.9 kg)  02/10/22 165 lb (74.8 kg)    Lab Results  Component Value Date   HGBA1C 6.3 08/21/2022   HGBA1C 6.2 (A) 04/20/2022   HGBA1C 6.1 10/26/2021   Lab Results  Component Value Date   MICROALBUR 2.6 (H) 10/26/2021   LDLCALC 93 10/26/2021   CREATININE 1.07 08/21/2022    OTHER active problems: See review of systems   Lab on 08/21/2022  Component Date Value Ref Range Status   Sodium 08/21/2022 139  135 - 145 mEq/L Final   Potassium 08/21/2022 3.9  3.5 - 5.1 mEq/L Final   Chloride 08/21/2022 105  96 - 112 mEq/L Final   CO2 08/21/2022 27  19 - 32 mEq/L Final   Glucose, Bld 08/21/2022 248 (H)  70 - 99 mg/dL Final   BUN 08/65/7846 21  6 - 23 mg/dL Final   Creatinine, Ser 08/21/2022 1.07  0.40 - 1.50 mg/dL Final   GFR 96/29/5284 68.05  >60.00 mL/min Final   Calculated using the CKD-EPI Creatinine Equation (2021)   Calcium 08/21/2022 9.1  8.4 - 10.5 mg/dL Final   Hgb X3K MFr Bld 08/21/2022 6.3  4.6 - 6.5 % Final   Glycemic Control Guidelines for People with Diabetes:Non Diabetic:  <6%Goal of Therapy: <7%Additional Action Suggested:  >8%     Allergies as  of 08/24/2022       Reactions   Metformin Diarrhea, Other (See Comments)        Medication List        Accurate as of August 24, 2022 11:59 PM. If you have any questions, ask your nurse or doctor.          Accu-Chek Guide test strip Generic drug: glucose blood USE TO CHECK BLOOD SUGARS THREE TIMES DAILY.   Accu-Chek Softclix Lancets lancets 3 times daily As directed DXE11.65   alfuzosin 10 MG 24 hr tablet Commonly known as: UROXATRAL Take 10 mg by mouth daily.   Alogliptin-Pioglitazone 25-30 MG Tabs Take 1 tablet by mouth daily.   ALPRAZolam 0.5 MG tablet Commonly known as: XANAX Take 0.5 mg by mouth every other day as needed. Take 1/2 tablet by mouth every other day as needed.   atorvastatin 80 MG  tablet Commonly known as: LIPITOR TAKE 1 TABLET DAILY   buPROPion 300 MG 24 hr tablet Commonly known as: WELLBUTRIN XL Indications: anxiousness associated with depression. Take 1 tablet by mouth every day   busPIRone 10 MG tablet Commonly known as: BUSPAR Take 5-10 mg by mouth 2 (two) times daily as needed.   finasteride 5 MG tablet Commonly known as: PROSCAR   gabapentin 300 MG capsule Commonly known as: NEURONTIN Take by mouth.   glimepiride 4 MG tablet Commonly known as: AMARYL TAKE 1 TABLET AT BREAKFAST AND TAKE 1/2  AT LUNCH IF EATING A MEAL, OTHERWISE SKIP   Hydrocortisone Ace-Pramoxine 2.5-1 % Crea Apply topically 3 (three) times daily. As needed   mometasone 50 MCG/ACT nasal spray Commonly known as: NASONEX USE 2 SPRAYS IN EACH NOSTRIL NASALLY ONCE A DAY AS NEEDED   Rybelsus 7 MG Tabs Generic drug: Semaglutide TAKE 1 TABLET DAILY BEFORE BREAKFAST WITH HALF GLASS OF WATER. START AFTER 3MG  RUNS OUT. (DO NOT FILL UNTIL 3MG  RUNS OUT)   tadalafil 5 MG tablet Commonly known as: CIALIS Take 5 mg by mouth daily as needed for erectile dysfunction.   traMADol 50 MG tablet Commonly known as: ULTRAM Take 1 tablet 3 times a day by oral route as needed for 30 days.   Viberzi 75 MG Tabs Generic drug: Eluxadoline Take by mouth 2 (two) times daily.   Vilazodone HCl 20 MG Tabs Indications: major depressive disorder. Take 1 tablet by mouth every day        Allergies:  Allergies  Allergen Reactions   Metformin Diarrhea and Other (See Comments)    Past Medical History:  Diagnosis Date   Arthritis    Diabetes mellitus without complication (HCC)    Hypercholesterolemia    Hypertension     Past Surgical History:  Procedure Laterality Date   LAPARASCOPIC MEDICAN ARCUATE LIGAMENT RELEASE     MINOR HEMORRHOIDECTOMY N/A     Family History  Problem Relation Age of Onset   Cancer Neg Hx     Social History:  reports that he has quit smoking. His smoking use  included cigarettes. He has never used smokeless tobacco. He reports current alcohol use. He reports that he does not use drugs.  Review of Systems:    HYPERLIPIDEMIA: Followed by PCP No history of CAD Has been taking 80 mg Lipitor and LDL as follows PCP checked his LDL in January: Result was 90  Lab Results  Component Value Date   CHOL 164 10/26/2021   CHOL 164 10/17/2020   CHOL 170 07/19/2020   Lab Results  Component Value  Date   HDL 55.50 10/26/2021   HDL 52.80 10/17/2020   HDL 56.60 07/19/2020   Lab Results  Component Value Date   LDLCALC 93 10/26/2021   LDLCALC 97 10/17/2020   LDLCALC 103 (H) 07/19/2020   Lab Results  Component Value Date   TRIG 74.0 10/26/2021   TRIG 72.0 10/17/2020   TRIG 52.0 07/19/2020   Lab Results  Component Value Date   CHOLHDL 3 10/26/2021   CHOLHDL 3 10/17/2020   CHOLHDL 3 07/19/2020   Lab Results  Component Value Date   LDLDIRECT 134.6 11/18/2013    Last diabetic eye exam on 09/03/22  Diabetic foot exam in 2/24 showed normal monofilament sensation in the toes and plantar surfaces, no skin lesions or ulcers on the feet and normal pedal pulses   BP Readings from Last 3 Encounters:  08/24/22 124/70  04/20/22 (!) 146/72  02/10/22 (!) 149/79   His annual eye exam was in 4/23 but report not available    Examination:   BP 124/70 (BP Location: Left Arm, Patient Position: Sitting)   Pulse 70   Ht 5' 8.5" (1.74 m)   Wt 165 lb 9.6 oz (75.1 kg)   SpO2 97%   BMI 24.81 kg/m   Body mass index is 24.81 kg/m.     Assesment/Plan:   Diabetes type 2 without significant obesity  See history of present illness for detailed discussion of current diabetes management, blood sugar patterns and problems identified  His A1c is 6.3, unchanged  Currently taking Rybelsus 7 mg with generic Oseni and Amaryl 4 mg in the morning  Blood sugars are overall well-controlled except higher after breakfast likely from higher carbohydrate intake He  is regular with either walking or other exercise regimen several days a week  Currently monitoring very infrequently Discussed that he needs to monitor after meals more often Also suggested that he have only 1 slice of toast for breakfast instead of 2 Also make sure he takes his Amaryl before starting to eat in the morning  Follow-up in 4 months   Patient Instructions  Take glimeperide before Bfst in am   Reather Littler 08/27/2022, 10:15 AM

## 2022-08-24 NOTE — Patient Instructions (Signed)
Take glimeperide before Bfst in am

## 2022-12-27 ENCOUNTER — Ambulatory Visit: Payer: Federal, State, Local not specified - PPO | Admitting: Endocrinology

## 2022-12-27 ENCOUNTER — Encounter: Payer: Self-pay | Admitting: Endocrinology

## 2022-12-27 VITALS — BP 162/80 | HR 78 | Resp 20 | Ht 68.5 in | Wt 164.0 lb

## 2022-12-27 DIAGNOSIS — Z7984 Long term (current) use of oral hypoglycemic drugs: Secondary | ICD-10-CM

## 2022-12-27 DIAGNOSIS — E119 Type 2 diabetes mellitus without complications: Secondary | ICD-10-CM | POA: Diagnosis not present

## 2022-12-27 LAB — LIPID PANEL
Cholesterol: 160 mg/dL (ref 0–200)
HDL: 56.4 mg/dL (ref >39.00)
LDL Cholesterol: 92 mg/dL (ref 0–99)
NonHDL: 103.18
Total CHOL/HDL Ratio: 3
Triglycerides: 54 mg/dL (ref 0.0–149.0)
VLDL: 10.8 mg/dL (ref 0.0–40.0)

## 2022-12-27 LAB — POCT GLYCOSYLATED HEMOGLOBIN (HGB A1C): Hemoglobin A1C: 5.8 % — AB (ref 4.0–5.6)

## 2022-12-27 LAB — BASIC METABOLIC PANEL
BUN: 19 mg/dL (ref 6–23)
CO2: 27 meq/L (ref 19–32)
Calcium: 8.7 mg/dL (ref 8.4–10.5)
Chloride: 108 meq/L (ref 96–112)
Creatinine, Ser: 0.94 mg/dL (ref 0.40–1.50)
GFR: 79.29 mL/min (ref >60.00)
Glucose, Bld: 139 mg/dL — ABNORMAL HIGH (ref 70–99)
Potassium: 3.9 meq/L (ref 3.5–5.1)
Sodium: 141 meq/L (ref 135–145)

## 2022-12-27 LAB — MICROALBUMIN / CREATININE URINE RATIO
Creatinine,U: 176.4 mg/dL
Microalb Creat Ratio: 0.6 mg/g (ref 0.0–30.0)
Microalb, Ur: 1.1 mg/dL (ref 0.0–1.9)

## 2022-12-27 NOTE — Progress Notes (Signed)
Outpatient Endocrinology Note Kenneth Jaylenn Baiza, MD  12/27/22  Patient's Name: Kenneth Lawson    DOB: 01-30-1946    MRN: [id number]                                                    REASON OF VISIT: Follow up of type 2 diabetes mellitus  PCP: Irven Coe, MD  HISTORY OF PRESENT ILLNESS:   Kenneth Lawson is a 77 y.o. old male with past medical history listed below, is here for follow up for type 2 diabetes mellitus.   Pertinent Diabetes History: Patient was diagnosed with type 2 diabetes mellitus in 1997.  He has controlled type 2 diabetes mellitus.  Chronic Diabetes Complications : Retinopathy: no. Last ophthalmology exam was done on annually, July 2024, following with ophthalmology regularly.  Nephropathy: no Peripheral neuropathy: no Coronary artery disease: no Stroke: no  Relevant comorbidities and cardiovascular risk factors: Obesity: no Body mass index is 24.57 kg/m.  Hypertension: Yes  Hyperlipidemia : Yes, on statin   Current / Home Diabetic regimen includes: Amaryl / Glimepiride 4 mg daily. Rybelsus 7 mg daily.  Alogliptin / pioglitazone ( Oseni) 25-30mg  daily with dinner.   Prior diabetic medications: Metformin stopped due to GI side effects.  He had been on Invokana /Jardiance in the past, stopped due to balanitis.  Glycemic data:   Using Accu-Chek guide glucometer.  He has not been checking most in last 2 weeks he had checked once with glucose of 95, from October 17 to December 27, 2022.  Hypoglycemia: Patient has denies hypoglycemic episodes. Patient has hypoglycemia awareness.  Factors modifying glucose control: 1.  Diabetic diet assessment: 3 meals.   2.  Staying active or exercising: walk, plays grand kids basketball, football.   3.  Medication compliance: compliant all of the time.  Interval history  Hemoglobin A1c today 5.8%.  He denies hypoglycemia and hypoglycemic symptoms.  He has not been checking blood sugar much at home.  Diabetes regimen as  reviewed above.  No GI issues regarding taking Rybelsus.  No other complaints today.  REVIEW OF SYSTEMS As per history of present illness.   PAST MEDICAL HISTORY: Past Medical History:  Diagnosis Date   Arthritis    Diabetes mellitus without complication (HCC)    Hypercholesterolemia    Hypertension     PAST SURGICAL HISTORY: Past Surgical History:  Procedure Laterality Date   LAPARASCOPIC MEDICAN ARCUATE LIGAMENT RELEASE     MINOR HEMORRHOIDECTOMY N/A     ALLERGIES: Allergies  Allergen Reactions   Metformin Diarrhea and Other (See Comments)    FAMILY HISTORY:  Family History  Problem Relation Age of Onset   Cancer Neg Hx     SOCIAL HISTORY: Social History   Socioeconomic History   Marital status: Married    Spouse name: Not on file   Number of children: Not on file   Years of education: Not on file   Highest education level: Not on file  Occupational History   Not on file  Tobacco Use   Smoking status: Former    Types: Cigarettes   Smokeless tobacco: Never  Substance and Sexual Activity   Alcohol use: Yes    Comment: rarely   Drug use: Never   Sexual activity: Not on file  Other Topics Concern   Not on file  Social History  Narrative   Not on file   Social Determinants of Health   Financial Resource Strain: Not on file  Food Insecurity: Not on file  Transportation Needs: Not on file  Physical Activity: Unknown (08/15/2021)   [facility]   Exercise Vital Sign    Days of Exercise per Week: 7 days    Minutes of Exercise per Session: Not on file  Stress: Not on file  Social Connections: Not on file    MEDICATIONS:  Current Outpatient Medications  Medication Sig Dispense Refill   ACCU-CHEK GUIDE test strip USE TO CHECK BLOOD SUGARS THREE TIMES DAILY. 100 strip 2   Accu-Chek Softclix Lancets lancets 3 times daily As directed DXE11.65 200 each 3   alfuzosin (UROXATRAL) 10 MG 24 hr tablet Take  10 mg by mouth daily.     Alogliptin-Pioglitazone 25-30 MG TABS Take 1 tablet by mouth daily. 90 tablet 3   ALPRAZolam (XANAX) 0.5 MG tablet Take 0.5 mg by mouth every other day as needed. Take 1/2 tablet by mouth every other day as needed.     atorvastatin (LIPITOR) 80 MG tablet TAKE 1 TABLET DAILY 90 tablet 0   buPROPion (WELLBUTRIN XL) 300 MG 24 hr tablet Indications: anxiousness associated with depression. Take 1 tablet by mouth every day     busPIRone (BUSPAR) 10 MG tablet Take 5-10 mg by mouth 2 (two) times daily as needed.     Eluxadoline (VIBERZI) 75 MG TABS Take by mouth 2 (two) times daily.     finasteride (PROSCAR) 5 MG tablet      gabapentin (NEURONTIN) 300 MG capsule Take by mouth.     glimepiride (AMARYL) 4 MG tablet TAKE 1 TABLET AT BREAKFAST AND TAKE 1/2  AT LUNCH IF EATING A MEAL, OTHERWISE SKIP 180 tablet 0   mometasone (NASONEX) 50 MCG/ACT nasal spray USE 2 SPRAYS IN EACH NOSTRIL NASALLY ONCE A DAY AS NEEDED  5   Pramoxine-HC (HYDROCORTISONE ACE-PRAMOXINE) 2.5-1 % CREA Apply topically 3 (three) times daily. As needed     RYBELSUS 7 MG TABS TAKE 1 TABLET DAILY BEFORE BREAKFAST WITH HALF GLASS OF WATER. START AFTER 3MG  RUNS OUT. (DO NOT FILL UNTIL 3MG  RUNS OUT) 90 tablet 1   tadalafil (CIALIS) 5 MG tablet Take 5 mg by mouth daily as needed for erectile dysfunction.     traMADol (ULTRAM) 50 MG tablet Take 1 tablet 3 times a day by oral route as needed for 30 days.     Vilazodone HCl 20 MG TABS Indications: major depressive disorder. Take 1 tablet by mouth every day     No current facility-administered medications for this visit.    PHYSICAL EXAM: Vitals:   12/27/22 0934 12/27/22 0935  BP: (!) 160/90 (!) 162/80  Pulse: 78   Resp: 20   SpO2: 97%   Weight: 164 lb (74.4 kg)   Height: 5' 8.5" (1.74 m)    Body mass index is 24.57 kg/m.  Wt Readings from Last 3 Encounters:  12/27/22 164 lb (74.4 kg)  08/24/22 165 lb 9.6 oz (75.1 kg)  04/20/22 169 lb 9.6 oz (76.9 kg)     General: Well developed, well nourished male in no apparent distress.  HEENT: AT/[place], no external lesions.  Eyes: Conjunctiva clear and no icterus. Neck: Neck supple  Lungs: Respirations not labored Neurologic: Alert, oriented, normal speech Extremities / Skin: Dry.  Psychiatric: Does not appear depressed or anxious  Diabetic Foot Exam - Simple  No data filed    LABS Reviewed Lab Results  Component Value Date   HGBA1C 5.8 (A) 12/27/2022   HGBA1C 6.3 08/21/2022   HGBA1C 6.2 (A) 04/20/2022   Lab Results  Component Value Date   FRUCTOSAMINE 263 03/24/2021   FRUCTOSAMINE 276 04/18/2020   FRUCTOSAMINE 244 06/01/2019   Lab Results  Component Value Date   CHOL 164 10/26/2021   HDL 55.50 10/26/2021   LDLCALC 93 10/26/2021   LDLDIRECT 134.6 11/18/2013   TRIG 74.0 10/26/2021   CHOLHDL 3 10/26/2021   Lab Results  Component Value Date   MICRALBCREAT 0.9 10/26/2021   MICRALBCREAT 1.4 10/17/2020   Lab Results  Component Value Date   CREATININE 1.07 08/21/2022   Lab Results  Component Value Date   GFR 68.05 08/21/2022    ASSESSMENT / PLAN  1. Controlled type 2 diabetes mellitus without complication, without long-term current use of insulin (HCC)     Diabetes Mellitus type 2, complicated by no known complications. - Diabetic status / severity: Controlled.  Lab Results  Component Value Date   HGBA1C 5.8 (A) 12/27/2022    - Hemoglobin A1c goal : <7%  Hemoglobin A1c is 5.8% today.  He has not been checking blood sugar much.  Advised to check more often at least in the morning fasting daily and at bedtime few times a week.  Denies hypoglycemic symptoms.  - Medications: No change. Amaryl / Glimepiride 4 mg daily. Rybelsus 7 mg daily.  Alogliptin / pioglitazone ( Oseni) 25-30mg  daily with dinner.   - Home glucose testing: Check in the morning fasting and at bedtime few times a week. - Discussed/ Gave Hypoglycemia treatment plan.  # Consult : not required at  this time.   # Annual urine for microalbuminuria/ creatinine ratio, no microalbuminuria currently.  Will check today.  Will check BMP today. Last  Lab Results  Component Value Date   MICRALBCREAT 0.9 10/26/2021    # Foot check nightly.  # Annual dilated diabetic eye exams.   - Diet: Eat reasonable portion sizes to promote a healthy weight - Life style / activity / exercise: Discussed.  2. Blood pressure  -  BP Readings from Last 1 Encounters:  12/27/22 (!) 162/80    - Control is not in target. Mildly high blood pressure. - No change in current plans.  Managed by primary care provider.  3. Lipid status / Hyperlipidemia - Last  Lab Results  Component Value Date   LDLCALC 93 10/26/2021   - Continue atorvastatin 80 mg daily.  Will check lipid panel today.  Diagnoses and all orders for this visit:  Controlled type 2 diabetes mellitus without complication, without long-term current use of insulin (HCC) -     POCT glycosylated hemoglobin (Hb A1C) -     Lipid panel; Future -     Microalbumin / creatinine urine ratio; Future -     Basic metabolic panel; Future    DISPOSITION Follow up in clinic in  4 months suggested.   All questions answered and patient verbalized understanding of the plan.  Kenneth Oluwadarasimi Favor, MD [facility] End[facility] [address] [place], [address] [place], [place] [address] Phone # [phone]  At least part of this note was generated using voice recognition software. Inadvertent word errors may have occurred, which were not recognized during the proofreading process.

## 2022-12-27 NOTE — Patient Instructions (Signed)
Same diabetes medications. No change.

## 2023-01-16 ENCOUNTER — Encounter: Payer: Self-pay | Admitting: Endocrinology

## 2023-01-17 ENCOUNTER — Other Ambulatory Visit: Payer: Self-pay

## 2023-01-17 DIAGNOSIS — E119 Type 2 diabetes mellitus without complications: Secondary | ICD-10-CM

## 2023-01-17 MED ORDER — RYBELSUS 7 MG PO TABS: ORAL_TABLET | ORAL | 1 refills | Status: DC

## 2023-02-28 ENCOUNTER — Other Ambulatory Visit: Payer: Self-pay | Admitting: Physician Assistant

## 2023-03-07 ENCOUNTER — Other Ambulatory Visit: Payer: Self-pay | Admitting: Physician Assistant

## 2023-03-12 ENCOUNTER — Ambulatory Visit: Payer: Medicare Other | Admitting: Diagnostic Neuroimaging

## 2023-03-12 ENCOUNTER — Encounter: Payer: Self-pay | Admitting: Diagnostic Neuroimaging

## 2023-03-12 VITALS — BP 138/62 | HR 69 | Ht 68.0 in | Wt 163.6 lb

## 2023-03-12 DIAGNOSIS — R269 Unspecified abnormalities of gait and mobility: Secondary | ICD-10-CM | POA: Diagnosis not present

## 2023-03-12 DIAGNOSIS — G959 Disease of spinal cord, unspecified: Secondary | ICD-10-CM

## 2023-03-12 NOTE — Progress Notes (Signed)
 [facility]  PATIENT: Kenneth Lawson DOB: [birth year]  REFERRING CLINICIAN: [name], MD HISTORY FROM: patient  REASON FOR VISIT: new consult    HISTORICAL  CHIEF COMPLAINT:  Chief Complaint  Patient presents with   New Patient (Initial Visit)    Pt in 7 with wife Pt here for tremors and balance  Pt states tremors in both hands right hand worse Wife states pt has fallen 6 times in last six months Wife states increased leg cramps at night Wife states increased fatigue in last month. Falling asleep during the day     HISTORY OF PRESENT ILLNESS:   78 year old male here for evaluation of tremor, gait and balance difficulty, low back pain.  For past 1 to 2 years patient has had mild and gradual onset of postural tremor in the upper extremities with progressively worsening handwriting.  He has noticed this problem worsening in the last 6 months although wife has noticed this for little bit longer.  No definite resting tremor.  No family history of tremor.  In the last 2 to 5 years patient has had gradual onset of mild gait and balance difficulty, low back pain, spinal stenosis issues.  This has been managed conservatively by orthopedic spine clinic and pain management.  He was offered to have lumbar decompression surgery but patient has been holding off trying to manage more conservatively.  In the last 6 months his gait and balance have gradually worsened.  He has had several falls over the last few years.  Patient also has had some chronic neck pain issues with some arthritis issues as well.  No memory or cognitive complaints.   REVIEW OF SYSTEMS: Full 14 system review of systems performed and negative with exception of: as per HPI.  ALLERGIES: Allergies  Allergen Reactions   Metformin Diarrhea and Other (See Comments)    HOME MEDICATIONS: Outpatient Medications Prior to Visit  Medication Sig Dispense Refill   ACCU-CHEK GUIDE test strip USE TO CHECK  BLOOD SUGARS THREE TIMES DAILY. 100 strip 2   Accu-Chek Softclix Lancets lancets 3 times daily As directed DXE11.65 200 each 3   alfuzosin  (UROXATRAL ) 10 MG 24 hr tablet Take 10 mg by mouth daily.     Alogliptin -Pioglitazone  25-30 MG TABS Take 1 tablet by mouth daily. 90 tablet 3   ALPRAZolam  (XANAX ) 0.5 MG tablet Take 0.5 mg by mouth every other day as needed. Take 1/2 tablet by mouth every other day as needed.     atorvastatin  (LIPITOR) 80 MG tablet TAKE 1 TABLET DAILY 90 tablet 0   buPROPion  (WELLBUTRIN  XL) 300 MG 24 hr tablet Indications: anxiousness associated with depression. Take 1 tablet by mouth every day     busPIRone  (BUSPAR ) 10 MG tablet Take 5-10 mg by mouth 2 (two) times daily as needed.     Eluxadoline  (VIBERZI ) 75 MG TABS Take by mouth 2 (two) times daily.     finasteride  (PROSCAR ) 5 MG tablet      gabapentin (NEURONTIN) 300 MG capsule Take by mouth.     glimepiride  (AMARYL ) 4 MG tablet TAKE 1 TABLET AT BREAKFAST AND TAKE 1/2  AT LUNCH IF EATING A MEAL, OTHERWISE SKIP 180 tablet 0   mometasone (NASONEX) 50 MCG/ACT nasal spray USE 2 SPRAYS IN EACH NOSTRIL NASALLY ONCE A DAY AS NEEDED  5   Semaglutide  (RYBELSUS ) 7 MG TABS Take 1 tablet daily before breakfast with half glass of water . Start after 3mg  runs out. (Do not fill until 3mg   are out) 90 tablet 1   tadalafil (CIALIS) 5 MG tablet Take 5 mg by mouth daily as needed for erectile dysfunction.     traMADol (ULTRAM) 50 MG tablet Take 1 tablet 3 times a day by oral route as needed for 30 days.     Vilazodone  HCl 20 MG TABS Indications: major depressive disorder. Take 1 tablet by mouth every day     Pramoxine-HC (HYDROCORTISONE ACE-PRAMOXINE) 2.5-1 % CREA Apply topically 3 (three) times daily. As needed     No facility-administered medications prior to visit.    PAST MEDICAL HISTORY: Past Medical History:  Diagnosis Date   Arthritis    Diabetes mellitus without complication (HCC)    Hypercholesterolemia    Hypertension      PAST SURGICAL HISTORY: Past Surgical History:  Procedure Laterality Date   LAPARASCOPIC MEDICAN ARCUATE LIGAMENT RELEASE     MINOR HEMORRHOIDECTOMY N/A     FAMILY HISTORY: Family History  Problem Relation Age of Onset   Alzheimer's disease Mother    Cancer Neg Hx    Parkinson's disease Neg Hx    Tremor Neg Hx     SOCIAL HISTORY: Social History   Socioeconomic History   Marital status: Married    Spouse name: Not on file   Number of children: Not on file   Years of education: Not on file   Highest education level: Not on file  Occupational History   Not on file  Tobacco Use   Smoking status: Former    Types: Cigarettes   Smokeless tobacco: Never  Vaping Use   Vaping status: Not on file  Substance and Sexual Activity   Alcohol use: Yes    Comment: rarely   Drug use: Never   Sexual activity: Not on file  Other Topics Concern   Not on file  Social History Narrative   Pt lives with wife    Retired    Social Drivers of Corporate Investment Banker Strain: Not on file  Food Insecurity: Not on file  Transportation Needs: Not on file  Physical Activity: High Risk (01/10/2023)   Received from [facility]   PCARE Exercise SDOH    Exercise: Indicated    PCare Exercise SDOH: Not on file    PCare Exercise SDOH: Not on file  Stress: Not on file  Social Connections: Not on file  Intimate Partner Violence: Not on file     PHYSICAL EXAM  GENERAL EXAM/CONSTITUTIONAL: Vitals:  Vitals:   03/12/23 1543  BP: 138/62  Pulse: 69  Weight: 163 lb 9.6 oz (74.2 kg)  Height: 5' 8 (1.727 m)   Body mass index is 24.88 kg/m. Wt Readings from Last 3 Encounters:  03/12/23 163 lb 9.6 oz (74.2 kg)  12/27/22 164 lb (74.4 kg)  08/24/22 165 lb 9.6 oz (75.1 kg)   Patient is in no distress; well developed, nourished and groomed; neck is supple  CARDIOVASCULAR: Examination of carotid arteries is normal; no carotid bruits Regular rate and rhythm, no  murmurs Examination of peripheral vascular system by observation and palpation is normal  EYES: Ophthalmoscopic exam of optic discs and posterior segments is normal; no papilledema or hemorrhages No results found.  MUSCULOSKELETAL: Gait, strength, tone, movements noted in Neurologic exam below  NEUROLOGIC: MENTAL STATUS:      No data to display         awake, alert, oriented to person, place and time recent and remote memory intact normal attention and concentration language  fluent, comprehension intact, naming intact fund of knowledge appropriate  CRANIAL NERVE:  2nd - no papilledema on fundoscopic exam 2nd, 3rd, 4th, 6th - pupils equal and reactive to light, visual fields full to confrontation, extraocular muscles intact, no nystagmus 5th - facial sensation symmetric 7th - facial strength symmetric 8th - hearing intact 9th - palate elevates symmetrically, uvula midline 11th - shoulder shrug --> LEFT SHOULDER ELEVATED AT BASELINE 12th - tongue protrusion midline  MOTOR:  normal bulk and tone, full strength in the BUE, BLE SLIGHTLY INCREASED TONE IN BUE  SENSORY:  normal and symmetric to light touch, temperature, vibration  COORDINATION:  finger-nose-finger, fine finger movements normal  REFLEXES:  deep tendon reflexes --> BUE 1+, BLE 2+ and symmetric; BORDERLINE HOFFMAN'S ON RIGHT  GAIT/STATION:  narrow based gait; STOOPED POSTURE; DECR LEFT ARM SWING     DIAGNOSTIC DATA (LABS, IMAGING, TESTING) - I reviewed patient records, labs, notes, testing and imaging myself where available.  Lab Results  Component Value Date   WBC 4.1 02/10/2022   HGB 12.5 (L) 02/10/2022   HCT 37.6 (L) 02/10/2022   MCV 90.4 02/10/2022   PLT 137 (L) 02/10/2022      Component Value Date/Time   NA 141 12/27/2022 1013   NA 140 02/13/2019 1558   K 3.9 12/27/2022 1013   CL 108 12/27/2022 1013   CO2 27 12/27/2022 1013   GLUCOSE 139 (H) 12/27/2022 1013   BUN 19 12/27/2022 1013    BUN 19 02/13/2019 1558   CREATININE 0.94 12/27/2022 1013   CALCIUM  8.7 12/27/2022 1013   PROT 6.8 02/10/2022 0731   ALBUMIN 4.2 02/10/2022 0731   AST 12 (L) 02/10/2022 0731   ALT 12 02/10/2022 0731   ALKPHOS 61 02/10/2022 0731   BILITOT 0.5 02/10/2022 0731   GFRNONAA >60 02/10/2022 0731   GFRAA 105 02/13/2019 1558   Lab Results  Component Value Date   CHOL 160 12/27/2022   HDL 56.40 12/27/2022   LDLCALC 92 12/27/2022   LDLDIRECT 134.6 11/18/2013   TRIG 54.0 12/27/2022   CHOLHDL 3 12/27/2022   Lab Results  Component Value Date   HGBA1C 5.8 (A) 12/27/2022   No results found for: VITAMINB12 Lab Results  Component Value Date   TSH 2.97 02/01/2020    07/06/20 CT cervical spine [I reviewed images myself and agree with interpretation. -VRP]  1. No evidence of acute fracture to the cervical spine. 2. Nonspecific reversal of the expected cervical lordosis. 3. Cervical dextrocurvature. 4. Cervical spondylosis. Notably, there is apparent moderate/severe spinal canal stenosis at C4-C5, C5-C6 and C6-C7. Multilevel bony neural foraminal narrowing.  12/23/17 MRI lumbar spine [I reviewed images myself and agree with interpretation. -VRP]  1. At L4-5 there is a broad-based disc bulge. Moderate bilateral facet arthropathy. Bilateral lateral recess stenosis. Mild spinal stenosis. Moderate bilateral foraminal stenosis. 2. At L5-S1 there is a broad central disc protrusion contacting the bilateral intraspinal S1 nerve roots right greater than left. Mild bilateral facet arthropathy.      ASSESSMENT AND PLAN  78 y.o. year old male here with:   Dx:  1. Cervical myelopathy (HCC)   2. Gait difficulty     PLAN:  GAIT DIFFICULTY (due to cervical myelopathy + lumbar spinal stenosis) - check MRI brain and cervical spine - follow up with spine clinic - continue PT / OT evaluations - fall precautions reviewed  TREMOR -Likely essential tremor, longstanding and mild -Monitor  for now but may consider medical treatment with beta-blocker or  primidone in future  Orders Placed This Encounter  Procedures   MR BRAIN WO CONTRAST   MR CERVICAL SPINE WO CONTRAST   Return for pending test results, pending if symptoms worsen or fail to improve.  I spent 60 minutes of face-to-face and non-face-to-face time with patient.  This included previsit chart review, lab review, study review, order entry, electronic health record documentation, patient education.    EDUARD FABIENE HANLON, MD 03/12/2023, 4:41 PM Certified in Neurology, Neurophysiology and Neuroimaging  [facility] [address] [place], [place] [address] [phone]

## 2023-03-12 NOTE — Patient Instructions (Addendum)
  GAIT DIFFICULTY (due to cervical myelopathy + lumbar spinal stenosis) - check MRI brain and cervical spine - follow up with spine clinic - continue PT / OT evaluations - fall precautions reviewed

## 2023-03-13 ENCOUNTER — Telehealth: Payer: Self-pay | Admitting: Diagnostic Neuroimaging

## 2023-03-13 NOTE — Telephone Encounter (Signed)
[employer] federal NPR sent to GI [phone] 

## 2023-03-22 ENCOUNTER — Ambulatory Visit
Admission: RE | Admit: 2023-03-22 | Discharge: 2023-03-22 | Disposition: A | Discharge status: Home / Self Care | Payer: Federal, State, Local not specified - PPO | Source: Ambulatory Visit | Attending: Family Medicine | Admitting: Family Medicine

## 2023-03-22 ENCOUNTER — Other Ambulatory Visit: Payer: Self-pay | Admitting: Family Medicine

## 2023-03-22 DIAGNOSIS — R0781 Pleurodynia: Secondary | ICD-10-CM

## 2023-03-24 ENCOUNTER — Ambulatory Visit
Admission: RE | Admit: 2023-03-24 | Discharge: 2023-03-24 | Disposition: A | Discharge status: Home / Self Care | Payer: Federal, State, Local not specified - PPO | Source: Ambulatory Visit | Attending: Diagnostic Neuroimaging | Admitting: Diagnostic Neuroimaging

## 2023-03-24 DIAGNOSIS — R269 Unspecified abnormalities of gait and mobility: Secondary | ICD-10-CM

## 2023-03-24 DIAGNOSIS — G959 Disease of spinal cord, unspecified: Secondary | ICD-10-CM

## 2023-04-08 ENCOUNTER — Other Ambulatory Visit: Payer: Self-pay | Admitting: *Deleted

## 2023-04-08 ENCOUNTER — Telehealth: Payer: Self-pay

## 2023-04-08 NOTE — Telephone Encounter (Signed)
 Left message for patient to confirm new patient visit on 04/09/23.

## 2023-04-09 ENCOUNTER — Other Ambulatory Visit: Payer: Federal, State, Local not specified - PPO

## 2023-04-09 ENCOUNTER — Inpatient Hospital Stay
Payer: Federal, State, Local not specified - PPO | Attending: Hematology and Oncology | Admitting: Hematology and Oncology

## 2023-04-09 ENCOUNTER — Inpatient Hospital Stay: Payer: Federal, State, Local not specified - PPO

## 2023-04-09 VITALS — BP 156/69 | HR 88 | Temp 98.2°F | Resp 16 | Ht 68.9 in | Wt 165.9 lb

## 2023-04-09 DIAGNOSIS — R946 Abnormal results of thyroid function studies: Secondary | ICD-10-CM | POA: Diagnosis not present

## 2023-04-09 DIAGNOSIS — M5080 Other cervical disc disorders, unspecified cervical region: Secondary | ICD-10-CM | POA: Diagnosis not present

## 2023-04-09 DIAGNOSIS — E119 Type 2 diabetes mellitus without complications: Secondary | ICD-10-CM

## 2023-04-09 DIAGNOSIS — M5090 Cervical disc disorder, unspecified, unspecified cervical region: Secondary | ICD-10-CM | POA: Diagnosis not present

## 2023-04-09 DIAGNOSIS — Z87891 Personal history of nicotine dependence: Secondary | ICD-10-CM

## 2023-04-09 DIAGNOSIS — G8929 Other chronic pain: Secondary | ICD-10-CM | POA: Insufficient documentation

## 2023-04-09 DIAGNOSIS — D61818 Other pancytopenia: Secondary | ICD-10-CM

## 2023-04-09 DIAGNOSIS — Z807 Family history of other malignant neoplasms of lymphoid, hematopoietic and related tissues: Secondary | ICD-10-CM

## 2023-04-09 DIAGNOSIS — Z809 Family history of malignant neoplasm, unspecified: Secondary | ICD-10-CM | POA: Diagnosis not present

## 2023-04-09 DIAGNOSIS — M545 Low back pain, unspecified: Secondary | ICD-10-CM | POA: Insufficient documentation

## 2023-04-09 DIAGNOSIS — D649 Anemia, unspecified: Secondary | ICD-10-CM | POA: Insufficient documentation

## 2023-04-09 LAB — CBC WITH DIFFERENTIAL/PLATELET
Abs Immature Granulocytes: 0.01 10*3/uL (ref 0.00–0.07)
Basophils Absolute: 0 10*3/uL (ref 0.0–0.1)
Basophils Relative: 1 %
Eosinophils Absolute: 0.1 10*3/uL (ref 0.0–0.5)
Eosinophils Relative: 2 %
HCT: 30.9 % — ABNORMAL LOW (ref 39.0–52.0)
Hemoglobin: 10 g/dL — ABNORMAL LOW (ref 13.0–17.0)
Immature Granulocytes: 0 %
Lymphocytes Relative: 16 %
Lymphs Abs: 0.7 10*3/uL (ref 0.7–4.0)
MCH: 29.9 pg (ref 26.0–34.0)
MCHC: 32.4 g/dL (ref 30.0–36.0)
MCV: 92.2 fL (ref 80.0–100.0)
Monocytes Absolute: 0.3 10*3/uL (ref 0.1–1.0)
Monocytes Relative: 6 %
Neutro Abs: 3.4 10*3/uL (ref 1.7–7.7)
Neutrophils Relative %: 75 %
Platelets: 159 10*3/uL (ref 150–400)
RBC: 3.35 MIL/uL — ABNORMAL LOW (ref 4.22–5.81)
RDW: 13.2 % (ref 11.5–15.5)
WBC: 4.5 10*3/uL (ref 4.0–10.5)
nRBC: 0 % (ref 0.0–0.2)

## 2023-04-09 LAB — CMP (CANCER CENTER ONLY)
ALT: 15 U/L (ref 0–44)
AST: 14 U/L — ABNORMAL LOW (ref 15–41)
Albumin: 4.2 g/dL (ref 3.5–5.0)
Alkaline Phosphatase: 78 U/L (ref 38–126)
Anion gap: 4 — ABNORMAL LOW (ref 5–15)
BUN: 21 mg/dL (ref 8–23)
CO2: 29 mmol/L (ref 22–32)
Calcium: 9.4 mg/dL (ref 8.9–10.3)
Chloride: 107 mmol/L (ref 98–111)
Creatinine: 1.34 mg/dL — ABNORMAL HIGH (ref 0.61–1.24)
GFR, Estimated: 55 mL/min — ABNORMAL LOW (ref >60)
Glucose, Bld: 114 mg/dL — ABNORMAL HIGH (ref 70–99)
Potassium: 4 mmol/L (ref 3.5–5.1)
Sodium: 140 mmol/L (ref 135–145)
Total Bilirubin: 0.5 mg/dL (ref 0.0–1.2)
Total Protein: 7.1 g/dL (ref 6.5–8.1)

## 2023-04-09 LAB — VITAMIN B12: Vitamin B-12: 180 pg/mL (ref 180–914)

## 2023-04-09 NOTE — Progress Notes (Signed)
 [facility] CONSULT NOTE  Patient Care Team: Irven Coe, MD as PCP - General (Family Medicine)  CHIEF COMPLAINTS/PURPOSE OF CONSULTATION:  Pancytopenia.  ASSESSMENT & PLAN:  Other pancytopenia (HCC) Pancytopenia Progressive decrease in all blood cell lines over the past year. Noted worsening anemia with hemoglobin about 8.5. No clear etiology identified yet. Fatigue reported, possibly related to anemia. No B symptoms suggestive of lymphoma. Family history of neoplastic diseases noted. -Order labs including B12, folic acid, thyroid function tests, ANA, myeloma eval and flow. -Consider bone marrow biopsy if labs do not provide a clear diagnosis.  Diabetes Controlled on Rybelsus (semaglutide). Fatigue initially attributed to change in diabetic medication. -Continue current management.  Musculoskeletal issues Chronic lower back and cervical disc disease affecting mobility and contributing to fatigue. -Continue current management.  Follow-up in 2 weeks to review lab results and discuss potential need for bone marrow biopsy.  Orders Placed This Encounter  Procedures   CBC with Differential/Platelet    Standing Status:   Standing    Number of Occurrences:   22    Expiration Date:   04/08/2024   CMP (Cancer Center only)    Standing Status:   Future    Number of Occurrences:   1    Expiration Date:   04/08/2024   Vitamin B12    Standing Status:   Future    Number of Occurrences:   1    Expiration Date:   04/08/2024   Folate RBC    Standing Status:   Future    Number of Occurrences:   1    Expiration Date:   04/08/2024   ANA, IFA (with reflex)    Standing Status:   Future    Number of Occurrences:   1    Expiration Date:   04/08/2024   TSH    Standing Status:   Standing    Number of Occurrences:   22    Expiration Date:   04/08/2024   Flow Cytometry, Peripheral Blood (Oncology)    Standing Status:   Future    Number of Occurrences:   1    Expected Date:    04/09/2023    Expiration Date:   04/08/2024   SPEP with reflex to IFE    Standing Status:   Future    Number of Occurrences:   1    Expiration Date:   04/08/2024   Kappa/lambda light chains    Standing Status:   Future    Number of Occurrences:   1    Expiration Date:   04/08/2024   IgG, IgA, IgM    Standing Status:   Future    Number of Occurrences:   1    Expiration Date:   04/08/2024     HISTORY OF PRESENTING ILLNESS:  Kenneth Lawson 78 y.o. male is here because of Pancytopenia.  Discussed the use of AI scribe software for clinical note transcription with the patient, who gave verbal consent to proceed.  History of Present Illness         Kenneth Lawson "Merlyn Albert" is a 78 year old male with pancytopenia who presents with worsening anemia and fatigue. He is accompanied by his wife, Mrs. Delanna Notice. He was referred by Dr. Lewie Chamber for evaluation of pancytopenia.  He is experiencing pancytopenia, characterized by low counts of white blood cells, red blood cells, and platelets. His hemoglobin level is about eight, significantly lower than the normal range for men. His white blood cell count has  been low for over a year, and his platelet count has also been low for some time as well.  He has significant fatigue according to wife, which he associates with a change in his diabetic medication to Rybelsus. This fatigue has worsened over the past year, impacting his ability to perform daily activities such as walking his dog. He used to walk three to four miles a day but now manages only about a mile. He also experiences musculoskeletal issues, including lumbar and cervical disc disease, which affect his mobility and exercise capacity.  He has a history of diabetes, which he manages with Rybelsus. He has experienced muscle pains, which improved after reducing his statin dosage. No recent infections requiring antibiotics, significant weight loss, or night sweats. He maintains a good appetite and  monitors his weight regularly.  His family history includes neoplastic diseases; his father and two siblings died of cancer-related diseases, including lymphoma and sarcoma. His paternal aunt died of pancreatic cancer. There is no history of malignancy on his mother's side, but there is a history of arthritis and dementia.  Socially, he has a background in the social security disability program and has been cautious about exposure to toxins. He drinks alcohol occasionally, about two to three drinks a month, and has never had a drinking problem.  Rest of the pertinent 10 point ROS reviewed and neg.   MEDICAL HISTORY:  Past Medical History:  Diagnosis Date   Arthritis    Diabetes mellitus without complication (HCC)    Hypercholesterolemia    Hypertension     SURGICAL HISTORY: Past Surgical History:  Procedure Laterality Date   LAPARASCOPIC MEDICAN ARCUATE LIGAMENT RELEASE     MINOR HEMORRHOIDECTOMY N/A     SOCIAL HISTORY: Social History   Socioeconomic History   Marital status: Married    Spouse name: Not on file   Number of children: Not on file   Years of education: Not on file   Highest education level: Not on file  Occupational History   Not on file  Tobacco Use   Smoking status: Former    Types: Cigarettes   Smokeless tobacco: Never  Vaping Use   Vaping status: Not on file  Substance and Sexual Activity   Alcohol use: Yes    Comment: rarely   Drug use: Never   Sexual activity: Not on file  Other Topics Concern   Not on file  Social History Narrative   Pt lives with wife    Retired    Social Drivers of Corporate investment banker Strain: Not on file  Food Insecurity: Not on file  Transportation Needs: Not on file  Physical Activity: High Risk (01/10/2023)   Received from [facility]   PCARE Exercise SDOH    Exercise: Indicated    PCare Exercise SDOH: Not on file    PCare Exercise SDOH: Not on file  Stress: Not on file  Social  Connections: Not on file  Intimate Partner Violence: Not on file    FAMILY HISTORY: Family History  Problem Relation Age of Onset   Alzheimer's disease Mother    Cancer Neg Hx    Parkinson's disease Neg Hx    Tremor Neg Hx     ALLERGIES:  is allergic to metformin.  MEDICATIONS:  Current Outpatient Medications  Medication Sig Dispense Refill   ACCU-CHEK GUIDE test strip USE TO CHECK BLOOD SUGARS THREE TIMES DAILY. 100 strip 2   Accu-Chek Softclix Lancets lancets 3 times daily As  directed DXE11.65 200 each 3   alfuzosin (UROXATRAL) 10 MG 24 hr tablet Take 10 mg by mouth daily.     Alogliptin-Pioglitazone 25-30 MG TABS Take 1 tablet by mouth daily. 90 tablet 3   ALPRAZolam (XANAX) 0.5 MG tablet Take 0.5 mg by mouth every other day as needed. Take 1/2 tablet by mouth every other day as needed.     atorvastatin (LIPITOR) 80 MG tablet TAKE 1 TABLET DAILY 90 tablet 0   buPROPion (WELLBUTRIN XL) 300 MG 24 hr tablet Indications: anxiousness associated with depression. Take 1 tablet by mouth every day     busPIRone (BUSPAR) 10 MG tablet Take 5-10 mg by mouth 2 (two) times daily as needed.     Eluxadoline (VIBERZI) 75 MG TABS Take by mouth 2 (two) times daily.     finasteride (PROSCAR) 5 MG tablet      gabapentin (NEURONTIN) 300 MG capsule Take by mouth.     glimepiride (AMARYL) 4 MG tablet TAKE 1 TABLET AT BREAKFAST AND TAKE 1/2  AT LUNCH IF EATING A MEAL, OTHERWISE SKIP 180 tablet 0   mometasone (NASONEX) 50 MCG/ACT nasal spray USE 2 SPRAYS IN EACH NOSTRIL NASALLY ONCE A DAY AS NEEDED  5   Semaglutide (RYBELSUS) 7 MG TABS Take 1 tablet daily before breakfast with half glass of water. Start after 3mg  runs out. (Do not fill until 3mg  are out) 90 tablet 1   tadalafil (CIALIS) 5 MG tablet Take 5 mg by mouth daily as needed for erectile dysfunction.     traMADol (ULTRAM) 50 MG tablet Take 1 tablet 3 times a day by oral route as needed for 30 days.     Vilazodone HCl 20 MG TABS Indications: major  depressive disorder. Take 1 tablet by mouth every day     No current facility-administered medications for this visit.     PHYSICAL EXAMINATION: ECOG PERFORMANCE STATUS: 0 - Asymptomatic  Vitals:   04/09/23 1456  BP: (!) 156/69  Pulse: 88  Resp: 16  Temp: 98.2 F (36.8 C)  SpO2: 97%   Filed Weights   04/09/23 1456  Weight: 165 lb 14.4 oz (75.3 kg)    GENERAL:alert, no distress and comfortable SKIN: skin color, texture, turgor are normal, no rashes or significant lesions EYES: normal, conjunctiva are pink and non-injected, sclera clear OROPHARYNX:no exudate, no erythema and lips, buccal mucosa, and tongue normal  NECK: supple, thyroid normal size, non-tender, without nodularity LYMPH:  no palpable lymphadenopathy in the cervical, axillary LUNGS: clear to auscultation and percussion with normal breathing effort HEART: regular rate & rhythm and no murmurs and no lower extremity edema ABDOMEN:abdomen soft, non-tender and normal bowel sounds Musculoskeletal:no cyanosis of digits and no clubbing  PSYCH: alert & oriented x 3 with fluent speech NEURO: no focal motor/sensory deficits  LABORATORY DATA:  I have reviewed the data as listed Lab Results  Component Value Date   WBC 4.5 04/09/2023   HGB 10.0 (L) 04/09/2023   HCT 30.9 (L) 04/09/2023   MCV 92.2 04/09/2023   PLT 159 04/09/2023     Chemistry      Component Value Date/Time   NA 141 12/27/2022 1013   NA 140 02/13/2019 1558   K 3.9 12/27/2022 1013   CL 108 12/27/2022 1013   CO2 27 12/27/2022 1013   BUN 19 12/27/2022 1013   BUN 19 02/13/2019 1558   CREATININE 0.94 12/27/2022 1013      Component Value Date/Time   CALCIUM 8.7 12/27/2022 1013  ALKPHOS 61 02/10/2022 0731   AST 12 (L) 02/10/2022 0731   ALT 12 02/10/2022 0731   BILITOT 0.5 02/10/2022 0731       RADIOGRAPHIC STUDIES: I have personally reviewed the radiological images as listed and agreed with the findings in the report. MR CERVICAL SPINE WO  CONTRAST Result Date: 03/27/2023 Table formatting from the original result was not included. [facility] NEUROIM[facility] REPORT STUDY DATE: 03/24/23 PATIENT NAME: Kenneth Lawson DOB: Dec 24, 1945 MRN: [id number] ORDERING CLINICIAN: Suanne Marker, MD CLINICAL HISTORY: 78 y.o. year old male with: 1. Cervical myelopathy (HCC)  2. Gait difficulty   EXAM: MR CERVICAL SPINE WO CONTRAST TECHNIQUE: MRI of the cervical spine was obtained utilizing multiplanar, multiecho pulse sequences. CONTRAST: Diagnostic Product Medications (last 72 hours)   None   COMPARISON: 07/06/20 CT IMAGING SITE: [facility] AT [address] [place] [address] FINDINGS: On sagittal views the vertebral bodies have normal height and alignment.  The spinal cord is normal in size and appearance. The posterior fossa, pituitary gland and paraspinal soft tissues are unremarkable.  On axial views: C2-3 no spinal stenosis or foraminal narrowing C3-4 disc bulging with no spinal stenosis and severe bilateral foraminal stenosis C4-5 disc bulging and facet hypertrophy with moderate to severe spinal stenosis and severe bilateral foraminal stenosis C5-6 disc bulging and facet hypertrophy with moderate to severe spinal stenosis and severe bilateral foraminal stenosis C6-7 disc bulging and facet hypertrophy with moderate spinal stenosis and severe bilateral foraminal stenosis C7-T1 no spinal stenosis or foraminal narrowing T1-2 no spinal stenosis or foraminal narrowing Limited views of the soft tissues of the head and neck are unremarkable.   MRI cervical spine without contrast demonstrating: - At C4-5, C5-6:  disc bulging and facet hypertrophy with moderate to severe spinal stenosis and severe bilateral foraminal stenosis; no cord signal abnormalities. - At C6-7 disc bulging and facet hypertrophy with moderate spinal stenosis and severe bilateral foraminal stenosis. INTERPRETING  PHYSICIAN: Suanne Marker, MD Certified in Neurology, Neurophysiology and Neuroimaging [facility] [address] [place], [place] [address] [phone]  MR BRAIN WO CONTRAST Result Date: 03/27/2023 Table formatting from the original result was not included. [facility] NEUROIMAG[facility] REPORT STUDY DATE: 03/24/23 PATIENT NAME: Kenneth Lawson DOB: [birth year] MRN: [id number] ORDERING CLINICIAN: Suanne Marker, MD CLINICAL HISTORY: 78 y.o. year old male with: 1. Cervical myelopathy (HCC)  2. Gait difficulty   EXAM: MR BRAIN WO CONTRAST TECHNIQUE: MRI of the brain without contrast was obtained utilizing multiplanar, multiecho pulse sequences. CONTRAST: Diagnostic Product Medications (last 72 hours)   None   COMPARISON: 07/06/20 CT IMAGING SITE: [facility] AT [address] [place] [address] FINDINGS: No abnormal lesions are seen on diffusion-weighted views to suggest acute ischemia. The cortical sulci, fissures and cisterns are normal in size and appearance. Lateral, third and fourth ventricle are normal in size and appearance. No extra-axial fluid collections are seen. No evidence of mass effect or midline shift.  Moderate to severe periventricular subcortical foci of chronic small vessel ischemic disease. On sagittal views the posterior fossa, pituitary gland and corpus callosum are unremarkable. No evidence of intracranial hemorrhage on SWI views. The orbits and their contents, paranasal sinuses and calvarium are notable for significant mucous thickening in the maxillary ethmoid and sphenoid sinuses.  Intracranial flow voids are present.   MRI brain (without) demonstrating: -Moderate to severe chronic small vessel ischemic  disease. -No acute findings. INTERPRETING PHYSICIAN: Suanne Marker, MD Certified in Neurology, Neurophysiology and Neuroimaging [facility] [address] [place], [place] [address] [phone]  DG Ribs Unilateral W/Chest Left Result Date: 03/22/2023 CLINICAL DATA:  Larey Seat.  Left rib pain. EXAM: LEFT RIBS AND CHEST - 3+ VIEW COMPARISON:  Chest x-ray 06/28/2018 FINDINGS: The cardiac silhouette, mediastinal and hilar contours are within normal limits and stable. The lungs are clear. No pulmonary contusion, pneumothorax or pleural effusion. Stable moderate eventration of the right hemidiaphragm. Dedicated views of the left ribs do not demonstrate any definite acute rib fractures. IMPRESSION: 1. No acute rib fractures. 2. No acute cardiopulmonary findings. Electronically Signed   By: Rudie Meyer M.D.   On: 03/22/2023 14:03    All questions were answered. The patient knows to call the clinic with any problems, questions or concerns. I spent 45 minutes in the care of this patient including H and P, review of records, counseling and coordination of care.     Rachel Moulds, MD 04/09/2023 4:22 PM

## 2023-04-09 NOTE — Assessment & Plan Note (Signed)
Pancytopenia Progressive decrease in all blood cell lines over the past year. Noted worsening anemia with hemoglobin about 8.5. No clear etiology identified yet. Fatigue reported, possibly related to anemia. No B symptoms suggestive of lymphoma. Family history of neoplastic diseases noted. -Order labs including B12, folic acid, thyroid function tests, ANA, myeloma eval and flow. -Consider bone marrow biopsy if labs do not provide a clear diagnosis.  Diabetes Controlled on Rybelsus (semaglutide). Fatigue initially attributed to change in diabetic medication. -Continue current management.  Musculoskeletal issues Chronic lower back and cervical disc disease affecting mobility and contributing to fatigue. -Continue current management.  Follow-up in 2 weeks to review lab results and discuss potential need for bone marrow biopsy.

## 2023-04-10 ENCOUNTER — Other Ambulatory Visit (HOSPITAL_COMMUNITY): Payer: Self-pay

## 2023-04-10 ENCOUNTER — Telehealth: Payer: Self-pay

## 2023-04-10 LAB — KAPPA/LAMBDA LIGHT CHAINS
Kappa free light chain: 23.4 mg/L — ABNORMAL HIGH (ref 3.3–19.4)
Kappa, lambda light chain ratio: 1.63 (ref 0.26–1.65)
Lambda free light chains: 14.4 mg/L (ref 5.7–26.3)

## 2023-04-10 LAB — FOLATE RBC
Folate, Hemolysate: 308 ng/mL
Folate, RBC: 1000 ng/mL (ref >498)
Hematocrit: 30.8 % — ABNORMAL LOW (ref 37.5–51.0)

## 2023-04-10 LAB — TSH: TSH: 5.386 u[IU]/mL — ABNORMAL HIGH (ref 0.350–4.500)

## 2023-04-10 LAB — IGG, IGA, IGM
IgA: 184 mg/dL (ref 61–437)
IgG (Immunoglobin G), Serum: 1121 mg/dL (ref 603–1613)
IgM (Immunoglobulin M), Srm: 40 mg/dL (ref 15–143)

## 2023-04-10 NOTE — Telephone Encounter (Signed)
Pharmacy Patient Advocate Encounter  Received notification from [facility] that Prior Authorization for Rybelsus 7MG  tablets has been APPROVED from 03/11/2023 to 04/09/2024. Ran test claim, Copay is $87.64. This test claim was processed through [facility]- copay amounts may vary at other pharmacies due to pharmacy/plan contracts, or as the patient moves through the different stages of their insurance plan.   PA #/Case ID/Reference #: na, see approval letter indexed in pt's media  KEY: [id number]

## 2023-04-11 LAB — PROTEIN ELECTROPHORESIS, SERUM, WITH REFLEX
A/G Ratio: 1.4 (ref 0.7–1.7)
Albumin ELP: 3.9 g/dL (ref 2.9–4.4)
Alpha-1-Globulin: 0.2 g/dL (ref 0.0–0.4)
Alpha-2-Globulin: 0.5 g/dL (ref 0.4–1.0)
Beta Globulin: 0.9 g/dL (ref 0.7–1.3)
Gamma Globulin: 1 g/dL (ref 0.4–1.8)
Globulin, Total: 2.7 g/dL (ref 2.2–3.9)
Total Protein ELP: 6.6 g/dL (ref 6.0–8.5)

## 2023-04-11 LAB — SURGICAL PATHOLOGY

## 2023-04-12 ENCOUNTER — Encounter: Payer: Self-pay | Admitting: *Deleted

## 2023-04-12 LAB — FLOW CYTOMETRY

## 2023-04-16 LAB — ANTINUCLEAR ANTIBODIES, IFA: ANA Ab, IFA: NEGATIVE

## 2023-04-22 ENCOUNTER — Telehealth: Payer: Self-pay

## 2023-04-22 NOTE — Telephone Encounter (Signed)
 Left message on voicemail about confirming visit on 04/23/23

## 2023-04-22 NOTE — Progress Notes (Unsigned)
 [facility] CONSULT NOTE  Patient Care Team: Irven Coe, MD as PCP - General (Family Medicine)  CHIEF COMPLAINTS/PURPOSE OF CONSULTATION:  Pancytopenia.  ASSESSMENT & PLAN:  Other pancytopenia (HCC) Anemia  -Spontaneously improved from his baseline Hb to most recent labs, wife says its because he started eating better. -No evidence of autoimmune problems, multiple myeloma, or lymphoma on blood work. B12 deficiency noted, which may be contributing to anemia. -Continue B12 supplementation. -Check labs in 3 months to assess response to B12 supplementation.  Abnormal TSH Mildly abnormal TSH noted, but not enough to warrant treatment at this time. -Recommend follow-up with endocrinologist and recheck TSH and free T4 with primary care physician, Dr. Lewie Chamber.  Nutrition Discussed importance of balanced diet, avoiding refined sugars, and moderate alcohol consumption. -Continue focus on balanced diet with adequate protein and whole foods.  Follow-up in 3 months or sooner if patient feels unwell.   No orders of the defined types were placed in this encounter.    HISTORY OF PRESENTING ILLNESS:  Kenneth Lawson 78 y.o. male is here because of Pancytopenia.  Discussed the use of AI scribe software for clinical note transcription with the patient, who gave verbal consent to proceed.  History of Present Illness    Kenneth Lawson "Kenneth Lawson" is a 78 year old male who presents for follow-up on B12 deficiency and anemia. He was referred by Dr. Lewie Chamber for evaluation of pancytopenia.  He is here for a follow-up on his pancytopenia, initially diagnosed by Dr. Lewie Chamber, which has shown improvement. His hemoglobin level was previously in the eights but has improved to ten. His white blood cell and platelet counts, which were previously low, are now within normal ranges. The primary concern identified was a deficiency in B12 reserves, which is essential for blood cell production. He has  started taking B12 supplements.  There were no findings of autoimmune problems, multiple myeloma, or lymphoma in his blood work. His TSH level is slightly abnormal, but not to a degree that requires treatment at this time. He plans to discuss this with his endocrinologist during an upcoming visit.  He has a family history of cancer, having lost his sister, brother, and mother to the disease. He is cautious about his diet, particularly avoiding refined sugars, which he believes are not good for cancer prevention.  He does not consume alcohol regularly, although he occasionally drinks beer and wine in moderation. He is mindful of his diet, focusing on whole foods and protein, and avoids refined sugars and tobacco. He typically has a large breakfast and sometimes skips lunch, but he is trying to eat dinner earlier to avoid eating too close to bedtime. No swelling in his legs and no pain during the examination. He is not a vegetarian but consumes less meat than he used to.  Rest of the pertinent 10 point ROS reviewed and neg.   MEDICAL HISTORY:  Past Medical History:  Diagnosis Date   Arthritis    Diabetes mellitus without complication (HCC)    Hypercholesterolemia    Hypertension     SURGICAL HISTORY: Past Surgical History:  Procedure Laterality Date   LAPARASCOPIC MEDICAN ARCUATE LIGAMENT RELEASE     MINOR HEMORRHOIDECTOMY N/A     SOCIAL HISTORY: Social History   Socioeconomic History   Marital status: Married    Spouse name: Not on file   Number of children: Not on file   Years of education: Not on file   Highest education level: Not on file  Occupational History   Not on file  Tobacco Use   Smoking status: Former    Types: Cigarettes   Smokeless tobacco: Never  Vaping Use   Vaping status: Not on file  Substance and Sexual Activity   Alcohol use: Yes    Comment: rarely   Drug use: Never   Sexual activity: Not on file  Other Topics Concern   Not on file  Social  History Narrative   Pt lives with wife    Retired    Social Drivers of Corporate investment banker Strain: Not on file  Food Insecurity: Not on file  Transportation Needs: Not on file  Physical Activity: High Risk (01/10/2023)   Received from [facility]   PCARE Exercise SDOH    Exercise: Indicated    PCare Exercise SDOH: Not on file    PCare Exercise SDOH: Not on file  Stress: Not on file  Social Connections: Not on file  Intimate Partner Violence: Not on file    FAMILY HISTORY: Family History  Problem Relation Age of Onset   Alzheimer's disease Mother    Cancer Neg Hx    Parkinson's disease Neg Hx    Tremor Neg Hx     ALLERGIES:  is allergic to metformin.  MEDICATIONS:  Current Outpatient Medications  Medication Sig Dispense Refill   ACCU-CHEK GUIDE test strip USE TO CHECK BLOOD SUGARS THREE TIMES DAILY. 100 strip 2   Accu-Chek Softclix Lancets lancets 3 times daily As directed DXE11.65 200 each 3   alfuzosin (UROXATRAL) 10 MG 24 hr tablet Take 10 mg by mouth daily.     Alogliptin-Pioglitazone 25-30 MG TABS Take 1 tablet by mouth daily. 90 tablet 3   ALPRAZolam (XANAX) 0.5 MG tablet Take 0.5 mg by mouth every other day as needed. Take 1/2 tablet by mouth every other day as needed.     atorvastatin (LIPITOR) 80 MG tablet TAKE 1 TABLET DAILY 90 tablet 0   buPROPion (WELLBUTRIN XL) 300 MG 24 hr tablet Indications: anxiousness associated with depression. Take 1 tablet by mouth every day     busPIRone (BUSPAR) 10 MG tablet Take 5-10 mg by mouth 2 (two) times daily as needed.     Eluxadoline (VIBERZI) 75 MG TABS Take by mouth 2 (two) times daily.     finasteride (PROSCAR) 5 MG tablet      gabapentin (NEURONTIN) 300 MG capsule Take by mouth.     glimepiride (AMARYL) 4 MG tablet TAKE 1 TABLET AT BREAKFAST AND TAKE 1/2  AT LUNCH IF EATING A MEAL, OTHERWISE SKIP 180 tablet 0   mometasone (NASONEX) 50 MCG/ACT nasal spray USE 2 SPRAYS IN EACH NOSTRIL NASALLY ONCE A  DAY AS NEEDED  5   Semaglutide (RYBELSUS) 7 MG TABS Take 1 tablet daily before breakfast with half glass of water. Start after 3mg  runs out. (Do not fill until 3mg  are out) 90 tablet 1   tadalafil (CIALIS) 5 MG tablet Take 5 mg by mouth daily as needed for erectile dysfunction.     traMADol (ULTRAM) 50 MG tablet Take 1 tablet 3 times a day by oral route as needed for 30 days.     Vilazodone HCl 20 MG TABS Indications: major depressive disorder. Take 1 tablet by mouth every day     No current facility-administered medications for this visit.     PHYSICAL EXAMINATION: ECOG PERFORMANCE STATUS: 0 - Asymptomatic  Vitals:   04/23/23 0944  BP: (!) 148/65  Pulse:  81  Resp: 16  Temp: 98.1 F (36.7 C)  SpO2: 99%    Filed Weights   04/23/23 0944  Weight: 164 lb 11.2 oz (74.7 kg)     GENERAL:alert, no distress and comfortable SKIN: skin color, texture, turgor are normal, no rashes or significant lesions EYES: normal, conjunctiva are pink and non-injected, sclera clear OROPHARYNX:no exudate, no erythema and lips, buccal mucosa, and tongue normal  NECK: supple, thyroid normal size, non-tender, without nodularity LYMPH:  no palpable lymphadenopathy in the cervical, axillary LUNGS: clear to auscultation and percussion with normal breathing effort HEART: regular rate & rhythm and no murmurs and no lower extremity edema ABDOMEN:abdomen soft, non-tender and normal bowel sounds Musculoskeletal:no cyanosis of digits and no clubbing  PSYCH: alert & oriented x 3 with fluent speech NEURO: no focal motor/sensory deficits  LABORATORY DATA:  I have reviewed the data as listed Lab Results  Component Value Date   WBC 4.5 04/09/2023   HGB 10.0 (L) 04/09/2023   HCT 30.8 (L) 04/09/2023   HCT 30.9 (L) 04/09/2023   MCV 92.2 04/09/2023   PLT 159 04/09/2023     Chemistry      Component Value Date/Time   NA 140 04/09/2023 1557   NA 140 02/13/2019 1558   K 4.0 04/09/2023 1557   CL 107 04/09/2023  1557   CO2 29 04/09/2023 1557   BUN 21 04/09/2023 1557   BUN 19 02/13/2019 1558   CREATININE 1.34 (H) 04/09/2023 1557      Component Value Date/Time   CALCIUM 9.4 04/09/2023 1557   ALKPHOS 78 04/09/2023 1557   AST 14 (L) 04/09/2023 1557   ALT 15 04/09/2023 1557   BILITOT 0.5 04/09/2023 1557       RADIOGRAPHIC STUDIES: I have personally reviewed the radiological images as listed and agreed with the findings in the report. No results found.   All questions were answered. The patient knows to call the clinic with any problems, questions or concerns. I spent 30 minutes in the care of this patient including H and P, review of records, counseling and coordination of care.     Rachel Moulds, MD 04/23/2023 12:53 PM

## 2023-04-23 ENCOUNTER — Inpatient Hospital Stay: Payer: Federal, State, Local not specified - PPO | Admitting: Hematology and Oncology

## 2023-04-23 VITALS — BP 148/65 | HR 81 | Temp 98.1°F | Resp 16 | Wt 164.7 lb

## 2023-04-23 DIAGNOSIS — D61818 Other pancytopenia: Secondary | ICD-10-CM

## 2023-04-23 NOTE — Assessment & Plan Note (Signed)
 Anemia  -Spontaneously improved from his baseline Hb to most recent labs, wife says its because he started eating better. -No evidence of autoimmune problems, multiple myeloma, or lymphoma on blood work. B12 deficiency noted, which may be contributing to anemia. -Continue B12 supplementation. -Check labs in 3 months to assess response to B12 supplementation.  Abnormal TSH Mildly abnormal TSH noted, but not enough to warrant treatment at this time. -Recommend follow-up with endocrinologist and recheck TSH and free T4 with primary care physician, Dr. Lewie Chamber.  Nutrition Discussed importance of balanced diet, avoiding refined sugars, and moderate alcohol consumption. -Continue focus on balanced diet with adequate protein and whole foods.  Follow-up in 3 months or sooner if patient feels unwell.

## 2023-04-26 ENCOUNTER — Ambulatory Visit (INDEPENDENT_AMBULATORY_CARE_PROVIDER_SITE_OTHER): Payer: Federal, State, Local not specified - PPO | Admitting: Endocrinology

## 2023-04-26 ENCOUNTER — Encounter: Payer: Self-pay | Admitting: Endocrinology

## 2023-04-26 VITALS — BP 136/70 | HR 68 | Resp 16 | Ht 68.9 in | Wt 164.4 lb

## 2023-04-26 DIAGNOSIS — Z7984 Long term (current) use of oral hypoglycemic drugs: Secondary | ICD-10-CM | POA: Diagnosis not present

## 2023-04-26 DIAGNOSIS — E1165 Type 2 diabetes mellitus with hyperglycemia: Secondary | ICD-10-CM

## 2023-04-26 DIAGNOSIS — E119 Type 2 diabetes mellitus without complications: Secondary | ICD-10-CM

## 2023-04-26 LAB — POCT GLYCOSYLATED HEMOGLOBIN (HGB A1C): Hemoglobin A1C: 5.7 % — AB (ref 4.0–5.6)

## 2023-04-26 MED ORDER — RYBELSUS 7 MG PO TABS
ORAL_TABLET | ORAL | 3 refills | Status: DC
Start: 2023-04-26 — End: 2023-06-24

## 2023-04-26 MED ORDER — GLIMEPIRIDE 4 MG PO TABS: ORAL_TABLET | ORAL | 0 refills | Status: DC

## 2023-04-26 MED ORDER — ALOGLIPTIN-PIOGLITAZONE 25-30 MG PO TABS: 1.0000 | ORAL_TABLET | Freq: Every day | ORAL | 3 refills | Status: DC

## 2023-04-26 NOTE — Progress Notes (Addendum)
 Outpatient Endocrinology Note Kenneth Yue Glasheen, MD  04/29/23  Patient's Name: Kenneth Lawson    DOB: Dec 05, 1945    MRN: [id number]                                                    REASON OF VISIT: Follow up of type 2 diabetes mellitus  PCP: Irven Coe, MD  HISTORY OF PRESENT ILLNESS:   Kenneth Lawson is a 78 y.o. old male with past medical history listed below, is here for follow up for type 2 diabetes mellitus.   Pertinent Diabetes History: Patient was diagnosed with type 2 diabetes mellitus in 1997.  He has controlled type 2 diabetes mellitus.  Chronic Diabetes Complications : Retinopathy: no. Last ophthalmology exam was done on annually, July 2024, following with ophthalmology regularly.  Nephropathy: no Peripheral neuropathy: no Coronary artery disease: no Stroke: no  Relevant comorbidities and cardiovascular risk factors: Obesity: no Body mass index is 24.35 kg/m.  Hypertension: Yes  Hyperlipidemia : Yes, on statin   Current / Home Diabetic regimen includes: Amaryl / Glimepiride 2 mg as needed.  Rybelsus 7 mg daily.  Alogliptin / pioglitazone ( Oseni) 25-30mg  daily with dinner.   Prior diabetic medications: Metformin stopped due to GI side effects.  He had been on Invokana /Jardiance in the past, stopped due to balanitis.  Glycemic data:   Using Accu-Chek guide glucometer.  Not able to download glucometer in the clinic today.  Some of the blood sugar reviewed from last 1 month as follows.  He has been checking blood sugar occasionally at different times of the day. 92, 132, 129, 97, 208  Hypoglycemia: Patient has denies hypoglycemic episodes. Patient has hypoglycemia awareness.  Factors modifying glucose control: 1.  Diabetic diet assessment: 3 meals.   2.  Staying active or exercising: walk, plays grand kids basketball, football.   3.  Medication compliance: compliant all of the time.  Interval history  Hemoglobin A1c today 5.7%.  Diabetes has been  reviewed and as noted above.  He denies numbness and ting of the foot.  No GI related issues taking Rybelsus.  No other complaints today.  REVIEW OF SYSTEMS As per history of present illness.   PAST MEDICAL HISTORY: Past Medical History:  Diagnosis Date   Arthritis    Diabetes mellitus without complication (HCC)    Hypercholesterolemia    Hypertension     PAST SURGICAL HISTORY: Past Surgical History:  Procedure Laterality Date   LAPARASCOPIC MEDICAN ARCUATE LIGAMENT RELEASE     MINOR HEMORRHOIDECTOMY N/A     ALLERGIES: Allergies  Allergen Reactions   Metformin Diarrhea and Other (See Comments)    FAMILY HISTORY:  Family History  Problem Relation Age of Onset   Alzheimer's disease Mother    Cancer Neg Hx    Parkinson's disease Neg Hx    Tremor Neg Hx     SOCIAL HISTORY: Social History   Socioeconomic History   Marital status: Married    Spouse name: Not on file   Number of children: Not on file   Years of education: Not on file   Highest education level: Not on file  Occupational History   Not on file  Tobacco Use   Smoking status: Former    Types: Cigarettes   Smokeless tobacco: Never  Vaping Use   Vaping status: Not  on file  Substance and Sexual Activity   Alcohol use: Yes    Comment: rarely   Drug use: Never   Sexual activity: Not on file  Other Topics Concern   Not on file  Social History Narrative   Pt lives with wife    Retired    Social Drivers of Corporate investment banker Strain: Not on file  Food Insecurity: Not on file  Transportation Needs: Not on file  Physical Activity: High Risk (01/10/2023)   Received from [facility]   PCARE Exercise SDOH    Exercise: Indicated    PCare Exercise SDOH: Not on file    PCare Exercise SDOH: Not on file  Stress: Not on file  Social Connections: Not on file    MEDICATIONS:  Current Outpatient Medications  Medication Sig Dispense Refill   ACCU-CHEK GUIDE test strip USE TO CHECK  BLOOD SUGARS THREE TIMES DAILY. 100 strip 2   Accu-Chek Softclix Lancets lancets 3 times daily As directed DXE11.65 200 each 3   alfuzosin (UROXATRAL) 10 MG 24 hr tablet Take 10 mg by mouth daily.     ALPRAZolam (XANAX) 0.5 MG tablet Take 0.5 mg by mouth every other day as needed. Take 1/2 tablet by mouth every other day as needed.     atorvastatin (LIPITOR) 80 MG tablet TAKE 1 TABLET DAILY 90 tablet 0   buPROPion (WELLBUTRIN XL) 300 MG 24 hr tablet Indications: anxiousness associated with depression. Take 1 tablet by mouth every day     busPIRone (BUSPAR) 10 MG tablet Take 5-10 mg by mouth 2 (two) times daily as needed.     Eluxadoline (VIBERZI) 75 MG TABS Take by mouth 2 (two) times daily.     finasteride (PROSCAR) 5 MG tablet      gabapentin (NEURONTIN) 300 MG capsule Take by mouth.     mometasone (NASONEX) 50 MCG/ACT nasal spray USE 2 SPRAYS IN EACH NOSTRIL NASALLY ONCE A DAY AS NEEDED  5   tadalafil (CIALIS) 5 MG tablet Take 5 mg by mouth daily as needed for erectile dysfunction.     traMADol (ULTRAM) 50 MG tablet Take 1 tablet 3 times a day by oral route as needed for 30 days.     Vilazodone HCl 20 MG TABS Indications: major depressive disorder. Take 1 tablet by mouth every day     Alogliptin-Pioglitazone 25-30 MG TABS Take 1 tablet by mouth daily. 90 tablet 3   glimepiride (AMARYL) 4 MG tablet TAKE 1 TABLET AT BREAKFAST AND TAKE 1/2  AT LUNCH IF EATING A MEAL, OTHERWISE SKIP 180 tablet 0   Semaglutide (RYBELSUS) 7 MG TABS Take 1 tablet daily before breakfast with half glass of water. Start after 3mg  runs out. (Do not fill until 3mg  are out) 90 tablet 3   No current facility-administered medications for this visit.    PHYSICAL EXAM: Vitals:   04/26/23 1018  BP: 136/70  Pulse: 68  Resp: 16  SpO2: 97%  Weight: 164 lb 6.4 oz (74.6 kg)  Height: 5' 8.9" (1.75 m)    Body mass index is 24.35 kg/m.  Wt Readings from Last 3 Encounters:  04/26/23 164 lb 6.4 oz (74.6 kg)  04/23/23 164  lb 11.2 oz (74.7 kg)  04/09/23 165 lb 14.4 oz (75.3 kg)    General: Well developed, well nourished male in no apparent distress.  HEENT: AT/[place], no external lesions.  Eyes: Conjunctiva clear and no icterus. Neck: Neck supple  Lungs: Respirations not  labored Neurologic: Alert, oriented, normal speech Extremities / Skin: Dry.  Psychiatric: Does not appear depressed or anxious  Diabetic Foot Exam - Simple   Simple Foot Form Diabetic Foot exam was performed with the following findings: Yes 04/26/2023 10:37 AM  Visual Inspection No deformities, no ulcerations, no other skin breakdown bilaterally: Yes See comments: Yes Sensation Testing Intact to touch and monofilament testing bilaterally: Yes Pulse Check Posterior Tibialis and Dorsalis pulse intact bilaterally: Yes Comments Left second deformity from trauma.     LABS Reviewed Lab Results  Component Value Date   HGBA1C 5.7 (A) 04/26/2023   HGBA1C 5.8 (A) 12/27/2022   HGBA1C 6.3 08/21/2022   Lab Results  Component Value Date   FRUCTOSAMINE 263 03/24/2021   FRUCTOSAMINE 276 04/18/2020   FRUCTOSAMINE 244 06/01/2019   Lab Results  Component Value Date   CHOL 160 12/27/2022   HDL 56.40 12/27/2022   LDLCALC 92 12/27/2022   LDLDIRECT 134.6 11/18/2013   TRIG 54.0 12/27/2022   CHOLHDL 3 12/27/2022   Lab Results  Component Value Date   MICRALBCREAT 0.6 12/27/2022   MICRALBCREAT 0.9 10/26/2021   Lab Results  Component Value Date   CREATININE 1.34 (H) 04/09/2023   Lab Results  Component Value Date   GFR 79.29 12/27/2022    ASSESSMENT / PLAN  1. Controlled type 2 diabetes mellitus without complication, without long-term current use of insulin (HCC)   2. Type 2 diabetes mellitus with hyperglycemia, without long-term current use of insulin (HCC)    Diabetes Mellitus type 2, complicated by no known complications. - Diabetic status / severity: Controlled.  Lab Results  Component Value Date   HGBA1C 5.7 (A) 04/26/2023     - Hemoglobin A1c goal : <6.5%  Hemoglobin A1c is 5.7% today.  Hemoglobin A1c is likely falsely low due to anemia.  Most of the blood sugar on glucometer readings are acceptable.  However he has not been checking much.   - Medications: No change. Amaryl / Glimepiride 2 mg as needed, he takes for hyperglycemia or extra-large meals. Continue Rybelsus 7 mg daily.  Continue alogliptin / pioglitazone ( Oseni) 25-30mg  daily with dinner.   - Home glucose testing: Check in the morning fasting and at bedtime few times a week. - Discussed/ Gave Hypoglycemia treatment plan.  # Consult : not required at this time.   # Annual urine for microalbuminuria/ creatinine ratio, no microalbuminuria currently.  Last  Lab Results  Component Value Date   MICRALBCREAT 0.6 12/27/2022    # Foot check nightly.  # Annual dilated diabetic eye exams.   - Diet: Eat reasonable portion sizes to promote a healthy weight - Life style / activity / exercise: Discussed.  2. Blood pressure  -  BP Readings from Last 1 Encounters:  04/26/23 136/70    - Control is not in target. Mildly high blood pressure. - No change in current plans.  Managed by primary care provider.  3. Lipid status / Hyperlipidemia - Last  Lab Results  Component Value Date   LDLCALC 92 12/27/2022   - Continue atorvastatin 80 mg daily.    Diagnoses and all orders for this visit:  Controlled type 2 diabetes mellitus without complication, without long-term current use of insulin (HCC) -     POCT glycosylated hemoglobin (Hb A1C) -     Semaglutide (RYBELSUS) 7 MG TABS; Take 1 tablet daily before breakfast with half glass of water. Start after 3mg  runs out. (Do not fill until 3mg  are out)  Type 2 diabetes mellitus with hyperglycemia, without long-term current use of insulin (HCC) -     Alogliptin-Pioglitazone 25-30 MG TABS; Take 1 tablet by mouth daily. -     glimepiride (AMARYL) 4 MG tablet; TAKE 1 TABLET AT BREAKFAST AND TAKE 1/2  AT  LUNCH IF EATING A MEAL, OTHERWISE SKIP   DISPOSITION Follow up in clinic in  4 months suggested.   All questions answered and patient verbalized understanding of the plan.  Kenneth Xin Klawitter, MD [facility] [address] [place], [address] [place], [place] [address] Phone # [phone]  At least part of this note was generated using voice recognition software. Inadvertent word errors may have occurred, which were not recognized during the proofreading process.

## 2023-06-11 ENCOUNTER — Ambulatory Visit: Payer: Medicare Other | Admitting: Neurology

## 2023-06-22 ENCOUNTER — Other Ambulatory Visit: Payer: Self-pay | Admitting: Endocrinology

## 2023-06-22 DIAGNOSIS — E119 Type 2 diabetes mellitus without complications: Secondary | ICD-10-CM

## 2023-07-12 ENCOUNTER — Other Ambulatory Visit: Payer: Self-pay

## 2023-07-12 DIAGNOSIS — E7800 Pure hypercholesterolemia, unspecified: Secondary | ICD-10-CM

## 2023-07-12 MED ORDER — ATORVASTATIN CALCIUM 80 MG PO TABS
80.0000 mg | ORAL_TABLET | Freq: Every day | ORAL | 0 refills | Status: DC
Start: 2023-07-12 — End: 2023-09-23

## 2023-07-24 ENCOUNTER — Telehealth: Payer: Self-pay

## 2023-07-24 NOTE — Telephone Encounter (Signed)
 Left a voicemail to confirm appointments for 5/29

## 2023-07-25 ENCOUNTER — Inpatient Hospital Stay (HOSPITAL_BASED_OUTPATIENT_CLINIC_OR_DEPARTMENT_OTHER): Payer: Federal, State, Local not specified - PPO | Admitting: Hematology and Oncology

## 2023-07-25 ENCOUNTER — Inpatient Hospital Stay: Payer: Federal, State, Local not specified - PPO | Attending: Hematology and Oncology

## 2023-07-25 VITALS — BP 138/63 | HR 80 | Temp 98.3°F | Resp 18 | Ht 68.9 in | Wt 166.2 lb

## 2023-07-25 DIAGNOSIS — E7800 Pure hypercholesterolemia, unspecified: Secondary | ICD-10-CM | POA: Diagnosis not present

## 2023-07-25 DIAGNOSIS — D61818 Other pancytopenia: Secondary | ICD-10-CM | POA: Diagnosis present

## 2023-07-25 DIAGNOSIS — Z7985 Long-term (current) use of injectable non-insulin antidiabetic drugs: Secondary | ICD-10-CM | POA: Insufficient documentation

## 2023-07-25 DIAGNOSIS — Z87891 Personal history of nicotine dependence: Secondary | ICD-10-CM | POA: Diagnosis not present

## 2023-07-25 DIAGNOSIS — E119 Type 2 diabetes mellitus without complications: Secondary | ICD-10-CM | POA: Insufficient documentation

## 2023-07-25 DIAGNOSIS — R5383 Other fatigue: Secondary | ICD-10-CM | POA: Insufficient documentation

## 2023-07-25 DIAGNOSIS — E538 Deficiency of other specified B group vitamins: Secondary | ICD-10-CM | POA: Insufficient documentation

## 2023-07-25 LAB — CBC WITH DIFFERENTIAL/PLATELET
Abs Immature Granulocytes: 0.02 10*3/uL (ref 0.00–0.07)
Basophils Absolute: 0 10*3/uL (ref 0.0–0.1)
Basophils Relative: 1 %
Eosinophils Absolute: 0.1 10*3/uL (ref 0.0–0.5)
Eosinophils Relative: 2 %
HCT: 29.7 % — ABNORMAL LOW (ref 39.0–52.0)
Hemoglobin: 9.6 g/dL — ABNORMAL LOW (ref 13.0–17.0)
Immature Granulocytes: 1 %
Lymphocytes Relative: 15 %
Lymphs Abs: 0.6 10*3/uL — ABNORMAL LOW (ref 0.7–4.0)
MCH: 29 pg (ref 26.0–34.0)
MCHC: 32.3 g/dL (ref 30.0–36.0)
MCV: 89.7 fL (ref 80.0–100.0)
Monocytes Absolute: 0.3 10*3/uL (ref 0.1–1.0)
Monocytes Relative: 7 %
Neutro Abs: 3.1 10*3/uL (ref 1.7–7.7)
Neutrophils Relative %: 74 %
Platelets: 123 10*3/uL — ABNORMAL LOW (ref 150–400)
RBC: 3.31 MIL/uL — ABNORMAL LOW (ref 4.22–5.81)
RDW: 13.1 % (ref 11.5–15.5)
WBC: 4.1 10*3/uL (ref 4.0–10.5)
nRBC: 0 % (ref 0.0–0.2)

## 2023-07-25 LAB — TSH: TSH: 4.75 u[IU]/mL — ABNORMAL HIGH (ref 0.350–4.500)

## 2023-07-25 LAB — VITAMIN B12: Vitamin B-12: 367 pg/mL (ref 180–914)

## 2023-07-25 NOTE — Progress Notes (Signed)
 [facility] CONSULT NOTE  Patient Care Team: Benedetto Brady, MD as PCP - General (Family Medicine)  CHIEF COMPLAINTS/PURPOSE OF CONSULTATION:  Pancytopenia.  ASSESSMENT & PLAN:  Other pancytopenia (HCC) Assessment and Plan Assessment & Plan Anemia Chronic anemia with hemoglobin at 9.6. Platelet count at 123,000 is stable. Previous tests ruled out multiple myeloma and autoimmune diseases. - Re-evaluate hemoglobin levels in eight weeks. - Consider bone marrow biopsy if no improvement in hemoglobin levels after B12 injections.  Vitamin B12 deficiency Vitamin B12 deficiency identified as a potential cause of anemia. Oral supplementation may not have been absorbed adequately. Switching to injections to improve absorption. - Prescribe vitamin B12 injections weekly for eight weeks. - Discontinue oral B12 supplementation during injection treatment. - [facility] [address] - If no improvement in Hb despite B12 supplementation, we will consider BMB      Orders Placed This Encounter  Procedures   Vitamin B12    Standing Status:   Standing    Number of Occurrences:   20    Expiration Date:   07/24/2024     HISTORY OF PRESENTING ILLNESS:  Kenneth Lawson 78 y.o. male is here because of Pancytopenia.  History of Present Illness    Kenneth Lawson "Kenneth Lawson" is a 78 year old male who presents for follow-up on B12 deficiency and anemia.   Discussed the use of AI scribe software for clinical note transcription with the patient, who gave verbal consent to proceed.  History of Present Illness Edgerrin Correia "Kenneth Lawson" is a 78 year old male who presents for follow-up of his blood counts.  His health has been stable and improved since the last visit. He is currently taking B12 and vitamin D supplements. Recent blood work shows a hemoglobin level of 9.6 and a platelet count of 123,000.   In February, he underwent a biopsy and  extensive blood work, which did not reveal any significant findings except for low B12 levels. Tests for multiple myeloma and autoimmune diseases were negative.  His nutrition is a work in progress, with efforts to increase protein intake. His weight has been stable at 66 kg. He experiences variable sleep quality, which improves with good nutrition and exercise. He has been able to walk more, although recent weather has limited his activity.  No swelling, fevers, or night sweats. His appetite is good, and his weight has been stable. He notes that he can still gain some weight, particularly in areas like his armpits, which he describes as 'still empty'.    Rest of the pertinent 10 point ROS reviewed and neg.   MEDICAL HISTORY:  Past Medical History:  Diagnosis Date   Arthritis    Diabetes mellitus without complication (HCC)    Hypercholesterolemia    Hypertension     SURGICAL HISTORY: Past Surgical History:  Procedure Laterality Date   LAPARASCOPIC MEDICAN ARCUATE LIGAMENT RELEASE     MINOR HEMORRHOIDECTOMY N/A     SOCIAL HISTORY: Social History   Socioeconomic History   Marital status: Married    Spouse name: Not on file   Number of children: Not on file   Years of education: Not on file   Highest education level: Not on file  Occupational History   Not on file  Tobacco Use   Smoking status: Former    Types: Cigarettes   Smokeless tobacco: Never  Vaping Use   Vaping status: Not on file  Substance and Sexual Activity  Alcohol use: Yes    Comment: rarely   Drug use: Never   Sexual activity: Not on file  Other Topics Concern   Not on file  Social History Narrative   Pt lives with wife    Retired    Social Drivers of Corporate investment banker Strain: Not on file  Food Insecurity: Not on file  Transportation Needs: Not on file  Physical Activity: Low Risk  (07/18/2023)   Received from [facility]   PCARE Exercise SDOH    Exercise: Aerobic     PCare Exercise SDOH: Not on file    PCare Exercise SDOH: Not on file  Stress: Not on file  Social Connections: Not on file  Intimate Partner Violence: Not on file    FAMILY HISTORY: Family History  Problem Relation Age of Onset   Alzheimer's disease Mother    Cancer Neg Hx    Parkinson's disease Neg Hx    Tremor Neg Hx     ALLERGIES:  is allergic to metformin.  MEDICATIONS:  Current Outpatient Medications  Medication Sig Dispense Refill   ACCU-CHEK GUIDE test strip USE TO CHECK BLOOD SUGARS THREE TIMES DAILY. 100 strip 2   Accu-Chek Softclix Lancets lancets 3 times daily As directed DXE11.65 200 each 3   alfuzosin (UROXATRAL) 10 MG 24 hr tablet Take 10 mg by mouth daily.     Alogliptin -Pioglitazone  25-30 MG TABS Take 1 tablet by mouth daily. 90 tablet 3   ALPRAZolam (XANAX) 0.5 MG tablet Take 0.5 mg by mouth every other day as needed. Take 1/2 tablet by mouth every other day as needed.     atorvastatin  (LIPITOR) 80 MG tablet Take 1 tablet (80 mg total) by mouth daily. 90 tablet 0   buPROPion (WELLBUTRIN XL) 300 MG 24 hr tablet Indications: anxiousness associated with depression. Take 1 tablet by mouth every day     busPIRone (BUSPAR) 10 MG tablet Take 5-10 mg by mouth 2 (two) times daily as needed.     Eluxadoline (VIBERZI) 75 MG TABS Take by mouth 2 (two) times daily.     finasteride (PROSCAR) 5 MG tablet      gabapentin (NEURONTIN) 300 MG capsule Take by mouth.     glimepiride  (AMARYL ) 4 MG tablet TAKE 1 TABLET AT BREAKFAST AND TAKE 1/2  AT LUNCH IF EATING A MEAL, OTHERWISE SKIP 180 tablet 0   mometasone (NASONEX) 50 MCG/ACT nasal spray USE 2 SPRAYS IN EACH NOSTRIL NASALLY ONCE A DAY AS NEEDED  5   Semaglutide  (RYBELSUS ) 7 MG TABS TAKE 1 TABLET DAILY BEFORE BREAKFAST WITH HALF GLASS  OF WATER. 90 tablet 1   tadalafil (CIALIS) 5 MG tablet Take 5 mg by mouth daily as needed for erectile dysfunction.     traMADol (ULTRAM) 50 MG tablet Take 1 tablet 3 times a day by oral route as  needed for 30 days.     Vilazodone HCl 20 MG TABS Indications: major depressive disorder. Take 1 tablet by mouth every day     No current facility-administered medications for this visit.     PHYSICAL EXAMINATION: ECOG PERFORMANCE STATUS: 0 - Asymptomatic  Vitals:   07/25/23 0954 07/25/23 0956  BP: (!) 143/62 138/63  Pulse: 80   Resp: 18   Temp: 98.3 F (36.8 C)   SpO2: 95%     Filed Weights   07/25/23 0954  Weight: 166 lb 3.2 oz (75.4 kg)     GENERAL:alert, no distress and comfortable SKIN:  skin color, texture, turgor are normal, no rashes or significant lesions EYES: normal, conjunctiva are pink and non-injected, sclera clear OROPHARYNX:no exudate, no erythema and lips, buccal mucosa, and tongue normal  NECK: supple, thyroid  normal size, non-tender, without nodularity LYMPH:  no palpable lymphadenopathy in the cervical, axillary LUNGS: clear to auscultation and percussion with normal breathing effort HEART: regular rate & rhythm and no murmurs and no lower extremity edema ABDOMEN:abdomen soft, non-tender and normal bowel sounds Musculoskeletal:no cyanosis of digits and no clubbing  PSYCH: alert & oriented x 3 with fluent speech NEURO: no focal motor/sensory deficits  LABORATORY DATA:  I have reviewed the data as listed Lab Results  Component Value Date   WBC 4.1 07/25/2023   HGB 9.6 (L) 07/25/2023   HCT 29.7 (L) 07/25/2023   MCV 89.7 07/25/2023   PLT 123 (L) 07/25/2023     Chemistry      Component Value Date/Time   NA 140 04/09/2023 1557   NA 140 02/13/2019 1558   K 4.0 04/09/2023 1557   CL 107 04/09/2023 1557   CO2 29 04/09/2023 1557   BUN 21 04/09/2023 1557   BUN 19 02/13/2019 1558   CREATININE 1.34 (H) 04/09/2023 1557      Component Value Date/Time   CALCIUM  9.4 04/09/2023 1557   ALKPHOS 78 04/09/2023 1557   AST 14 (L) 04/09/2023 1557   ALT 15 04/09/2023 1557   BILITOT 0.5 04/09/2023 1557       RADIOGRAPHIC STUDIES: I have personally  reviewed the radiological images as listed and agreed with the findings in the report. No results found.   All questions were answered. The patient knows to call the clinic with any problems, questions or concerns. I spent 30 minutes in the care of this patient including H and P, review of records, counseling and coordination of care.     Murleen Arms, MD 07/25/2023 12:26 PM

## 2023-07-25 NOTE — Assessment & Plan Note (Addendum)
 Assessment and Plan Assessment & Plan Anemia Chronic anemia with hemoglobin at 9.6. Platelet count at 123,000 is stable. Previous tests ruled out multiple myeloma and autoimmune diseases. - Re-evaluate hemoglobin levels in eight weeks. - Consider bone marrow biopsy if no improvement in hemoglobin levels after B12 injections.  Vitamin B12 deficiency Vitamin B12 deficiency identified as a potential cause of anemia. Oral supplementation may not have been absorbed adequately. Switching to injections to improve absorption. - Prescribe vitamin B12 injections weekly for eight weeks. - Discontinue oral B12 supplementation during injection treatment. - [facility] [address] - If no improvement in Hb despite B12 supplementation, we will consider BMB

## 2023-07-26 ENCOUNTER — Other Ambulatory Visit: Payer: Self-pay

## 2023-07-26 MED ORDER — CYANOCOBALAMIN 1000 MCG/ML IJ SOLN: 1000.0000 ug | INTRAMUSCULAR | 7 refills | Status: AC

## 2023-07-26 MED ORDER — BD DISP NEEDLE 23G X 1" MISC: 0 refills | Status: DC

## 2023-08-16 ENCOUNTER — Other Ambulatory Visit: Payer: Self-pay

## 2023-08-16 MED ORDER — BD DISP NEEDLE 23G X 1" MISC: 0 refills | Status: AC

## 2023-08-16 NOTE — Progress Notes (Signed)
 Pt's wife called and states they never received needles for B12 inj which were ordered 07/26/23 and [facility]. Per MD, refill sent

## 2023-08-23 ENCOUNTER — Ambulatory Visit: Payer: Federal, State, Local not specified - PPO | Admitting: Endocrinology

## 2023-09-03 LAB — LAB REPORT - SCANNED
A1c: 5.1
EGFR: 66

## 2023-09-22 ENCOUNTER — Other Ambulatory Visit: Payer: Self-pay | Admitting: Endocrinology

## 2023-09-22 DIAGNOSIS — E7800 Pure hypercholesterolemia, unspecified: Secondary | ICD-10-CM

## 2023-09-23 NOTE — Telephone Encounter (Signed)
 Refill request complete

## 2023-09-25 ENCOUNTER — Telehealth: Payer: Self-pay

## 2023-09-25 NOTE — Telephone Encounter (Signed)
 Pt verbally confirmed appt for 7/31

## 2023-09-26 ENCOUNTER — Inpatient Hospital Stay: Attending: Hematology and Oncology

## 2023-09-26 ENCOUNTER — Inpatient Hospital Stay (HOSPITAL_BASED_OUTPATIENT_CLINIC_OR_DEPARTMENT_OTHER): Admitting: Hematology and Oncology

## 2023-09-26 VITALS — BP 164/63 | HR 68 | Temp 98.1°F | Resp 20 | Wt 169.2 lb

## 2023-09-26 DIAGNOSIS — D519 Vitamin B12 deficiency anemia, unspecified: Secondary | ICD-10-CM | POA: Diagnosis not present

## 2023-09-26 DIAGNOSIS — Z79899 Other long term (current) drug therapy: Secondary | ICD-10-CM | POA: Insufficient documentation

## 2023-09-26 DIAGNOSIS — D61818 Other pancytopenia: Secondary | ICD-10-CM | POA: Insufficient documentation

## 2023-09-26 DIAGNOSIS — E538 Deficiency of other specified B group vitamins: Secondary | ICD-10-CM | POA: Diagnosis not present

## 2023-09-26 LAB — CBC WITH DIFFERENTIAL/PLATELET
Abs Immature Granulocytes: 0.01 K/uL (ref 0.00–0.07)
Basophils Absolute: 0 K/uL (ref 0.0–0.1)
Basophils Relative: 1 %
Eosinophils Absolute: 0.1 K/uL (ref 0.0–0.5)
Eosinophils Relative: 3 %
HCT: 30.9 % — ABNORMAL LOW (ref 39.0–52.0)
Hemoglobin: 10.3 g/dL — ABNORMAL LOW (ref 13.0–17.0)
Immature Granulocytes: 0 %
Lymphocytes Relative: 20 %
Lymphs Abs: 0.6 K/uL — ABNORMAL LOW (ref 0.7–4.0)
MCH: 30.3 pg (ref 26.0–34.0)
MCHC: 33.3 g/dL (ref 30.0–36.0)
MCV: 90.9 fL (ref 80.0–100.0)
Monocytes Absolute: 0.3 K/uL (ref 0.1–1.0)
Monocytes Relative: 8 %
Neutro Abs: 2.2 K/uL (ref 1.7–7.7)
Neutrophils Relative %: 68 %
Platelets: 123 K/uL — ABNORMAL LOW (ref 150–400)
RBC: 3.4 MIL/uL — ABNORMAL LOW (ref 4.22–5.81)
RDW: 12.9 % (ref 11.5–15.5)
WBC: 3.2 K/uL — ABNORMAL LOW (ref 4.0–10.5)
nRBC: 0 % (ref 0.0–0.2)

## 2023-09-26 LAB — TSH: TSH: 5.97 u[IU]/mL — ABNORMAL HIGH (ref 0.350–4.500)

## 2023-09-26 LAB — VITAMIN B12: Vitamin B-12: 1037 pg/mL — ABNORMAL HIGH (ref 180–914)

## 2023-09-26 NOTE — Progress Notes (Signed)
 [facility] CONSULT NOTE  Patient Care Team: [name], MD as PCP - General (Family Medicine)  CHIEF COMPLAINTS/PURPOSE OF CONSULTATION:  Pancytopenia.  ASSESSMENT & PLAN:   Assessment and Plan Assessment & Plan Pancytopenia, WBC lower normal, Hb improved, platelets low normal Once again, we have discussed about continued surveillance vs considering BMB if the counts were to significantly deteriorate He leans towards surveillance, since he is clinically asymptomatic.  Anemia due to Vitamin B12 deficiency B12 injections improved hemoglobin levels. White blood cell count slightly low but safe. High B12 levels likely from recent injections. Current dosing may be excessive. - Transition B12 injections to once a month. - Monitor B12 levels and adjust dosing as necessary. - Schedule follow-up appointment in early December.  RTC in Dec with labs.   HISTORY OF PRESENTING ILLNESS:  Kenneth Lawson 78 y.o. male is here because of Pancytopenia.  History of Present Illness    Kenneth Lawson is a 78 year old male who presents for follow-up on B12 deficiency and anemia.   Discussed the use of AI scribe software for clinical note transcription with the patient, who gave verbal consent to proceed.  History of Present Illness  Kenneth Lawson is a 78 year old male who presents for a follow-up visit to discuss his recent health improvements and travel plans.  He reports overall improvements in strength and energy levels. His appetite remains good, although he wants to eat less during the hot weather. He continues regular activities such as walking the dog and doing household chores, but the duration of his walks has decreased due to the heat.  He has been traveling recently, including trips to the beach and [place]  to visit family. He is planning a trip to [place] in October for a college reunion at the [employer], where he studied during a junior year  abroad.  He has been receiving B12 injections, which he believes have contributed to his improved condition. He has received these injections about four to five times. He has experienced some difficulty obtaining the appropriate needles from the pharmacy.  Recent blood work showed a slight decrease in white blood cell count, an increase in hemoglobin by almost a gram, and steady platelet levels. His previous B12 levels from May 29 were before he started the injections.  Rest of the pertinent 10 point ROS reviewed and neg.   MEDICAL HISTORY:  Past Medical History:  Diagnosis Date   Arthritis    Diabetes mellitus without complication (HCC)    Hypercholesterolemia    Hypertension     SURGICAL HISTORY: Past Surgical History:  Procedure Laterality Date   LAPARASCOPIC MEDICAN ARCUATE LIGAMENT RELEASE     MINOR HEMORRHOIDECTOMY N/A     SOCIAL HISTORY: Social History   Socioeconomic History   Marital status: Married    Spouse name: Not on file   Number of children: Not on file   Years of education: Not on file   Highest education level: Not on file  Occupational History   Not on file  Tobacco Use   Smoking status: Former    Types: Cigarettes   Smokeless tobacco: Never  Vaping Use   Vaping status: Not on file  Substance and Sexual Activity   Alcohol use: Yes    Comment: rarely   Drug use: Never   Sexual activity: Not on file  Other Topics Concern   Not on file  Social History Narrative   Pt lives with wife  Retired    Chief Executive Officer Drivers of Corporate investment banker Strain: Not on [employer] Insecurity: Not on file  Transportation Needs: Not on file  Physical Activity: Low Risk  (07/18/2023)   Received from [facility]   PCARE Exercise SDOH    Exercise: Aerobic    PCare Exercise SDOH: Not on file    PCare Exercise SDOH: Not on file  Stress: Not on file  Social Connections: Not on file  Intimate Partner Violence: Not on file    FAMILY  HISTORY: Family History  Problem Relation Age of Onset   Alzheimer's disease Mother    Cancer Neg Hx    Parkinson's disease Neg Hx    Tremor Neg Hx     ALLERGIES:  is allergic to metformin.  MEDICATIONS:  Current Outpatient Medications  Medication Sig Dispense Refill   ACCU-CHEK GUIDE test strip USE TO CHECK BLOOD SUGARS THREE TIMES DAILY. 100 strip 2   Accu-Chek Softclix Lancets lancets 3 times daily As directed DXE11.65 200 each 3   alfuzosin (UROXATRAL) 10 MG 24 hr tablet Take 10 mg by mouth daily.     Alogliptin -Pioglitazone  25-30 MG TABS Take 1 tablet by mouth daily. 90 tablet 3   ALPRAZolam (XANAX) 0.5 MG tablet Take 0.5 mg by mouth every other day as needed. Take 1/2 tablet by mouth every other day as needed.     atorvastatin  (LIPITOR) 80 MG tablet TAKE 1 TABLET DAILY 90 tablet 0   buPROPion (WELLBUTRIN XL) 300 MG 24 hr tablet Indications: anxiousness associated with depression. Take 1 tablet by mouth every day     busPIRone (BUSPAR) 10 MG tablet Take 5-10 mg by mouth 2 (two) times daily as needed.     cyanocobalamin  (VITAMIN B12) 1000 MCG/ML injection Inject 1 mL (1,000 mcg total) into the muscle once a week. 1 mL 7   Eluxadoline (VIBERZI) 75 MG TABS Take by mouth 2 (two) times daily.     finasteride (PROSCAR) 5 MG tablet      gabapentin (NEURONTIN) 300 MG capsule Take by mouth.     glimepiride  (AMARYL ) 4 MG tablet TAKE 1 TABLET AT BREAKFAST AND TAKE 1/2  AT LUNCH IF EATING A MEAL, OTHERWISE SKIP 180 tablet 0   mometasone (NASONEX) 50 MCG/ACT nasal spray USE 2 SPRAYS IN EACH NOSTRIL NASALLY ONCE A DAY AS NEEDED  5   NEEDLE, DISP, 23 G (BD DISP NEEDLE) 23G X 1 MISC Brand substitution OK 8 each 0   Semaglutide  (RYBELSUS ) 7 MG TABS TAKE 1 TABLET DAILY BEFORE BREAKFAST WITH HALF GLASS  OF WATER. 90 tablet 1   tadalafil (CIALIS) 5 MG tablet Take 5 mg by mouth daily as needed for erectile dysfunction.     traMADol (ULTRAM) 50 MG tablet Take 1 tablet 3 times a day by oral route as  needed for 30 days.     Vilazodone HCl 20 MG TABS Indications: major depressive disorder. Take 1 tablet by mouth every day     No current facility-administered medications for this visit.     PHYSICAL EXAMINATION: ECOG PERFORMANCE STATUS: 0 - Asymptomatic  Vitals:   09/26/23 0900  BP: (!) 164/63  Pulse: 68  Resp: 20  Temp: 98.1 F (36.7 C)  SpO2: 99%     Filed Weights   09/26/23 0900  Weight: 169 lb 3.2 oz (76.7 kg)      GENERAL:alert, no distress and comfortable SKIN: skin color, texture, turgor are normal, no rashes or significant  lesions EYES: normal, conjunctiva are pink and non-injected, sclera clear OROPHARYNX:no exudate, no erythema and lips, buccal mucosa, and tongue normal  NECK: supple, thyroid  normal size, non-tender, without nodularity LYMPH:  no palpable lymphadenopathy in the cervical, axillary LUNGS: clear to auscultation and percussion with normal breathing effort HEART: regular rate & rhythm and no murmurs and no lower extremity edema ABDOMEN:abdomen soft, non-tender and normal bowel sounds Musculoskeletal:no cyanosis of digits and no clubbing  PSYCH: alert & oriented x 3 with fluent speech NEURO: no focal motor/sensory deficits  LABORATORY DATA:  I have reviewed the data as listed Lab Results  Component Value Date   WBC 3.2 (L) 09/26/2023   HGB 10.3 (L) 09/26/2023   HCT 30.9 (L) 09/26/2023   MCV 90.9 09/26/2023   PLT 123 (L) 09/26/2023     Chemistry      Component Value Date/Time   NA 140 04/09/2023 1557   NA 140 02/13/2019 1558   K 4.0 04/09/2023 1557   CL 107 04/09/2023 1557   CO2 29 04/09/2023 1557   BUN 21 04/09/2023 1557   BUN 19 02/13/2019 1558   CREATININE 1.34 (H) 04/09/2023 1557      Component Value Date/Time   CALCIUM  9.4 04/09/2023 1557   ALKPHOS 78 04/09/2023 1557   AST 14 (L) 04/09/2023 1557   ALT 15 04/09/2023 1557   BILITOT 0.5 04/09/2023 1557       RADIOGRAPHIC STUDIES: I have personally reviewed the  radiological images as listed and agreed with the findings in the report. No results found.   All questions were answered. The patient knows to call the clinic with any problems, questions or concerns. I spent 30 minutes in the care of this patient including H and P, review of records, counseling and coordination of care.     Amber Stalls, MD 09/26/2023 10:12 AM

## 2023-11-25 ENCOUNTER — Ambulatory Visit: Admitting: Endocrinology

## 2023-12-11 ENCOUNTER — Telehealth: Payer: Self-pay

## 2023-12-11 NOTE — Telephone Encounter (Signed)
 Pt's wife called and reports that pt has yellowing of his eyes and skin. She is concerned about Jaundice and asked if this could be related. He recently travelled and was out of the country for a few weeks. Advised low b12 can cause jaundice, and asked if there was ascities present. She reports his stomach does look a little swollen. She was advised to call Dr Leonel or go to ED for eval. She is agreeable and will let us  know if we can be of further assistance.

## 2023-12-13 ENCOUNTER — Other Ambulatory Visit: Payer: Self-pay

## 2023-12-13 ENCOUNTER — Inpatient Hospital Stay (HOSPITAL_BASED_OUTPATIENT_CLINIC_OR_DEPARTMENT_OTHER)
Admission: EM | Admit: 2023-12-13 | Discharge: 2023-12-17 | DRG: 683 | Disposition: A | Discharge status: Home / Self Care | Source: Ambulatory Visit | Attending: Family Medicine | Admitting: Family Medicine

## 2023-12-13 ENCOUNTER — Other Ambulatory Visit: Payer: Self-pay | Admitting: Endocrinology

## 2023-12-13 ENCOUNTER — Encounter (HOSPITAL_BASED_OUTPATIENT_CLINIC_OR_DEPARTMENT_OTHER): Payer: Self-pay

## 2023-12-13 DIAGNOSIS — R17 Unspecified jaundice: Secondary | ICD-10-CM | POA: Diagnosis present

## 2023-12-13 DIAGNOSIS — Z888 Allergy status to other drugs, medicaments and biological substances status: Secondary | ICD-10-CM

## 2023-12-13 DIAGNOSIS — R0902 Hypoxemia: Secondary | ICD-10-CM | POA: Diagnosis present

## 2023-12-13 DIAGNOSIS — E872 Acidosis, unspecified: Secondary | ICD-10-CM | POA: Diagnosis not present

## 2023-12-13 DIAGNOSIS — G8929 Other chronic pain: Secondary | ICD-10-CM | POA: Diagnosis present

## 2023-12-13 DIAGNOSIS — R531 Weakness: Secondary | ICD-10-CM | POA: Diagnosis present

## 2023-12-13 DIAGNOSIS — G250 Essential tremor: Secondary | ICD-10-CM | POA: Diagnosis present

## 2023-12-13 DIAGNOSIS — N179 Acute kidney failure, unspecified: Principal | ICD-10-CM | POA: Diagnosis present

## 2023-12-13 DIAGNOSIS — D696 Thrombocytopenia, unspecified: Secondary | ICD-10-CM | POA: Diagnosis present

## 2023-12-13 DIAGNOSIS — N1831 Chronic kidney disease, stage 3a: Secondary | ICD-10-CM | POA: Diagnosis present

## 2023-12-13 DIAGNOSIS — D649 Anemia, unspecified: Secondary | ICD-10-CM | POA: Diagnosis not present

## 2023-12-13 DIAGNOSIS — Z7984 Long term (current) use of oral hypoglycemic drugs: Secondary | ICD-10-CM

## 2023-12-13 DIAGNOSIS — N281 Cyst of kidney, acquired: Secondary | ICD-10-CM | POA: Diagnosis present

## 2023-12-13 DIAGNOSIS — F39 Unspecified mood [affective] disorder: Secondary | ICD-10-CM | POA: Diagnosis present

## 2023-12-13 DIAGNOSIS — N400 Enlarged prostate without lower urinary tract symptoms: Secondary | ICD-10-CM | POA: Diagnosis present

## 2023-12-13 DIAGNOSIS — E538 Deficiency of other specified B group vitamins: Secondary | ICD-10-CM | POA: Diagnosis present

## 2023-12-13 DIAGNOSIS — Z87891 Personal history of nicotine dependence: Secondary | ICD-10-CM

## 2023-12-13 DIAGNOSIS — E1140 Type 2 diabetes mellitus with diabetic neuropathy, unspecified: Secondary | ICD-10-CM | POA: Diagnosis present

## 2023-12-13 DIAGNOSIS — E7800 Pure hypercholesterolemia, unspecified: Secondary | ICD-10-CM | POA: Diagnosis present

## 2023-12-13 DIAGNOSIS — I129 Hypertensive chronic kidney disease with stage 1 through stage 4 chronic kidney disease, or unspecified chronic kidney disease: Secondary | ICD-10-CM | POA: Diagnosis present

## 2023-12-13 DIAGNOSIS — K580 Irritable bowel syndrome with diarrhea: Secondary | ICD-10-CM | POA: Diagnosis present

## 2023-12-13 DIAGNOSIS — E1122 Type 2 diabetes mellitus with diabetic chronic kidney disease: Secondary | ICD-10-CM | POA: Diagnosis present

## 2023-12-13 DIAGNOSIS — E119 Type 2 diabetes mellitus without complications: Secondary | ICD-10-CM

## 2023-12-13 DIAGNOSIS — E11649 Type 2 diabetes mellitus with hypoglycemia without coma: Secondary | ICD-10-CM | POA: Diagnosis present

## 2023-12-13 DIAGNOSIS — Z820 Family history of epilepsy and other diseases of the nervous system: Secondary | ICD-10-CM | POA: Diagnosis not present

## 2023-12-13 LAB — CBC WITH DIFFERENTIAL/PLATELET
Abs Immature Granulocytes: 0.04 K/uL (ref 0.00–0.07)
Basophils Absolute: 0 K/uL (ref 0.0–0.1)
Basophils Relative: 0 %
Eosinophils Absolute: 0 K/uL (ref 0.0–0.5)
Eosinophils Relative: 0 %
HCT: 20.1 % — ABNORMAL LOW (ref 39.0–52.0)
Hemoglobin: 6.3 g/dL — CL (ref 13.0–17.0)
Immature Granulocytes: 1 %
Lymphocytes Relative: 8 %
Lymphs Abs: 0.6 K/uL — ABNORMAL LOW (ref 0.7–4.0)
MCH: 29.2 pg (ref 26.0–34.0)
MCHC: 31.3 g/dL (ref 30.0–36.0)
MCV: 93.1 fL (ref 80.0–100.0)
Monocytes Absolute: 0.5 K/uL (ref 0.1–1.0)
Monocytes Relative: 8 %
Neutro Abs: 5.7 K/uL (ref 1.7–7.7)
Neutrophils Relative %: 83 %
Platelets: 137 K/uL — ABNORMAL LOW (ref 150–400)
RBC: 2.16 MIL/uL — ABNORMAL LOW (ref 4.22–5.81)
RDW: 14.4 % (ref 11.5–15.5)
WBC: 6.8 K/uL (ref 4.0–10.5)
nRBC: 0 % (ref 0.0–0.2)

## 2023-12-13 LAB — COMPREHENSIVE METABOLIC PANEL WITH GFR
ALT: 31 U/L (ref 0–44)
AST: 24 U/L (ref 15–41)
Albumin: 4 g/dL (ref 3.5–5.0)
Alkaline Phosphatase: 79 U/L (ref 38–126)
Anion gap: 12 (ref 5–15)
BUN: 74 mg/dL — ABNORMAL HIGH (ref 8–23)
CO2: 22 mmol/L (ref 22–32)
Calcium: 9 mg/dL (ref 8.9–10.3)
Chloride: 103 mmol/L (ref 98–111)
Creatinine, Ser: 6.76 mg/dL — ABNORMAL HIGH (ref 0.61–1.24)
GFR, Estimated: 8 mL/min — ABNORMAL LOW (ref >60)
Glucose, Bld: 155 mg/dL — ABNORMAL HIGH (ref 70–99)
Potassium: 4.3 mmol/L (ref 3.5–5.1)
Sodium: 137 mmol/L (ref 135–145)
Total Bilirubin: 0.8 mg/dL (ref 0.0–1.2)
Total Protein: 6.7 g/dL (ref 6.5–8.1)

## 2023-12-13 LAB — PROTIME-INR
INR: 1 (ref 0.8–1.2)
Prothrombin Time: 13.4 s (ref 11.4–15.2)

## 2023-12-13 LAB — MAGNESIUM: Magnesium: 1.9 mg/dL (ref 1.7–2.4)

## 2023-12-13 LAB — LIPASE, BLOOD: Lipase: 18 U/L (ref 11–51)

## 2023-12-13 LAB — OCCULT BLOOD X 1 CARD TO LAB, STOOL: Fecal Occult Bld: NEGATIVE

## 2023-12-13 MED ORDER — SODIUM CHLORIDE 0.9 % IV BOLUS
500.0000 mL | Freq: Once | INTRAVENOUS | Status: AC
  Administered 2023-12-13: 500 mL via INTRAVENOUS

## 2023-12-13 NOTE — ED Notes (Signed)
 Pt placed on oxygen [place] due to low oxygen saturation readings.

## 2023-12-13 NOTE — ED Triage Notes (Signed)
 Pt c/o very very low kidney function. Bloodwork taken this AM, was advised that GFR was 8.   Pt severely jaundiced at time of triage, hx pancytopenia/ anemia

## 2023-12-13 NOTE — ED Provider Notes (Signed)
 [name] Provider Note   CSN: [id number] Arrival date & time: 12/13/23  8258     Patient presents with: No chief complaint on file.   Kenneth Lawson is a 78 y.o. male.   Patient complains of abnormal lab work.  Patient was sent here by his primary care physician because his GFR was 8.  Patient's wife reports patient has recently appeared jaundiced.  She reports that they traveled to [place] and that she noticed patient's color change when they returned.  Patient is currently followed by oncology for anemia.  Patient has a past medical history of type 2 diabetes hypercholesterolemia prostate hypertrophy, pancytopenia.  Patient denies any fever or chills.  He denies any abdominal pain.  His wife reports he looks like he has abdominal swelling.  Patient has not had any fever or chills he has not had any cough or congestion.  Patient denies any nausea or vomiting he denies any diarrhea.  Patient has not had any black or tarry stools.  The history is provided by the patient. No language interpreter was used.       Prior to Admission medications   Medication Sig Start Date End Date Taking? Authorizing Provider  ACCU-CHEK GUIDE test strip USE TO CHECK BLOOD SUGARS THREE TIMES DAILY. 10/17/20   Von Pacific, MD  Accu-Chek Softclix Lancets lancets 3 times daily As directed DXE11.65 04/11/21   Shamleffer, Ibtehal Jaralla, MD  alfuzosin (UROXATRAL) 10 MG 24 hr tablet Take 10 mg by mouth daily.    [provider]  Alogliptin -Pioglitazone  25-30 MG TABS Take 1 tablet by mouth daily. 04/26/23   Thapa, [place], MD  ALPRAZolam (XANAX) 0.5 MG tablet Take 0.5 mg by mouth every other day as needed. Take 1/2 tablet by mouth every other day as needed. 10/20/13   [provider]  atorvastatin  (LIPITOR) 80 MG tablet TAKE 1 TABLET DAILY 09/23/23   Thapa, [place], MD  buPROPion (WELLBUTRIN XL) 300 MG 24 hr tablet Indications: anxiousness associated with  depression. Take 1 tablet by mouth every day 03/31/20   [provider]  busPIRone (BUSPAR) 10 MG tablet Take 5-10 mg by mouth 2 (two) times daily as needed. 07/24/22   [provider]  cyanocobalamin  (VITAMIN B12) 1000 MCG/ML injection Inject 1 mL (1,000 mcg total) into the muscle once a week. 07/26/23   Iruku, Praveena, MD  Eluxadoline (VIBERZI) 75 MG TABS Take by mouth 2 (two) times daily.    [provider]  finasteride (PROSCAR) 5 MG tablet  01/20/14   [provider]  gabapentin (NEURONTIN) 300 MG capsule Take by mouth.    [provider]  glimepiride  (AMARYL ) 4 MG tablet TAKE 1 TABLET AT BREAKFAST AND TAKE 1/2  AT LUNCH IF EATING A MEAL, OTHERWISE SKIP 04/26/23   Thapa, [place], MD  mometasone (NASONEX) 50 MCG/ACT nasal spray USE 2 SPRAYS IN EACH NOSTRIL NASALLY ONCE A DAY AS NEEDED 09/11/14   [provider]  NEEDLE, DISP, 23 G (BD DISP NEEDLE) 23G X 1 MISC Brand substitution OK 08/16/23   Iruku, Praveena, MD  Semaglutide  (RYBELSUS ) 7 MG TABS TAKE 1 TABLET DAILY BEFORE BREAKFAST WITH HALF GLASS  OF WATER. 06/24/23   Thapa, [place], MD  tadalafil (CIALIS) 5 MG tablet Take 5 mg by mouth daily as needed for erectile dysfunction.    [provider]  traMADol (ULTRAM) 50 MG tablet Take 1 tablet 3 times a day by oral route as needed for 30 days. 07/23/22  [provider]  Vilazodone HCl 20 MG TABS Indications: major depressive disorder. Take 1 tablet by mouth every day    [provider]    Allergies: Metformin    Review of Systems  All other systems reviewed and are negative.   Updated Vital Signs BP (!) 168/66   Pulse 87   Temp 97.6 F (36.4 C)   Resp 17   SpO2 97%   Physical Exam Vitals and nursing note reviewed.  Constitutional:      Appearance: He is well-developed. He is ill-appearing.  HENT:     Head: Normocephalic.     Right Ear: Tympanic membrane normal.     Left Ear: Tympanic membrane normal.      Nose: Nose normal.     Mouth/Throat:     Mouth: Mucous membranes are moist.  Cardiovascular:     Rate and Rhythm: Normal rate.  Pulmonary:     Effort: Pulmonary effort is normal.  Abdominal:     General: There is no distension.  Musculoskeletal:        General: Normal range of motion.     Cervical back: Normal range of motion.  Skin:    General: Skin is warm.  Neurological:     General: No focal deficit present.     Mental Status: He is alert and oriented to person, place, and time. Mental status is at baseline.  Psychiatric:        Mood and Affect: Mood normal.     (all labs ordered are listed, but only abnormal results are displayed) Labs Reviewed  CBC WITH DIFFERENTIAL/PLATELET - Abnormal; Notable for the following components:      Result Value   RBC 2.16 (*)    Hemoglobin 6.3 (*)    HCT 20.1 (*)    Platelets 137 (*)    Lymphs Abs 0.6 (*)    All other components within normal limits  COMPREHENSIVE METABOLIC PANEL WITH GFR - Abnormal; Notable for the following components:   Glucose, Bld 155 (*)    BUN 74 (*)    Creatinine, Ser 6.76 (*)    GFR, Estimated 8 (*)    All other components within normal limits  LIPASE, BLOOD  PROTIME-INR  MAGNESIUM    EKG: None  Radiology: No results found.   Procedures   Medications Ordered in the ED - No data to display                                  Medical Decision Making Pt complains of weakness.  Pt's wife reports pt's Physician advised him to come in due to abnormal labs.  Pt's GFR was 8.  Wife reports pt's color has been pale.    Amount and/or Complexity of Data Reviewed Independent Historian: spouse    Details: Pt's wife reports pt has a GFR of 8.  Pt's doctor advised pt to come here for admission  Labs: ordered. Decision-making details documented in ED Course.    Details: Labs ordered reviewed and interpreted.  Hemoglobin is 6.3. bun is 74, Creatine is 6.76.  Hemocult stool is negative  Discussion of management  or test interpretation with external provider(s): I spoke with Hospitalist who will admit.   Risk Decision regarding hospitalization. Risk Details: Pt is anemic, he will need blood transfusion. Pt has an acute kidney injury, IV fluids started.  Pt will be admitted for further evaluation.  Final diagnoses:  Anemia, unspecified type  Acute renal failure, unspecified acute renal failure type  Weakness    ED Discharge Orders     None          Flint Sonny MARLA DEVONNA 12/13/23 2327    Geraldene Hamilton, MD 12/15/23 1226

## 2023-12-13 NOTE — ED Notes (Signed)
 Pts SATS iwas 87%, Pt placed on 3lt . And probe changed. Pt now sitting at 97-98%

## 2023-12-14 ENCOUNTER — Inpatient Hospital Stay (HOSPITAL_COMMUNITY)

## 2023-12-14 ENCOUNTER — Encounter (HOSPITAL_COMMUNITY): Payer: Self-pay | Admitting: Internal Medicine

## 2023-12-14 DIAGNOSIS — N179 Acute kidney failure, unspecified: Secondary | ICD-10-CM | POA: Diagnosis not present

## 2023-12-14 DIAGNOSIS — E119 Type 2 diabetes mellitus without complications: Secondary | ICD-10-CM

## 2023-12-14 DIAGNOSIS — D696 Thrombocytopenia, unspecified: Secondary | ICD-10-CM

## 2023-12-14 DIAGNOSIS — R0902 Hypoxemia: Secondary | ICD-10-CM

## 2023-12-14 LAB — RETICULOCYTES
Immature Retic Fract: 16.4 % — ABNORMAL HIGH (ref 2.3–15.9)
RBC.: 2.08 MIL/uL — ABNORMAL LOW (ref 4.22–5.81)
Retic Count, Absolute: 47.4 K/uL (ref 19.0–186.0)
Retic Ct Pct: 2.3 % (ref 0.4–3.1)

## 2023-12-14 LAB — CBC
HCT: 19.9 % — ABNORMAL LOW (ref 39.0–52.0)
HCT: 23.5 % — ABNORMAL LOW (ref 39.0–52.0)
Hemoglobin: 6.2 g/dL — CL (ref 13.0–17.0)
Hemoglobin: 7.5 g/dL — ABNORMAL LOW (ref 13.0–17.0)
MCH: 29.4 pg (ref 26.0–34.0)
MCH: 29.2 pg (ref 26.0–34.0)
MCHC: 31.2 g/dL (ref 30.0–36.0)
MCHC: 31.9 g/dL (ref 30.0–36.0)
MCV: 94.3 fL (ref 80.0–100.0)
MCV: 91.4 fL (ref 80.0–100.0)
Platelets: 145 K/uL — ABNORMAL LOW (ref 150–400)
Platelets: 128 K/uL — ABNORMAL LOW (ref 150–400)
RBC: 2.11 MIL/uL — ABNORMAL LOW (ref 4.22–5.81)
RBC: 2.57 MIL/uL — ABNORMAL LOW (ref 4.22–5.81)
RDW: 14.7 % (ref 11.5–15.5)
RDW: 16.2 % — ABNORMAL HIGH (ref 11.5–15.5)
WBC: 6.4 K/uL (ref 4.0–10.5)
WBC: 7.6 K/uL (ref 4.0–10.5)
nRBC: 0 % (ref 0.0–0.2)
nRBC: 0 % (ref 0.0–0.2)

## 2023-12-14 LAB — URINALYSIS, ROUTINE W REFLEX MICROSCOPIC
Bacteria, UA: NONE SEEN
Bilirubin Urine: NEGATIVE
Glucose, UA: NEGATIVE mg/dL
Hgb urine dipstick: NEGATIVE
Ketones, ur: NEGATIVE mg/dL
Leukocytes,Ua: NEGATIVE
Nitrite: POSITIVE — AB
Protein, ur: NEGATIVE mg/dL
Specific Gravity, Urine: 1.009 (ref 1.005–1.030)
pH: 5 (ref 5.0–8.0)

## 2023-12-14 LAB — LACTATE DEHYDROGENASE: LDH: 237 U/L — ABNORMAL HIGH (ref 98–192)

## 2023-12-14 LAB — BASIC METABOLIC PANEL WITH GFR
Anion gap: 11 (ref 5–15)
BUN: 72 mg/dL — ABNORMAL HIGH (ref 8–23)
CO2: 21 mmol/L — ABNORMAL LOW (ref 22–32)
Calcium: 8.4 mg/dL — ABNORMAL LOW (ref 8.9–10.3)
Chloride: 108 mmol/L (ref 98–111)
Creatinine, Ser: 6.25 mg/dL — ABNORMAL HIGH (ref 0.61–1.24)
GFR, Estimated: 9 mL/min — ABNORMAL LOW (ref >60)
Glucose, Bld: 129 mg/dL — ABNORMAL HIGH (ref 70–99)
Potassium: 4.1 mmol/L (ref 3.5–5.1)
Sodium: 140 mmol/L (ref 135–145)

## 2023-12-14 LAB — GLUCOSE, CAPILLARY
Glucose-Capillary: 100 mg/dL — ABNORMAL HIGH (ref 70–99)
Glucose-Capillary: 69 mg/dL — ABNORMAL LOW (ref 70–99)
Glucose-Capillary: 124 mg/dL — ABNORMAL HIGH (ref 70–99)
Glucose-Capillary: 107 mg/dL — ABNORMAL HIGH (ref 70–99)
Glucose-Capillary: 167 mg/dL — ABNORMAL HIGH (ref 70–99)

## 2023-12-14 LAB — TECHNOLOGIST SMEAR REVIEW
Clinical Information: BORDERLINE
Plt Morphology: NORMAL

## 2023-12-14 LAB — DIFFERENTIAL
Abs Immature Granulocytes: 0.05 K/uL (ref 0.00–0.07)
Basophils Absolute: 0 K/uL (ref 0.0–0.1)
Basophils Relative: 0 %
Eosinophils Absolute: 0.1 K/uL (ref 0.0–0.5)
Eosinophils Relative: 1 %
Immature Granulocytes: 1 %
Lymphocytes Relative: 6 %
Lymphs Abs: 0.5 K/uL — ABNORMAL LOW (ref 0.7–4.0)
Monocytes Absolute: 0.5 K/uL (ref 0.1–1.0)
Monocytes Relative: 7 %
Neutro Abs: 6.5 K/uL (ref 1.7–7.7)
Neutrophils Relative %: 85 %

## 2023-12-14 LAB — SODIUM, URINE, RANDOM: Sodium, Ur: 50 mmol/L

## 2023-12-14 LAB — TSH: TSH: 2.752 u[IU]/mL (ref 0.350–4.500)

## 2023-12-14 LAB — PROTEIN / CREATININE RATIO, URINE
Creatinine, Urine: 76 mg/dL
Protein Creatinine Ratio: 0.2 mg/mg{creat} — ABNORMAL HIGH (ref 0.00–0.15)
Total Protein, Urine: 15 mg/dL

## 2023-12-14 LAB — HEPATITIS PANEL, ACUTE
HCV Ab: NONREACTIVE
Hep A IgM: NONREACTIVE
Hep B C IgM: NONREACTIVE
Hepatitis B Surface Ag: NONREACTIVE

## 2023-12-14 LAB — HEMOGLOBIN A1C
Hgb A1c MFr Bld: 4.8 % (ref 4.8–5.6)
Mean Plasma Glucose: 91.06 mg/dL

## 2023-12-14 LAB — CREATININE, URINE, RANDOM: Creatinine, Urine: 74 mg/dL

## 2023-12-14 LAB — FERRITIN: Ferritin: 391 ng/mL — ABNORMAL HIGH (ref 24–336)

## 2023-12-14 LAB — CK: Total CK: 580 U/L — ABNORMAL HIGH (ref 49–397)

## 2023-12-14 LAB — IRON AND TIBC
Iron: 48 ug/dL (ref 45–182)
Saturation Ratios: 18 % (ref 17.9–39.5)
TIBC: 267 ug/dL (ref 250–450)
UIBC: 219 ug/dL

## 2023-12-14 LAB — ABO/RH: ABO/RH(D): A NEG

## 2023-12-14 LAB — PREPARE RBC (CROSSMATCH)

## 2023-12-14 LAB — VITAMIN B12: Vitamin B-12: 413 pg/mL (ref 180–914)

## 2023-12-14 LAB — HEMOGLOBIN AND HEMATOCRIT, BLOOD
HCT: 21.1 % — ABNORMAL LOW (ref 39.0–52.0)
Hemoglobin: 6.7 g/dL — CL (ref 13.0–17.0)

## 2023-12-14 LAB — FOLATE: Folate: 8.3 ng/mL (ref >5.9)

## 2023-12-14 MED ORDER — SODIUM CHLORIDE 0.9 % IV BOLUS
2500.0000 mL | Freq: Once | INTRAVENOUS | Status: AC
Start: 2023-12-14 — End: 2023-12-15
  Administered 2023-12-14: 2500 mL via INTRAVENOUS

## 2023-12-14 MED ORDER — ELUXADOLINE 75 MG PO TABS
ORAL_TABLET | Freq: Two times a day (BID) | ORAL | Status: DC
  Filled 2023-12-14 (×22): qty 1

## 2023-12-14 MED ORDER — INSULIN ASPART 100 UNIT/ML IJ SOLN: 0.0000 [IU] | Freq: Every day | INTRAMUSCULAR | Status: DC

## 2023-12-14 MED ORDER — ALFUZOSIN HCL ER 10 MG PO TB24
10.0000 mg | ORAL_TABLET | Freq: Every day | ORAL | Status: DC
  Administered 2023-12-14 – 2023-12-16 (×3): 10 mg via ORAL
  Filled 2023-12-14 (×4): qty 1

## 2023-12-14 MED ORDER — FINASTERIDE 5 MG PO TABS
5.0000 mg | ORAL_TABLET | Freq: Every day | ORAL | Status: DC
  Administered 2023-12-14 – 2023-12-17 (×4): 5 mg via ORAL
  Filled 2023-12-14 (×4): qty 1

## 2023-12-14 MED ORDER — ATORVASTATIN CALCIUM 40 MG PO TABS
40.0000 mg | ORAL_TABLET | Freq: Every day | ORAL | Status: DC
Start: 2023-12-14 — End: 2023-12-16
  Administered 2023-12-14 – 2023-12-16 (×3): 40 mg via ORAL
  Filled 2023-12-14 (×3): qty 1

## 2023-12-14 MED ORDER — VILAZODONE HCL 20 MG PO TABS
20.0000 mg | ORAL_TABLET | Freq: Every day | ORAL | Status: DC
  Administered 2023-12-14 – 2023-12-16 (×3): 20 mg via ORAL
  Filled 2023-12-14 (×6): qty 1

## 2023-12-14 MED ORDER — BUSPIRONE HCL 5 MG PO TABS
10.0000 mg | ORAL_TABLET | Freq: Two times a day (BID) | ORAL | Status: DC
  Administered 2023-12-14 – 2023-12-17 (×6): 10 mg via ORAL
  Filled 2023-12-14 (×7): qty 2

## 2023-12-14 MED ORDER — LABETALOL HCL 5 MG/ML IV SOLN: 5.0000 mg | INTRAVENOUS | Status: DC | PRN

## 2023-12-14 MED ORDER — ACETAMINOPHEN 325 MG PO TABS
650.0000 mg | ORAL_TABLET | Freq: Four times a day (QID) | ORAL | Status: DC | PRN
  Administered 2023-12-16: 650 mg via ORAL
  Filled 2023-12-14 (×2): qty 2

## 2023-12-14 MED ORDER — SODIUM CHLORIDE 0.9% IV SOLUTION
Freq: Once | INTRAVENOUS | Status: AC
Start: 2023-12-14 — End: 2023-12-16

## 2023-12-14 MED ORDER — ACETAMINOPHEN 650 MG RE SUPP: 650.0000 mg | Freq: Four times a day (QID) | RECTAL | Status: DC | PRN

## 2023-12-14 MED ORDER — SODIUM CHLORIDE 0.9 % IV SOLN: INTRAVENOUS | Status: DC

## 2023-12-14 MED ORDER — BUPROPION HCL ER (XL) 150 MG PO TB24
300.0000 mg | ORAL_TABLET | Freq: Every day | ORAL | Status: DC
  Administered 2023-12-14 – 2023-12-17 (×4): 300 mg via ORAL
  Filled 2023-12-14 (×4): qty 2

## 2023-12-14 MED ORDER — GABAPENTIN 300 MG PO CAPS: 300.0000 mg | ORAL_CAPSULE | Freq: Three times a day (TID) | ORAL | Status: DC

## 2023-12-14 MED ORDER — STERILE WATER FOR INJECTION IV SOLN
INTRAVENOUS | Status: DC
  Filled 2023-12-14 (×3): qty 1000

## 2023-12-14 MED ORDER — INSULIN ASPART 100 UNIT/ML IJ SOLN: 0.0000 [IU] | Freq: Three times a day (TID) | INTRAMUSCULAR | Status: DC

## 2023-12-14 MED ORDER — SODIUM CHLORIDE 0.9% IV SOLUTION: Freq: Once | INTRAVENOUS | Status: AC

## 2023-12-14 NOTE — Progress Notes (Signed)
 Pt off unit, went to have an US .

## 2023-12-14 NOTE — Progress Notes (Deleted)
 Paged Dr. Paola at [phone] regarding critical PTT. Now this RN called Pharmacy and was told that [facility] is working on the orders regarding the PTT of 187.

## 2023-12-14 NOTE — Hospital Course (Addendum)
 78 y.o. M with DM, BPH, mood disorder, HTN and anemia who presents with pallor, fatigue insidious onset over few weeks while traveling in [place], found to have Hgb 7 g/dL and Cr 6.2 mg/dL.  Admitted for further work up.    Assessment and plan: Renal failure Previous baseline 1.3 within the last few months.  FENa 3%.  PC ratio low, UA acellular.  Renal US  rules out hydronephrosis.  No NSAIDs, no real nephrotoxins in his med list.  Not on PPI.  Ddx includes MM, dehydration superimposed on undetected CKD.  TMA very unlikely but smear will detect. - Blood smear - Repeat SPEP, FLCs - Strict I/Os - Nephrology consultation   Anemia He had been evaluated by Hematology 9 months ago for progressive mild pancytopenia (WBC 3.1, Hgb 9, Plts 125), but folate, SPEP, flow cytometry, and ANA were normal.  His white count and platelets at the time of hematology evaluation had resolved by themselves, B12 was slightly low, TSH slightly high, and kappa free light chains was only minimally elevated, so he was started on B12 supplementation and BMB was not pursued.  Over the next 6 months, he was asymptomatic, had some slight improvement in all indices, and so BMB was deferred in favor of continued surveillance.  Hgb here 6.2 g/dL on presentation.  Reticulocytes inappropriately low.  RI 0.5.  FOBT negative, no report of GI bleeding.  Tbili normal, doubt hemolysis.  Platelets close to normal.  Iron replete, B12 replete.  Smear shows normal cells.  No schistocytes.  Hepatitis serologies negative.  SPEP pending. - Check smear, haptoglobin - Repeat SPEP and FLCs - Transfuse 2u PRBCs - Hgb transfusion threshold 7 g/dL - Close monitoring Hgb    Hypoxemia It appears this was spurious  Type 2 diabetes Glucoses controlled - Hold glimepiride , semaglutide , alogliptin /pioglitazone  - Continue Lipitor - Continue sliding scale corrections  Mood disorder - Continue bupropion, BuSpar, for [name]  IBS - Continue  home Viberzi  BPH - Continue finasteride, alfuzosin

## 2023-12-14 NOTE — Progress Notes (Signed)
 Pt returned to the unit, restarted fluids and ensured O2 was at 4L. Call bell in reach all needs met at this time.

## 2023-12-14 NOTE — Consult Note (Signed)
 Renal Service Consult Note [facility] Lamar JONETTA Fret, MD  Patient: Kenneth Lawson Date: 12/14/2023 Requesting Physician: Dr. Jonel  Reason for Consult: Renal failure  HPI: The patient is a 78 y.o. year-old w/ PMH as below who presented to ED sent for poor kidney function on labs taken 10/17.  Pt has hx of DM2, HTN, HL, BPH, pancytopenia and anemia/ B12 deficiency. Also has sig back pain issues. Recently pt and his wife flew to [place] for a trip and upon returning noted his gen'd weakness, and a pale color. No SOB, CP, abd pain. Some IBS symptoms. No bleeding. In ED BP 160/85, HR 87, RR 16, temp 98. Na 140, K+ 4.1, CO2 21, BUN 72 and creat 6.25.  Ca 8.4, alb 4.0, AST/ ALT wnl, tbili 0.8, CPK 580, LDH 237, tsat 18%, WBC 6K, Hb 6.3, Plt 137k. INR 1.0. UA neg protein, 0-5 rbc/ wbc/ epis. UP/C ratio is 0.20. UNa 50, UCr 76. CXR showed LLL atx, no other acute findings. Renal US  showed no hydronephrosis and no stones.    Pt seen in room. Pt's wife states that he has had anemia problems starting in Feb 2025 and they are seeing a hematologist. He has B12 deficiency, has not had a BM bx yet. Pt denies any n/v, fatigue or tremors/ shaking. Good appetite. Doesn't drink a lot of fluids per the wife. He has some IBS-D. He has a lot of back pain issues.    ROS - denies CP, no joint pain, no HA, no blurry vision, no rash, no diarrhea, no nausea/ vomiting   Past Medical History  Past Medical History:  Diagnosis Date   Arthritis    Diabetes mellitus without complication (HCC)    Hypercholesterolemia    Hypertension    Past Surgical History  Past Surgical History:  Procedure Laterality Date   LAPARASCOPIC MEDICAN ARCUATE LIGAMENT RELEASE     MINOR HEMORRHOIDECTOMY N/A    Family History  Family History  Problem Relation Age of Onset   Alzheimer's disease Mother    Cancer Neg Hx    Parkinson's disease Neg Hx    Tremor Neg Hx    Social History  reports that he has quit  smoking. His smoking use included cigarettes. He has never used smokeless tobacco. He reports current alcohol use. He reports that he does not use drugs. Allergies  Allergies  Allergen Reactions   Metformin Diarrhea and Other (See Comments)   Home medications Prior to Admission medications   Medication Sig Start Date End Date Taking? Authorizing Provider  ACCU-CHEK GUIDE test strip USE TO CHECK BLOOD SUGARS THREE TIMES DAILY. 10/17/20   Von Pacific, MD  Accu-Chek Softclix Lancets lancets 3 times daily As directed DXE11.65 04/11/21   Shamleffer, Ibtehal Jaralla, MD  alfuzosin (UROXATRAL) 10 MG 24 hr tablet Take 10 mg by mouth daily.    [provider]  Alogliptin -Pioglitazone  25-30 MG TABS Take 1 tablet by mouth daily. 04/26/23   Thapa, [place], MD  ALPRAZolam (XANAX) 0.5 MG tablet Take 0.5 mg by mouth every other day as needed. Take 1/2 tablet by mouth every other day as needed. 10/20/13   [provider]  atorvastatin  (LIPITOR) 80 MG tablet TAKE 1 TABLET DAILY 09/23/23   Thapa, [place], MD  buPROPion (WELLBUTRIN XL) 300 MG 24 hr tablet Indications: anxiousness associated with depression. Take 1 tablet by mouth every day 03/31/20   [provider]  busPIRone (BUSPAR) 10 MG tablet Take 5-10 mg by mouth 2 (two)  times daily as needed. 07/24/22   [provider]  cyanocobalamin  (VITAMIN B12) 1000 MCG/ML injection Inject 1 mL (1,000 mcg total) into the muscle once a week. 07/26/23   Iruku, Praveena, MD  Eluxadoline (VIBERZI) 75 MG TABS Take by mouth 2 (two) times daily.    [provider]  finasteride (PROSCAR) 5 MG tablet  01/20/14   [provider]  gabapentin (NEURONTIN) 300 MG capsule Take by mouth.    [provider]  glimepiride  (AMARYL ) 4 MG tablet TAKE 1 TABLET AT BREAKFAST AND TAKE 1/2  AT LUNCH IF EATING A MEAL, OTHERWISE SKIP 04/26/23   Thapa, [place], MD  mometasone (NASONEX) 50 MCG/ACT nasal spray USE 2 SPRAYS IN EACH NOSTRIL NASALLY ONCE A  DAY AS NEEDED 09/11/14   [provider]  NEEDLE, DISP, 23 G (BD DISP NEEDLE) 23G X 1 MISC Brand substitution OK 08/16/23   Iruku, Praveena, MD  Semaglutide  (RYBELSUS ) 7 MG TABS TAKE 1 TABLET DAILY BEFORE BREAKFAST WITH HALF GLASS  OF WATER. 06/24/23   Thapa, [place], MD  tadalafil (CIALIS) 5 MG tablet Take 5 mg by mouth daily as needed for erectile dysfunction.    [provider]  traMADol (ULTRAM) 50 MG tablet Take 1 tablet 3 times a day by oral route as needed for 30 days. 07/23/22   [provider]  Vilazodone HCl 20 MG TABS Indications: major depressive disorder. Take 1 tablet by mouth every day    [provider]     Vitals:   12/14/23 1332 12/14/23 1354 12/14/23 1704 12/14/23 1739  BP: 131/62 (!) 138/54 133/64 (!) 143/66  Pulse: 86 80 80 83  Resp: 16 16 16 16   Temp: 98.2 F (36.8 C) 98.3 F (36.8 C) 98.2 F (36.8 C) 98.1 F (36.7 C)  TempSrc: Oral Oral Oral Oral  SpO2: 93% 95% 93% 94%  Weight: 77.8 kg     Height: 5' 8 (1.727 m)      Exam Gen alert, no distress, pleasant elderly male Sclera anicteric, throat clear, slightly dry No jvd or bruits Chest clear bilat to bases RRR no MRG Abd soft ntnd no mass or ascites +bs, no bruit Ext no LE or UE edema, no other edema Neuro is alert, Ox 3 , nf, no asterixis      Home meds: Uroxatral, alogliptin -pioglitazone , xanax prn, lipitor, wellbutrin XL, buspar prn, vit B12, proscar, gabapentin, amaryl , semaglutide , tramadol, vilazodone  Date   Creat  eGFR (ml/min) 2014- October 2024 0.80- 1.08 Apr 2023  1.34  55 ml/min  10/17   6.76  8  12/14/23   6.25  9   Old labs: Feb 2025 free LC ratio --> kappa 23 mg per L/ lambda 14 mg per L, and ratio = 1.63 (wnl)  UA- prot neg, 0-5 rbc/ wbc/ epi Renal US  - no hydro, 11.4/ 11.9 cm kidneys CXR - LLL atx, no other acute findings UNa 50, UCreat 76 UP/C ratio: 0.20    Assessment/ Plan: AKI on CKD 3a: b/l creatinine is 1.3 from feb 2025, eGFR 55  ml/min. Creat here was 6.7 on admission, in the setting of severe anemia (acute on chronic), gen'd weakness, borderline thrombocytopenia. UA is negative, UP/C ratio is low and renal US  shows no obstruction. BP's are wnl, pt has DM2 but not on insulin, low A1c 4.8- 5.8. Pt may be slightly vol depleted, but not severe. FLC ratio in Feb was wnl. Calcium  is normal, alb normal. Not taking nsaids or PPI's. Unclear cause  of renal failure. Bland UA w/ no proteinuria and no obstruction. DDx is AIN, poss ATN, myeloma kidney, TMA vs other. Not uremic at this time and does not require RRT. Will send off serologies, myeloma studies, haptoglobin and smear for review. Will give IVFs for 24-48 hrs to see if this helps. May need a biopsy. Will follow.  Chronic back pain B12 def Chronic anemia: f/b hematology in OP setting DM2: on oral meds and        Rob Geralynn  MD [facility] 12/14/2023, 6:23 PM  Recent Labs  Lab 12/13/23 2025 12/14/23 0547 12/14/23 0620 12/14/23 1146  HGB 6.3* 6.2*  --  6.7*  ALBUMIN 4.0  --   --   --   CALCIUM  9.0  --  8.4*  --   CREATININE 6.76*  --  6.25*  --   K 4.3  --  4.1  --    Inpatient medications:  sodium chloride   Intravenous Once   alfuzosin  10 mg Oral QHS   atorvastatin   40 mg Oral Daily   buPROPion  300 mg Oral Daily   busPIRone  10 mg Oral BID   Eluxadoline   Oral BID   finasteride  5 mg Oral Daily   insulin aspart  0-5 Units Subcutaneous QHS   insulin aspart  0-9 Units Subcutaneous TID WC   Vilazodone HCl  20 mg Oral QHS    acetaminophen  **OR** acetaminophen , labetalol

## 2023-12-14 NOTE — H&P (Signed)
 History and Physical    Kenneth Lawson FMW:[id number] DOB: [birth year] DOA: 12/13/2023  PCP: [name], MD  Patient coming from: [facility] ED  Chief Complaint: Abnormal outpatient labs  HPI: Kenneth Lawson is a 78 y.o. male with medical history significant of type 2 diabetes, hypertension, hyperlipidemia, anxiety/depression, BPH, pancytopenia, anemia due to B12 deficiency, cervical myelopathy, lumbar spinal stenosis, essential tremor presenting from the ED for evaluation of AKI and acute on chronic anemia.  Hypertensive with SBP up to 160s.  Oxygen saturation 87% on room air and placed on supplemental oxygen.  Afebrile.  Labs showing no leukocytosis, hemoglobin 6.3 (previously 10.3 on 09/26/2023), MCV 93.1, platelet count 137k (stable), glucose 155, BUN 74, creatinine 6.7 (was 1.3 in February 2025 and previously 0.9 in October 2024), normal LFTs, FOBT negative. Patient was given 500 mL normal saline in the ED.  History provided by the patient and his wife at bedside.  They recently went on a trip to [place] (8-9-hour direct flight) and soon after returning, wife noticed that the patient was appearing slightly jaundiced.  Patient is reporting generalized weakness but denies fevers, cough, shortness of breath, lightheadedness/dizziness, chest pain, nausea, vomiting, abdominal pain, or diarrhea.  No significant change in oral intake.  He also denies any symptoms of bleeding such as hematemesis, hematochezia, melena, or hematuria.  Patient is complaining of cramping in both of his legs which according to his wife is a chronic problem.  No leg swelling.  Patient denies history of previous blood clots.  Review of Systems:  Review of Systems  All other systems reviewed and are negative.   Past Medical History:  Diagnosis Date   Arthritis    Diabetes mellitus without complication (HCC)    Hypercholesterolemia    Hypertension     Past Surgical History:  Procedure Laterality Date   LAPARASCOPIC  MEDICAN ARCUATE LIGAMENT RELEASE     MINOR HEMORRHOIDECTOMY N/A      reports that he has quit smoking. His smoking use included cigarettes. He has never used smokeless tobacco. He reports current alcohol use. He reports that he does not use drugs.  Allergies  Allergen Reactions   Metformin Diarrhea and Other (See Comments)    Family History  Problem Relation Age of Onset   Alzheimer's disease Mother    Cancer Neg Hx    Parkinson's disease Neg Hx    Tremor Neg Hx     Prior to Admission medications   Medication Sig Start Date End Date Taking? Authorizing Provider  ACCU-CHEK GUIDE test strip USE TO CHECK BLOOD SUGARS THREE TIMES DAILY. 10/17/20   [place], MD  Accu-Chek Softclix Lancets lancets 3 times daily As directed DXE11.65 04/11/21   Shamleffer, Ibtehal Jaralla, MD  alfuzosin (UROXATRAL) 10 MG 24 hr tablet Take 10 mg by mouth daily.    [provider]  Alogliptin -Pioglitazone  25-30 MG TABS Take 1 tablet by mouth daily. 04/26/23   Thapa, [place], MD  ALPRAZolam (XANAX) 0.5 MG tablet Take 0.5 mg by mouth every other day as needed. Take 1/2 tablet by mouth every other day as needed. 10/20/13   [provider]  atorvastatin  (LIPITOR) 80 MG tablet TAKE 1 TABLET DAILY 09/23/23   Thapa, [place], MD  buPROPion (WELLBUTRIN XL) 300 MG 24 hr tablet Indications: anxiousness associated with depression. Take 1 tablet by mouth every day 03/31/20   [provider]  busPIRone (BUSPAR) 10 MG tablet Take 5-10 mg by mouth 2 (two) times daily as needed. 07/24/22   [provider]  cyanocobalamin  (VITAMIN B12) 1000 MCG/ML injection Inject 1 mL (1,000 mcg total) into the muscle once a week. 07/26/23   Iruku, Praveena, MD  Eluxadoline (VIBERZI) 75 MG TABS Take by mouth 2 (two) times daily.    [provider]  finasteride (PROSCAR) 5 MG tablet  01/20/14   [provider]  gabapentin (NEURONTIN) 300 MG capsule Take by mouth.    [provider]   glimepiride  (AMARYL ) 4 MG tablet TAKE 1 TABLET AT BREAKFAST AND TAKE 1/2  AT LUNCH IF EATING A MEAL, OTHERWISE SKIP 04/26/23   Thapa, [place], MD  mometasone (NASONEX) 50 MCG/ACT nasal spray USE 2 SPRAYS IN EACH NOSTRIL NASALLY ONCE A DAY AS NEEDED 09/11/14   [provider]  NEEDLE, DISP, 23 G (BD DISP NEEDLE) 23G X 1 MISC Brand substitution OK 08/16/23   Iruku, Praveena, MD  Semaglutide  (RYBELSUS ) 7 MG TABS TAKE 1 TABLET DAILY BEFORE BREAKFAST WITH HALF GLASS  OF WATER. 06/24/23   Thapa, [place], MD  tadalafil (CIALIS) 5 MG tablet Take 5 mg by mouth daily as needed for erectile dysfunction.    [provider]  traMADol (ULTRAM) 50 MG tablet Take 1 tablet 3 times a day by oral route as needed for 30 days. 07/23/22   [provider]  Vilazodone HCl 20 MG TABS Indications: major depressive disorder. Take 1 tablet by mouth every day    [provider]    Physical Exam: Vitals:   12/13/23 1900 12/13/23 2015 12/13/23 2201 12/13/23 2356  BP: (!) 149/66 (!) 168/66  (!) 160/72  Pulse: 86 87  94  Resp:  17  16  Temp:   98.3 F (36.8 C) 98.3 F (36.8 C)  TempSrc:   Oral Oral  SpO2: 90% 97%  94%    Physical Exam Vitals reviewed.  Constitutional:      General: He is not in acute distress. HENT:     Head: Normocephalic and atraumatic.  Eyes:     Extraocular Movements: Extraocular movements intact.  Cardiovascular:     Rate and Rhythm: Regular rhythm. Tachycardia present.     Comments: Mild tachycardia Pulmonary:     Effort: Pulmonary effort is normal. No respiratory distress.     Breath sounds: Normal breath sounds. No stridor. No wheezing, rhonchi or rales.  Abdominal:     General: Bowel sounds are normal. There is no distension.     Palpations: Abdomen is soft.     Tenderness: There is no abdominal tenderness. There is no guarding.  Musculoskeletal:     Cervical back: Normal range of motion.     Right lower leg: No edema.     Left lower leg: No edema.   Skin:    General: Skin is warm and dry.  Neurological:     General: No focal deficit present.     Mental Status: He is alert and oriented to person, place, and time.     Labs on Admission: I have personally reviewed following labs and imaging studies  CBC: Recent Labs  Lab 12/13/23 2025  WBC 6.8  NEUTROABS 5.7  HGB 6.3*  HCT 20.1*  MCV 93.1  PLT 137*   Basic Metabolic Panel: Recent Labs  Lab 12/13/23 2025  NA 137  K 4.3  CL 103  CO2 22  GLUCOSE 155*  BUN 74*  CREATININE 6.76*  CALCIUM  9.0  MG 1.9   GFR: CrCl cannot be calculated (Unknown ideal weight.). Liver Function Tests: Recent Labs  Lab  12/13/23 2025  AST 24  ALT 31  ALKPHOS 79  BILITOT 0.8  PROT 6.7  ALBUMIN 4.0   Recent Labs  Lab 12/13/23 2025  LIPASE 18   No results for input(s): AMMONIA in the last 168 hours. Coagulation Profile: Recent Labs  Lab 12/13/23 2025  INR 1.0   Cardiac Enzymes: No results for input(s): CKTOTAL, CKMB, CKMBINDEX, TROPONINI in the last 168 hours. BNP (last 3 results) No results for input(s): PROBNP in the last 8760 hours. HbA1C: No results for input(s): HGBA1C in the last 72 hours. CBG: No results for input(s): GLUCAP in the last 168 hours. Lipid Profile: No results for input(s): CHOL, HDL, LDLCALC, TRIG, CHOLHDL, LDLDIRECT in the last 72 hours. Thyroid  Function Tests: No results for input(s): TSH, T4TOTAL, FREET4, T3FREE, THYROIDAB in the last 72 hours. Anemia Panel: No results for input(s): VITAMINB12, FOLATE, FERRITIN, TIBC, IRON, RETICCTPCT in the last 72 hours. Urine analysis:    Component Value Date/Time   COLORURINE YELLOW 02/10/2022 1025   APPEARANCEUR CLEAR 02/10/2022 1025   LABSPEC 1.016 02/10/2022 1025   PHURINE 6.5 02/10/2022 1025   GLUCOSEU NEGATIVE 02/10/2022 1025   GLUCOSEU >=1000 (A) 04/03/2016 0923   HGBUR NEGATIVE 02/10/2022 1025   BILIRUBINUR NEGATIVE 02/10/2022 1025   BILIRUBINUR  small 04/22/2015 1003   KETONESUR NEGATIVE 02/10/2022 1025   PROTEINUR NEGATIVE 02/10/2022 1025   UROBILINOGEN 0.2 04/03/2016 0923   NITRITE NEGATIVE 02/10/2022 1025   LEUKOCYTESUR SMALL (A) 02/10/2022 1025    Radiological Exams on Admission: No results found.  Assessment and Plan  AKI BUN 74, creatinine 6.7 (was 1.3 in February 2025 and previously 0.9 in October 2024).  Continue IV fluid hydration and monitor renal function.  Avoid nephrotoxic agents.  UA and renal ultrasound ordered.  Consult nephrology in the morning.  Symptomatic acute on chronic normocytic anemia History of anemia due to B12 deficiency followed by hematology and receives monthly B12 injections.  Given AKI and anemia, multiple myeloma is on the differential, however, it seems patient had extensive outpatient workup done by hematology back in February 2025 which showed no evidence of autoimmune problems, multiple myeloma, or lymphoma on blood work.  Hemoglobin 6.3 (previously 10.3 on 09/26/2023).  Patient is not endorsing any symptoms of GI bleed and FOBT negative.  Anemia panel ordered.  Type and screen, 1 unit PRBCs ordered after obtaining consent from the patient.  Continue to monitor H&H and transfuse if hemoglobin less than 7.  Hypoxemia Oxygen saturation 87% on room air in the ED.  Currently satting well on 4 L [place].  Lungs clear on exam and no respiratory distress.  Continue supplemental oxygen.  Stat chest x-ray ordered.  If chest x-ray negative, then PE is on the differential given recent long distance flight to [place].  If chest x-ray negative, check D-dimer and if D-dimer positive then VQ scan needs to be done in the morning to rule out PE.  CT angiogram chest cannot be done due to AKI.  Mild thrombocytopenia Appears to be chronic and followed by outpatient hematology.  Platelet count currently stable, monitor labs.  Type 2 diabetes Hemoglobin A1c 5.7 in February 2025, repeat ordered.  Placed on sensitive sliding  scale insulin ACHS.  Hold oral hypoglycemic agents.  Hypertension SBP up to 160s.  Not on antihypertensive agents.  IV labetalol PRN SBP >160.  Hyperlipidemia Continue atorvastatin .  Mood disorder Continue bupropion, buspirone, and Vilazodone.  BPH Continue alfuzosin and finasteride.  IBS Continue eluxadoline.  Chronic pain/neuropathy Continue gabapentin.  DVT prophylaxis: Avoiding anticoagulation given worsening anemia requiring blood transfusion.  Avoiding SCDs until chest x-ray is done and if negative then plan is to check D-dimer as above. Code Status: Full Code (discussed with the patient) Family Communication: Wife at bedside. Level of care: Telemetry bed Admission status: It is my clinical opinion that admission to INPATIENT is reasonable and necessary because of the expectation that this patient will require hospital care that crosses at least 2 midnights to treat this condition based on the medical complexity of the problems presented.  Given the aforementioned information, the predictability of an adverse outcome is felt to be significant.  Editha Ram MD [facility]  If 7PM-7AM, please contact night-coverage [email or web address]  12/14/2023, 2:42 AM

## 2023-12-14 NOTE — Progress Notes (Signed)
 X-ray at bedside.

## 2023-12-14 NOTE — Progress Notes (Signed)
    Patient: Kenneth Lawson FMW:[id number] DOB: Nov 24, 1945      Brief hospital course: 78 y.o. M with DM, BPH, mood disorder, HTN and anemia who presents with pallor, fatigue insidious onset over few weeks while traveling in [place], found to have Hgb 7 g/dL and Cr 6.2 mg/dL.  Admitted for further work up.     Renal failure Previous baseline 1.3 within the last few months.  FENa 3%.  PC ratio low, UA acellular.  Renal US  rules out hydronephrosis.  No NSAIDs, no real nephrotoxins in his med list.  Not on PPI.  Ddx includes MM, dehydration superimposed on undetected CKD.  TMA very unlikely but smear will detect. - Blood smear - Repeat SPEP, FLCs - Strict I/Os - Nephrology consultation   Anemia He had been evaluated by Hematology 9 months ago for progressive mild pancytopenia (WBC 3.1, Hgb 9, Plts 125), but folate, SPEP, flow cytometry, and ANA were normal.  His white count and platelets at the time of hematology evaluation had resolved by themselves, B12 was slightly low, TSH slightly high, and kappa free light chains was only minimally elevated, so he was started on B12 supplementation and BMB was not pursued.  Over the next 6 months, he was asymptomatic, had some slight improvement in all indices, and so BMB was deferred in favor of continued surveillance.  Hgb here 6.2 g/dL on presentation.  Reticulocytes inappropriately low.  RI 0.5.  FOBT negative, no report of GI bleeding.  Tbili normal, doubt hemolysis.  Platelets close to normal.  Iron replete, B12 replete. - Check smear, haptoglobin - Repeat SPEP and FLCs - Transfuse 2u PRBCs - Hgb transfusion threshold 7 g/dL - Close monitoring Hgb   Hypoxemia This appears to be spurious  This is a no charge note, for further details, please see the H&P by my partner, Dr. Abigail from earlier today.   Principal Problem:   AKI (acute kidney injury) Active Problems:   Symptomatic anemia   Hypoxemia   Thrombocytopenia   Type 2 diabetes  mellitus (HCC)           Physical Exam: BP (!) 138/54   Pulse 80   Temp 98.3 F (36.8 C) (Oral)   Resp 16   Ht 5' 8 (1.727 m)   Wt 77.8 kg   SpO2 95%   BMI 26.08 kg/m   Patient seen and examined.      Family Communication: Wife        Author: [name], MD 12/14/2023 3:19 PM

## 2023-12-14 NOTE — Plan of Care (Signed)

## 2023-12-15 DIAGNOSIS — N179 Acute kidney failure, unspecified: Secondary | ICD-10-CM | POA: Diagnosis not present

## 2023-12-15 LAB — COMPREHENSIVE METABOLIC PANEL WITH GFR
ALT: 26 U/L (ref 0–44)
AST: 19 U/L (ref 15–41)
Albumin: 2.7 g/dL — ABNORMAL LOW (ref 3.5–5.0)
Alkaline Phosphatase: 48 U/L (ref 38–126)
Anion gap: 9 (ref 5–15)
BUN: 54 mg/dL — ABNORMAL HIGH (ref 8–23)
CO2: 21 mmol/L — ABNORMAL LOW (ref 22–32)
Calcium: 7.5 mg/dL — ABNORMAL LOW (ref 8.9–10.3)
Chloride: 112 mmol/L — ABNORMAL HIGH (ref 98–111)
Creatinine, Ser: 4.23 mg/dL — ABNORMAL HIGH (ref 0.61–1.24)
GFR, Estimated: 14 mL/min — ABNORMAL LOW (ref >60)
Glucose, Bld: 101 mg/dL — ABNORMAL HIGH (ref 70–99)
Potassium: 3.6 mmol/L (ref 3.5–5.1)
Sodium: 142 mmol/L (ref 135–145)
Total Bilirubin: 1.4 mg/dL — ABNORMAL HIGH (ref 0.0–1.2)
Total Protein: 5.3 g/dL — ABNORMAL LOW (ref 6.5–8.1)

## 2023-12-15 LAB — GLUCOSE, CAPILLARY
Glucose-Capillary: 67 mg/dL — ABNORMAL LOW (ref 70–99)
Glucose-Capillary: 76 mg/dL (ref 70–99)
Glucose-Capillary: 101 mg/dL — ABNORMAL HIGH (ref 70–99)
Glucose-Capillary: 114 mg/dL — ABNORMAL HIGH (ref 70–99)
Glucose-Capillary: 53 mg/dL — ABNORMAL LOW (ref 70–99)
Glucose-Capillary: 156 mg/dL — ABNORMAL HIGH (ref 70–99)

## 2023-12-15 LAB — CBC
HCT: 22.6 % — ABNORMAL LOW (ref 39.0–52.0)
Hemoglobin: 7.2 g/dL — ABNORMAL LOW (ref 13.0–17.0)
MCH: 29.4 pg (ref 26.0–34.0)
MCHC: 31.9 g/dL (ref 30.0–36.0)
MCV: 92.2 fL (ref 80.0–100.0)
Platelets: 145 K/uL — ABNORMAL LOW (ref 150–400)
RBC: 2.45 MIL/uL — ABNORMAL LOW (ref 4.22–5.81)
RDW: 17.1 % — ABNORMAL HIGH (ref 11.5–15.5)
WBC: 6.3 K/uL (ref 4.0–10.5)
nRBC: 0 % (ref 0.0–0.2)

## 2023-12-15 MED ORDER — ALPRAZOLAM 0.5 MG PO TABS
0.5000 mg | ORAL_TABLET | Freq: Once | ORAL | Status: AC
  Administered 2023-12-15: 0.5 mg via ORAL
  Filled 2023-12-15: qty 1

## 2023-12-15 MED ORDER — ELUXADOLINE 75 MG PO TABS
75.0000 mg | ORAL_TABLET | Freq: Two times a day (BID) | ORAL | Status: DC
  Administered 2023-12-16 – 2023-12-17 (×3): 75 mg via ORAL

## 2023-12-15 MED ORDER — ALPRAZOLAM 0.5 MG PO TABS
0.5000 mg | ORAL_TABLET | Freq: Two times a day (BID) | ORAL | Status: DC | PRN
Start: 2023-12-15 — End: 2023-12-17
  Administered 2023-12-16: 0.5 mg via ORAL
  Filled 2023-12-15: qty 1

## 2023-12-15 MED ORDER — SODIUM CHLORIDE 0.9 % IV BOLUS
2500.0000 mL | Freq: Once | INTRAVENOUS | Status: AC
  Administered 2023-12-15: 2500 mL via INTRAVENOUS

## 2023-12-15 NOTE — Progress Notes (Signed)
  Progress Note   Patient: Kenneth Lawson FMW:[id number] DOB: 1945/12/30 DOA: 12/13/2023     2 DOS: the patient was seen and examined on 12/15/2023 at 10:11AM      Brief hospital course: 78 y.o. M with DM, BPH, mood disorder, HTN and anemia who presents with pallor, fatigue insidious onset over few weeks while traveling in [place], found to have Hgb 7 g/dL and Cr 6.2 mg/dL.  Admitted for further work up.        Assessment and plan: Acute renal failure Urine output 1.1 L.  Creatinine down to 4.2 with fluids. -Continue IV fluids - Consult nephrology, appreciate expertise.  Anemia FOBT negative.  Reticulocyte index low.  Hgb 6.2 on admission, improved with 2units but now trending down again. - Consult hematology   Hypoxemia It appears this was spurious  Type 2 diabetes Glucoses controlled - Hold glimepiride , semaglutide , alogliptin /pioglitazone  - Continue Lipitor - Continue sliding scale corrections  Mood disorder - Continue bupropion, BuSpar, for Kenneth Lawson  IBS - Continue home Viberzi  BPH - Continue finasteride, alfuzosin         Subjective: Patient had a lot of diarrhea overnight.  He said no rectal bleeding, no melena, no dizziness, no confusion, no fever.  Urine output seems okay     Physical Exam: BP (!) 116/57 (BP Location: Right Arm)   Pulse 81   Temp 97.7 F (36.5 C) (Oral)   Resp 16   Ht 5' 8 (1.727 m)   Wt 79 kg   SpO2 94%   BMI 26.48 kg/m   Adult male, lying in bed, interactive and appropriate RRR, no murmurs, no peripheral edema Respiratory rate normal, lungs clear without rales or wheezes Mildly pale Abdomen soft, no tenderness palpation or guarding, no ascites or distention Attention normal, affect normal, judgment and insight appear normal, face symmetric, speech fluent    Data Reviewed: Basic metabolic panel shows creatinine down to 4.2, BUN down to 54, potassium normal CBC shows hemoglobin down to 7.2, platelets stable at  145 Discussed with nephrology and oncology     Family Communication: Wife at the bedside    Disposition: Status is: Inpatient         Author: [name], MD 12/15/2023 4:21 PM  For on call review [email or web address].

## 2023-12-15 NOTE — Progress Notes (Signed)
 Pt receive grape juice and graham cracker and peanut butter. Patient can not take orange juice. Blood sugar is 114 after juice. Patient is waiting on food tray. Encourage for him to eat as much as he could.

## 2023-12-15 NOTE — Progress Notes (Signed)
 [facility] Progress Note  Subjective:    Vitals:   12/14/23 2140 12/15/23 0500 12/15/23 0509 12/15/23 0950  BP: (!) 150/60  (!) 154/76 (!) 116/57  Pulse: 88  89 81  Resp: 16  17 16   Temp: 98.2 F (36.8 C)  98.1 F (36.7 C) 97.7 F (36.5 C)  TempSrc: Oral  Oral Oral  SpO2: 91%  95% 94%  Weight:  79 kg    Height:        Exam: Gen alert, no distress, pleasant elderly male Sclera anicteric, throat clear, slightly dry No jvd or bruits Chest clear bilat to bases RRR no MRG Abd soft ntnd no mass or ascites +bs, no bruit Ext no LE or UE edema, no other edema Neuro is alert, Ox 3 , nf, no asterixis       Home meds: Uroxatral, alogliptin -pioglitazone , xanax prn, lipitor, wellbutrin XL, buspar prn, vit B12, proscar, gabapentin, amaryl , semaglutide , tramadol, vilazodone   Date                             Creat               eGFR (ml/min) 2014- October 2024  0.80- 1.08 Apr 2023                     1.34                 55 ml/min         10/17                           6.76                 8           12/14/23                      6.25                 9     Old labs: Feb 2025 free LC ratio --> kappa 23 mg per L/ lambda 14 mg per L, and ratio = 1.63 (wnl)   UA- prot neg, 0-5 rbc/ wbc/ epi Renal US  - no hydro, 11.4/ 11.9 cm kidneys CXR - LLL atx, no other acute findings UNa 50, UCreat 76 UP/C ratio: 0.20      Assessment/ Plan: AKI on CKD 3a: b/l creatinine is 1.4 from feb 2025, eGFR 55 ml/min. Creat here was 6.7 on admission, in the setting of severe anemia (acute on chronic), gen'd weakness, borderline thrombocytopenia. UA was negative, UP/C ratio is low and renal US  showed no obstruction. BP's were wnl, pt has DM2 but not on insulin, low A1c 4.8- 5.8. FLC ratio in Feb was wnl. Calcium  is normal, albumin was too high at 4.0 (probably dry). Not taking nsaids or PPI's. Bland UA w/ no proteinuria and no obstruction. BP's wnl. DDx is volume depletion, AIN, poss ATN,  myeloma kidney, TMA vs other. Not uremic at this time and does not require RRT. Sent off serologies, hep panel and myeloma studies. Gave 2.5 L bolus overnight and creat down to 4.3 this am. Encouraged w/ drop in creatinine. Having loose stools all night per the wife last night. Will rebolus 2.5 L, repeat creat in am; may be all volume depletion. Resume bicarb gtt as well after bolus finished.  Serologies / myeloma studies pending. Will follow.   Met acidosis: resume IV bicarb infusion at 75 cc/hr after bolus completed Chronic back pain B12 def Chronic anemia: f/b hematology in OP setting DM2: on oral meds only     Rob Sonnet Rizor MD  [facility] 12/15/2023, 2:26 PM  Recent Labs  Lab 12/13/23 2025 12/14/23 0547 12/14/23 0620 12/14/23 1146 12/14/23 2032 12/15/23 0818 12/15/23 1108  HGB 6.3*   < >  --    < > 7.5* 7.2*  --   ALBUMIN 4.0  --   --   --   --   --  2.7*  CALCIUM  9.0  --  8.4*  --   --   --  7.5*  CREATININE 6.76*  --  6.25*  --   --   --  4.23*  K 4.3  --  4.1  --   --   --  3.6   < > = values in this interval not displayed.   Recent Labs  Lab 12/14/23 0620  IRON 48  TIBC 267  FERRITIN 391*   Inpatient medications:  sodium chloride   Intravenous Once   alfuzosin  10 mg Oral QHS   atorvastatin   40 mg Oral Daily   buPROPion  300 mg Oral Daily   busPIRone  10 mg Oral BID   Eluxadoline   Oral BID   finasteride  5 mg Oral Daily   insulin aspart  0-5 Units Subcutaneous QHS   insulin aspart  0-9 Units Subcutaneous TID WC   Vilazodone HCl  20 mg Oral QHS    sodium bicarbonate 150 mEq in sterile water 1,150 mL infusion 75 mL/hr at 12/15/23 0500   acetaminophen  **OR** acetaminophen , labetalol

## 2023-12-15 NOTE — Progress Notes (Signed)
 Patient wife brought her copy of the Medical advance directive. Copy has been made and place in the patient chart. Original copy given to the wife.

## 2023-12-15 NOTE — Progress Notes (Signed)
 [facility] CONSULT NOTE  Patient Care Team: [name], MD as PCP - General (Family Medicine)  CHIEF COMPLAINTS/PURPOSE OF CONSULTATION:  Pancytopenia.  ASSESSMENT & PLAN:   This is a very pleasant 78 yr old male patient with known history of pancytopenia, mild overall, B12 deficiency who is on active surveillance here with chief complaint of worsening fatigue, pallor.  He was noted to have worsening anemia, Hb was 6.7, normal WBC and platelets CMP showed severe AKI, his baseline creatinine was 1.34, his creatinine at admission was 6.76. BUN 74.   This looks like pre renal injury or a post renal injury. US  without any evidence of obstruction. I dont think this is MM, corrected calcium  is just under 9, total protein is low, and also this is a sudden change in Hb.  I would recommend ruling out a GI blood which can cause anemia and pre renal AKI. If no concern for GI bleed, then this could very well be severe dehydration. I see his BUN and creatinine have improved already with hydration.  If Hb continues to fall despite blood transfusion, we can consider a marrow in house. If it improves and stabilizes after blood transfusion, then we can consider bone marrow biopsy outpt.  I will continue to monitor him peripherally. Thanks for consulting us  in the care of the patient, we appreciate primary team's help in taking care of the patient.  HISTORY OF PRESENTING ILLNESS:  Kenneth Lawson 78 y.o. male is here because of Pancytopenia.  History of Present Illness    Kenneth Lawson is a 78 year old male with pancytopenia who is known to me admitted with chief complaint of worsening fatigue, pallor and halitosis. He was travelling in [place], his wife noticed that he was weaker, may be yellow and had a bad breath. She says he was barely drinking any water like may be a bottle a day, was drinking soda mostly. He denies having much alcohol.  Rest of the pertinent 10 point ROS  reviewed and neg.   MEDICAL HISTORY:  Past Medical History:  Diagnosis Date   Arthritis    Diabetes mellitus without complication (HCC)    Hypercholesterolemia    Hypertension     SURGICAL HISTORY: Past Surgical History:  Procedure Laterality Date   LAPARASCOPIC MEDICAN ARCUATE LIGAMENT RELEASE     MINOR HEMORRHOIDECTOMY N/A     SOCIAL HISTORY: Social History   Socioeconomic History   Marital status: Married    Spouse name: Not on file   Number of children: Not on file   Years of education: Not on file   Highest education level: Not on file  Occupational History   Not on file  Tobacco Use   Smoking status: Former    Types: Cigarettes   Smokeless tobacco: Never  Vaping Use   Vaping status: Not on file  Substance and Sexual Activity   Alcohol use: Yes    Comment: rarely   Drug use: Never   Sexual activity: Not on file  Other Topics Concern   Not on file  Social History Narrative   Pt lives with wife    Retired    Chief Executive Officer Drivers of Corporate investment banker Strain: Not on file  Food Insecurity: No Food Insecurity (12/14/2023)   Hunger Vital Sign    Worried About Running Out of Food in the Last Year: Never true    Ran Out of Food in the Last Year: Never true  Transportation Needs: No  Transportation Needs (12/14/2023)   PRAPARE - Administrator, Civil Service (Medical): No    Lack of Transportation (Non-Medical): No  Physical Activity: Low Risk (07/18/2023)   Received from [facility]   PCARE Exercise SDOH    Exercise: Aerobic    PCare Exercise SDOH: Not on file    PCare Exercise SDOH: Not on file  Stress: Not on file  Social Connections: Moderately Integrated (12/14/2023)   Social Connection and Isolation Panel    Frequency of Communication with Friends and Family: More than three times a week    Frequency of Social Gatherings with Friends and Family: More than three times a week    Attends Religious Services: More than 4  times per year    Active Member of [employer] or Organizations: No    Attends Banker Meetings: Never    Marital Status: Married  Catering manager Violence: Not At Risk (12/15/2023)   Humiliation, Afraid, Rape, and Kick questionnaire    Fear of Current or Ex-Partner: No    Emotionally Abused: No    Physically Abused: No    Sexually Abused: No    FAMILY HISTORY: Family History  Problem Relation Age of Onset   Alzheimer's disease Mother    Cancer Neg Hx    Parkinson's disease Neg Hx    Tremor Neg Hx     ALLERGIES:  is allergic to metformin.  MEDICATIONS:  Current Facility-Administered Medications  Medication Dose Route Frequency Provider Last Rate Last Admin   0.9 %  sodium chloride infusion (Manually program via Guardrails IV Fluids)   Intravenous Once Rathore, Vasundhra, MD       acetaminophen  (TYLENOL ) tablet 650 mg  650 mg Oral Q6H PRN Alfornia Madison, MD       Or   acetaminophen  (TYLENOL ) suppository 650 mg  650 mg Rectal Q6H PRN Alfornia Madison, MD       alfuzosin (UROXATRAL) 24 hr tablet 10 mg  10 mg Oral QHS Rathore, Vasundhra, MD   10 mg at 12/14/23 2257   atorvastatin  (LIPITOR) tablet 40 mg  40 mg Oral Daily Rathore, Vasundhra, MD   40 mg at 12/15/23 1128   buPROPion (WELLBUTRIN XL) 24 hr tablet 300 mg  300 mg Oral Daily Rathore, Vasundhra, MD   300 mg at 12/15/23 1127   busPIRone (BUSPAR) tablet 10 mg  10 mg Oral BID Rathore, Vasundhra, MD   10 mg at 12/15/23 1127   Eluxadoline TABS   Oral BID Rathore, Vasundhra, MD   Given at 12/15/23 1127   finasteride (PROSCAR) tablet 5 mg  5 mg Oral Daily Rathore, Vasundhra, MD   5 mg at 12/15/23 1128   insulin aspart (novoLOG) injection 0-5 Units  0-5 Units Subcutaneous QHS Rathore, Vasundhra, MD       insulin aspart (novoLOG) injection 0-9 Units  0-9 Units Subcutaneous TID WC Rathore, Vasundhra, MD       labetalol (NORMODYNE) injection 5 mg  5 mg Intravenous Q2H PRN Rathore, Vasundhra, MD       sodium bicarbonate  150 mEq in sterile water 1,150 mL infusion   Intravenous Continuous Geralynn Charleston, MD 75 mL/hr at 12/15/23 0500 Infusion Verify at 12/15/23 0500   sodium chloride 0.9 % bolus 2,500 mL  2,500 mL Intravenous Once Schertz, Robert, MD       Vilazodone HCl TABS 20 mg  20 mg Oral QHS Rathore, Vasundhra, MD   20 mg at 12/14/23 2258     PHYSICAL  EXAMINATION: ECOG PERFORMANCE STATUS: 0 - Asymptomatic  Vitals:   12/15/23 0509 12/15/23 0950  BP: (!) 154/76 (!) 116/57  Pulse: 89 81  Resp: 17 16  Temp: 98.1 F (36.7 C) 97.7 F (36.5 C)  SpO2: 95% 94%     Filed Weights   12/14/23 1332 12/15/23 0500  Weight: 171 lb 8.3 oz (77.8 kg) 174 lb 2.6 oz (79 kg)      GENERAL:alert, no distress and comfortable, pallor noted. LYMPH:  no palpable lymphadenopathy in the cervical, axillary LUNGS: clear to auscultation and percussion with normal breathing effort HEART: regular rate & rhythm and no murmurs and no lower extremity edema ABDOMEN:abdomen soft, non-tender and normal bowel sounds No LE edema.  LABORATORY DATA:  I have reviewed the data as listed Lab Results  Component Value Date   WBC 6.3 12/15/2023   HGB 7.2 (L) 12/15/2023   HCT 22.6 (L) 12/15/2023   MCV 92.2 12/15/2023   PLT 145 (L) 12/15/2023     Chemistry      Component Value Date/Time   NA 142 12/15/2023 1108   NA 140 02/13/2019 1558   K 3.6 12/15/2023 1108   CL 112 (H) 12/15/2023 1108   CO2 21 (L) 12/15/2023 1108   BUN 54 (H) 12/15/2023 1108   BUN 19 02/13/2019 1558   CREATININE 4.23 (H) 12/15/2023 1108   CREATININE 1.34 (H) 04/09/2023 1557      Component Value Date/Time   CALCIUM  7.5 (L) 12/15/2023 1108   ALKPHOS 48 12/15/2023 1108   AST 19 12/15/2023 1108   AST 14 (L) 04/09/2023 1557   ALT 26 12/15/2023 1108   ALT 15 04/09/2023 1557   BILITOT 1.4 (H) 12/15/2023 1108   BILITOT 0.5 04/09/2023 1557       RADIOGRAPHIC STUDIES: I have personally reviewed the radiological images as listed and agreed with the  findings in the report. US  RENAL Result Date: 12/14/2023 EXAM: US  Retroperitoneum Complete, Renal. CLINICAL HISTORY: AKI (acute kidney injury) 409830. TECHNIQUE: Real-time ultrasound of the retroperitoneum (complete) with image documentation. COMPARISON: CT of the abdomen and pelvis dated 02/10/2022. FINDINGS: RIGHT KIDNEY: The right kidney is normal in size and echogenicity, measuring 11.9 x 5.9 x 4.2 cm, within an estimated volume of 152 ml. There is a complex cystic lesion again seen arising from the lower pole, measuring approximately 3.6 x 3.1 x 3.4 cm (previously measured 3.2 x 3.0 x 3.4 cm). There is also a 3.5 mm nonobstructing calculus now seen within the superior pole. No hydronephrosis. LEFT KIDNEY: The left kidney is normal in size and echogenicity, measuring 11.4 x 5.5 x 4.2 cm, with an estimated volume of 138 ml. The left kidney remains unremarkable. No hydronephrosis, renal stone, or mass visualized. BLADDER: Unremarkable as visualized. IMPRESSION: 1. Complex cystic lesion in the lower pole of the right kidney, measuring approximately 3.6 x 3.1 x 3.4 cm, increased in size compared to the prior CT dated 02/10/2022. 2. 3.5 mm nonobstructing calculus in the superior pole of the right kidney. 3. No hydronephrosis. Electronically signed by: Evalene Coho MD 12/14/2023 06:47 AM EDT RP Workstation: [id number]   DG CHEST PORT 1 VIEW Result Date: 12/14/2023 CLINICAL DATA:  Hypoxia EXAM: PORTABLE CHEST 1 VIEW COMPARISON:  03/22/2023 FINDINGS: Streaky opacity at the left base suggest atelectasis although component of pneumonia is not excluded. Right lung clear. Cardiopericardial silhouette is at upper limits of normal for size. No acute bony abnormality. Telemetry leads overlie the chest. IMPRESSION: Streaky opacity at the left base  suggests atelectasis although a component of pneumonia is not excluded. Electronically Signed   By: Camellia Candle M.D.   On: 12/14/2023 05:09     All questions were  answered. The patient knows to call the clinic with any problems, questions or concerns.      Amber Stalls, MD 12/15/2023 2:37 PM

## 2023-12-15 NOTE — Progress Notes (Signed)
 Pt wife brought patient home medication Viberzi in today. Form is fill and place in chart.

## 2023-12-15 NOTE — Plan of Care (Signed)
  Problem: Nutrition: Goal: Adequate nutrition will be maintained Outcome: Not Progressing   Problem: Elimination: Goal: Will not experience complications related to urinary retention Outcome: Progressing   Problem: Safety: Goal: Ability to remain free from injury will improve Outcome: Progressing

## 2023-12-16 DIAGNOSIS — D649 Anemia, unspecified: Secondary | ICD-10-CM | POA: Diagnosis not present

## 2023-12-16 DIAGNOSIS — D696 Thrombocytopenia, unspecified: Secondary | ICD-10-CM | POA: Diagnosis not present

## 2023-12-16 DIAGNOSIS — N179 Acute kidney failure, unspecified: Secondary | ICD-10-CM | POA: Diagnosis not present

## 2023-12-16 LAB — GLUCOSE, CAPILLARY
Glucose-Capillary: 70 mg/dL (ref 70–99)
Glucose-Capillary: 88 mg/dL (ref 70–99)
Glucose-Capillary: 161 mg/dL — ABNORMAL HIGH (ref 70–99)
Glucose-Capillary: 76 mg/dL (ref 70–99)
Glucose-Capillary: 143 mg/dL — ABNORMAL HIGH (ref 70–99)

## 2023-12-16 LAB — RENAL FUNCTION PANEL
Albumin: 2.6 g/dL — ABNORMAL LOW (ref 3.5–5.0)
Anion gap: 9 (ref 5–15)
BUN: 38 mg/dL — ABNORMAL HIGH (ref 8–23)
CO2: 25 mmol/L (ref 22–32)
Calcium: 7.4 mg/dL — ABNORMAL LOW (ref 8.9–10.3)
Chloride: 114 mmol/L — ABNORMAL HIGH (ref 98–111)
Creatinine, Ser: 3.01 mg/dL — ABNORMAL HIGH (ref 0.61–1.24)
GFR, Estimated: 21 mL/min — ABNORMAL LOW (ref >60)
Glucose, Bld: 78 mg/dL (ref 70–99)
Phosphorus: 3.2 mg/dL (ref 2.5–4.6)
Potassium: 3.9 mmol/L (ref 3.5–5.1)
Sodium: 148 mmol/L — ABNORMAL HIGH (ref 135–145)

## 2023-12-16 LAB — ANTISTREPTOLYSIN O TITER: ASO: 287 [IU]/mL — ABNORMAL HIGH (ref 0.0–200.0)

## 2023-12-16 LAB — KAPPA/LAMBDA LIGHT CHAINS
Kappa free light chain: 47 mg/L — ABNORMAL HIGH (ref 3.3–19.4)
Kappa free light chain: 40.8 mg/L — ABNORMAL HIGH (ref 3.3–19.4)
Kappa, lambda light chain ratio: 1.53 (ref 0.26–1.65)
Kappa, lambda light chain ratio: 1.42 (ref 0.26–1.65)
Lambda free light chains: 30.8 mg/L — ABNORMAL HIGH (ref 5.7–26.3)
Lambda free light chains: 28.8 mg/L — ABNORMAL HIGH (ref 5.7–26.3)

## 2023-12-16 LAB — CBC
HCT: 19.8 % — ABNORMAL LOW (ref 39.0–52.0)
Hemoglobin: 6.4 g/dL — CL (ref 13.0–17.0)
MCH: 29.6 pg (ref 26.0–34.0)
MCHC: 32.3 g/dL (ref 30.0–36.0)
MCV: 91.7 fL (ref 80.0–100.0)
Platelets: 120 K/uL — ABNORMAL LOW (ref 150–400)
RBC: 2.16 MIL/uL — ABNORMAL LOW (ref 4.22–5.81)
RDW: 16.3 % — ABNORMAL HIGH (ref 11.5–15.5)
WBC: 4.9 K/uL (ref 4.0–10.5)
nRBC: 0 % (ref 0.0–0.2)

## 2023-12-16 LAB — HAPTOGLOBIN: Haptoglobin: <10 mg/dL — ABNORMAL LOW (ref 34–355)

## 2023-12-16 LAB — GLOMERULAR BASEMENT MEMBRANE ANTIBODIES: GBM Ab: <0.2 U (ref 0.0–0.9)

## 2023-12-16 LAB — ANCA TITERS
Atypical P-ANCA titer: <1:20 {titer}
C-ANCA: <1:20 {titer}
P-ANCA: <1:20 {titer}

## 2023-12-16 LAB — C3 COMPLEMENT: C3 Complement: 133 mg/dL (ref 82–167)

## 2023-12-16 LAB — C4 COMPLEMENT: Complement C4, Body Fluid: 22 mg/dL (ref 12–38)

## 2023-12-16 LAB — PREPARE RBC (CROSSMATCH)

## 2023-12-16 LAB — ANTINUCLEAR ANTIBODIES, IFA: ANA Ab, IFA: NEGATIVE

## 2023-12-16 MED ORDER — SODIUM CHLORIDE 0.9% IV SOLUTION: Freq: Once | INTRAVENOUS | Status: DC

## 2023-12-16 NOTE — Progress Notes (Signed)
 Kenneth Lawson   DOB:05-23-1945   FM#:[id number]      ASSESSMENT & PLAN:  Kenneth Lawson is a 78 year old male patient with hematologic complaints significant for pancytopenia.  Hematology following closely.  History of pancytopenia - Unclear etiology - Patient is agreeable to do CT guided bone marrow biopsy which has been arranged for tomorrow 10/21 at 8 AM.  IR team appreciated. - Hematology/Dr. Loretha following  and will see patient in outpatient [facility] upon discharge  Anemia - FOBT was negative - Hemoglobin low 6.4.  Patient is currently receiving PRBC transfusion. - Recommend PRBC transfusion for hemoglobin <7.0 - Consider GI evaluation although FOBT is negative  Thrombocytopenia - Mild - Platelets 120K - No transfusional intervention required  AKI -- Elevated creatinine and BUN however seems to be improving. - Avoid nephrotoxic agents, and NSAIDs - Continue hydration - Nephrology following  Fatigue - Likely secondary to severe anemia - Continue supportive care  Code Status Full   Subjective:  Patient seen resting quietly in bed.  Spouse at bedside.  Seen with PRBC transfusing well, patient tolerating well.  States that he feels okay, just very tired.  Patient and wife report willingness to do bone marrow biopsy for further evaluation.  No other acute complaints offered.  Objective:   Intake/Output Summary (Last 24 hours) at 12/16/2023 1330 Last data filed at 12/16/2023 1030 Gross per 24 hour  Intake 4984.36 ml  Output 525 ml  Net 4459.36 ml     PHYSICAL EXAMINATION: ECOG PERFORMANCE STATUS: 3 - Symptomatic, >50% confined to bed  Vitals:   12/16/23 1100 12/16/23 1137  BP: (!) 136/54 (!) 133/54  Pulse: 81 83  Resp: 18 18  Temp: 98 F (36.7 C) 98 F (36.7 C)  SpO2: 96% 96%   Filed Weights   12/14/23 1332 12/15/23 0500 12/16/23 0428  Weight: 171 lb 8.3 oz (77.8 kg) 174 lb 2.6 oz (79 kg) 171 lb 8.3 oz (77.8 kg)    GENERAL: alert, no distress and  comfortable +complains of weakness SKIN: +pale skin color, texture, turgor are normal, no rashes or significant lesions EYES: normal, conjunctiva are pink and non-injected, sclera clear OROPHARYNX: no exudate, no erythema and lips, buccal mucosa, and tongue normal  NECK: supple, thyroid  normal size, non-tender, without nodularity LYMPH: no palpable lymphadenopathy in the cervical, axillary or inguinal LUNGS: clear to auscultation and percussion with normal breathing effort HEART: regular rate & rhythm and no murmurs and no lower extremity edema ABDOMEN: abdomen soft, non-tender and normal bowel sounds MUSCULOSKELETAL: no cyanosis of digits and no clubbing  PSYCH: alert & oriented x 3 with fluent speech NEURO: no focal motor/sensory deficits   All questions were answered. The patient knows to call the clinic with any problems, questions or concerns.   The total time spent in the appointment was 40 minutes encounter with patient including review of chart and various tests results, discussions about plan of care and coordination of care plan  Olam JINNY Brunner, NP 12/16/2023 1:30 PM    Labs Reviewed:  Lab Results  Component Value Date   WBC 4.9 12/16/2023   HGB 6.4 (LL) 12/16/2023   HCT 19.8 (L) 12/16/2023   MCV 91.7 12/16/2023   PLT 120 (L) 12/16/2023   Recent Labs    04/09/23 1557 04/09/23 1557 12/13/23 2025 12/14/23 0620 12/15/23 1108 12/16/23 0741  NA 140  --  137 140 142 148*  K 4.0  --  4.3 4.1 3.6 3.9  CL 107  --  103  108 112* 114*  CO2 29  --  22 21* 21* 25  GLUCOSE 114*  --  155* 129* 101* 78  BUN 21  --  74* 72* 54* 38*  CREATININE 1.34*  --  6.76* 6.25* 4.23* 3.01*  CALCIUM  9.4  --  9.0 8.4* 7.5* 7.4*  GFRNONAA 55*   < > 8* 9* 14* 21*  PROT 7.1  --  6.7  --  5.3*  --   ALBUMIN 4.2  --  4.0  --  2.7* 2.6*  AST 14*  --  24  --  19  --   ALT 15  --  31  --  26  --   ALKPHOS 78  --  79  --  48  --   BILITOT 0.5  --  0.8  --  1.4*  --    < > = values in this  interval not displayed.    Studies Reviewed:  US  RENAL Result Date: 12/14/2023 EXAM: US  Retroperitoneum Complete, Renal. CLINICAL HISTORY: AKI (acute kidney injury) 409830. TECHNIQUE: Real-time ultrasound of the retroperitoneum (complete) with image documentation. COMPARISON: CT of the abdomen and pelvis dated 02/10/2022. FINDINGS: RIGHT KIDNEY: The right kidney is normal in size and echogenicity, measuring 11.9 x 5.9 x 4.2 cm, within an estimated volume of 152 ml. There is a complex cystic lesion again seen arising from the lower pole, measuring approximately 3.6 x 3.1 x 3.4 cm (previously measured 3.2 x 3.0 x 3.4 cm). There is also a 3.5 mm nonobstructing calculus now seen within the superior pole. No hydronephrosis. LEFT KIDNEY: The left kidney is normal in size and echogenicity, measuring 11.4 x 5.5 x 4.2 cm, with an estimated volume of 138 ml. The left kidney remains unremarkable. No hydronephrosis, renal stone, or mass visualized. BLADDER: Unremarkable as visualized. IMPRESSION: 1. Complex cystic lesion in the lower pole of the right kidney, measuring approximately 3.6 x 3.1 x 3.4 cm, increased in size compared to the prior CT dated 02/10/2022. 2. 3.5 mm nonobstructing calculus in the superior pole of the right kidney. 3. No hydronephrosis. Electronically signed by: Evalene Coho MD 12/14/2023 06:47 AM EDT RP Workstation: [id number]   DG CHEST PORT 1 VIEW Result Date: 12/14/2023 CLINICAL DATA:  Hypoxia EXAM: PORTABLE CHEST 1 VIEW COMPARISON:  03/22/2023 FINDINGS: Streaky opacity at the left base suggest atelectasis although component of pneumonia is not excluded. Right lung clear. Cardiopericardial silhouette is at upper limits of normal for size. No acute bony abnormality. Telemetry leads overlie the chest. IMPRESSION: Streaky opacity at the left base suggests atelectasis although a component of pneumonia is not excluded. Electronically Signed   By: Camellia Candle M.D.   On: 12/14/2023 05:09

## 2023-12-16 NOTE — Progress Notes (Signed)
  [facility] Progress Note    Assessment/ Plan:   AKI on CKD3a -baseline Cr around 1.4. Suspect AKI is secondary to severe anemia, prerenal -Peak Cr 6.75--continues to improve, down to 3 which is encouraging. No need for dialysis/biopsy at this time. Serologies pending. If still doing okay by tomorrow then can consider discharge and we can follow up the rest of labs as an outpatient. Stopping isotonic fluids at the moment (receiving PRBC) -Avoid nephrotoxic medications including NSAIDs and iodinated intravenous contrast exposure unless the latter is absolutely indicated.  Preferred narcotic agents for pain control are hydromorphone, fentanyl, and methadone. Morphine should not be used. Avoid Baclofen and avoid oral sodium phosphate and magnesium citrate based laxatives / bowel preps. Continue strict Input and Output monitoring. Will monitor the patient closely with you and intervene or adjust therapy as indicated by changes in clinical status/labs   Anemia Bicytopenia -hematology following -transfuse PRN for Hgb <7   Metabolic acidosis -stopping bicarb gtt, CO2 WNL  DM2 -mgmt per primary service  Complex right renal cyst -will need urology referral on discharge. Found on renal ultrasound  Subjective:   Patient seen and examined chairside. Wife in room. Receiving transfusion tolerating. No complaints. In good spirits   Objective:   BP (!) 133/54   Pulse 83   Temp 98 F (36.7 C) (Oral)   Resp 18   Ht 5' 8 (1.727 m)   Wt 77.8 kg   SpO2 96%   BMI 26.08 kg/m   Intake/Output Summary (Last 24 hours) at 12/16/2023 1337 Last data filed at 12/16/2023 1030 Gross per 24 hour  Intake 4984.36 ml  Output 525 ml  Net 4459.36 ml   Weight change: 0 kg  Physical Exam: Gen: NAD CVS: RRR Resp: CTA BL Abd: soft Ext: no sig edema b/l Les Neuro: awake, alert  Imaging: No results found.  Labs: BMET Recent Labs  Lab 12/13/23 2025 12/14/23 0620 12/15/23 1108  12/16/23 0741  NA 137 140 142 148*  K 4.3 4.1 3.6 3.9  CL 103 108 112* 114*  CO2 22 21* 21* 25  GLUCOSE 155* 129* 101* 78  BUN 74* 72* 54* 38*  CREATININE 6.76* 6.25* 4.23* 3.01*  CALCIUM  9.0 8.4* 7.5* 7.4*  PHOS  --   --   --  3.2   CBC Recent Labs  Lab 12/13/23 2025 12/14/23 0547 12/14/23 1146 12/14/23 2032 12/15/23 0818 12/16/23 0741  WBC 6.8 6.4  --  7.6 6.3 4.9  NEUTROABS 5.7  --   --  6.5  --   --   HGB 6.3* 6.2* 6.7* 7.5* 7.2* 6.4*  HCT 20.1* 19.9* 21.1* 23.5* 22.6* 19.8*  MCV 93.1 94.3  --  91.4 92.2 91.7  PLT 137* 145*  --  128* 145* 120*    Medications:     sodium chloride   Intravenous Once   alfuzosin  10 mg Oral QHS   atorvastatin   40 mg Oral Daily   buPROPion  300 mg Oral Daily   busPIRone  10 mg Oral BID   Eluxadoline  75 mg Oral BID   finasteride  5 mg Oral Daily   Vilazodone HCl  20 mg Oral QHS      Ephriam Stank, MD [facility] 12/16/2023, 1:37 PM

## 2023-12-16 NOTE — Progress Notes (Signed)
  Progress Note   Patient: Kenneth Lawson FMW:[id number] DOB: 06-10-1945 DOA: 12/13/2023     3 DOS: the patient was seen and examined on 12/16/2023 at 9:10AM and 2:20PM      Brief hospital course: 78 y.o. M with DM, BPH, mood disorder, HTN and anemia who presents with pallor, fatigue insidious onset over few weeks while traveling in [place], found to have Hgb 7 g/dL and Cr 6.2 mg/dL.  Admitted for further work up.        Assessment and plan: Acute renal failure Urine output good, creatinine down to 3 - Stop IV fluids - Push oral fluids  Anemia FOBT negative.  Reticulocyte index low.  Hemoglobin 6.2 on admission, transfused 2 units, and improved appropriately.  Now hemoglobin trending down again. - Bone marrow biopsy tomorrow - Transfuse 1 unit today - Consult hematology   Hypoxemia Resolved, likely spurious  Type 2 diabetes Had some hypoglycemia here in the hospital - Hold glimepiride , semaglutide , alogliptin /pioglitazone  at discharge - Continue Lipitor - Continue sliding scale corrections  Mood disorder - Continue bupropion, BuSpar, vilazodone  IBS - Continue home Viberzi  BPH - Continue finasteride, alfuzosin         Subjective: Feeling okay.  No new concerns, no nursing complaints     Physical Exam: BP (!) 147/62 (BP Location: Right Arm)   Pulse 82   Temp 97.8 F (36.6 C)   Resp 17   Ht 5' 8 (1.727 m)   Wt 77.8 kg   SpO2 97%   BMI 26.08 kg/m   Adult male, lying in bed, interactive and appropriate RRR, no murmurs, no peripheral edema Respiratory rate normal, lungs clear without rales or wheezes Mildly pale Abdomen soft, no tenderness palpation or guarding, no ascites or distention Attention normal, affect normal, judgment and insight appear normal, face symmetric, speech fluent    Data Reviewed: Discussed with oncology and nephrology Creatinine down to 3, hemoglobin down to 6.7    Family Communication: Wife at the  bedside    Disposition: Status is: Inpatient         Author: [name], MD 12/16/2023 6:48 PM  For on call review [email or web address].

## 2023-12-16 NOTE — Consult Note (Addendum)
 Chief Complaint: Pancytopenia  Referring Provider(s): Dr. Loretha (heme/onc)  Supervising Physician: Vanice Revel  Patient Status: [facility] - In-pt  History of Present Illness: Kenneth Lawson is a 78 y.o. male with PMH significant for DM, HTN, B12 deficiency, known pancytopenia who presented to the ED with worsening fatigue and pallor along with abnormal labs performed by PCP (GFR 8). He was admitted for AKI. Heme/onc consulted for review of anemia (6.7 inpatient). Overall recommendation by Dr. Loretha: I would recommend ruling out a GI blood which can cause anemia and pre renal AKI. If no concern for GI bleed, then this could very well be severe dehydration. I see his BUN and creatinine have improved already with hydration. If Hb continues to fall despite blood transfusion, we can consider a marrow in house. If it improves and stabilizes after blood transfusion, then we can consider bone marrow biopsy outpt.   Does not wear CPAP or use supplemental home O2   Wife at bedside. Patient sitting up in the side of bed. Denies fever, chills, SOB, CP, sore throat, N/V, abd pain, blood in stool or urine, abnormal bruising, leg swelling, back pain.   Allergies Reviewed:  Metformin  Return precautions and treatment recommendations and follow-up discussed with the patient and his wife.  Both who are agreeable with the plan.     Patient is Full Code  Past Medical History:  Diagnosis Date   Arthritis    Diabetes mellitus without complication (HCC)    Hypercholesterolemia    Hypertension     Past Surgical History:  Procedure Laterality Date   LAPARASCOPIC MEDICAN ARCUATE LIGAMENT RELEASE     MINOR HEMORRHOIDECTOMY N/A       Medications: Prior to Admission medications   Medication Sig Start Date End Date Taking? Authorizing Provider  alfuzosin (UROXATRAL) 10 MG 24 hr tablet Take 10 mg by mouth daily.   Yes [provider]  Alogliptin -Pioglitazone  25-30 MG TABS Take 1  tablet by mouth daily. Patient taking differently: Take 1 tablet by mouth daily. TAKE 1 TABLET BY MOUTH EVERY DAY 04/26/23  Yes Thapa, [place], MD  ALPRAZolam (XANAX) 0.5 MG tablet Take 0.5 mg by mouth every other day as needed for anxiety. Take 1/2 tablet by mouth every other day as needed. 10/20/13  Yes [provider]  buPROPion (WELLBUTRIN XL) 300 MG 24 hr tablet Take 300 mg by mouth daily. 03/31/20  Yes [provider]  busPIRone (BUSPAR) 10 MG tablet Take 5-10 mg by mouth 2 (two) times daily as needed (Anxiety). TAKE 1/2 TO 1 TABLET TWICE A DAY AS NEEDED FOR ANXIETY 07/24/22  Yes [provider]  cyanocobalamin  (VITAMIN B12) 1000 MCG/ML injection Inject 1 mL (1,000 mcg total) into the muscle once a week. 07/26/23  Yes Iruku, Praveena, MD  Eluxadoline (VIBERZI) 75 MG TABS Take 75 mg by mouth 2 (two) times daily.   Yes [provider]  finasteride (PROSCAR) 5 MG tablet Take 5 mg by mouth daily. 01/20/14  Yes [provider]  gabapentin (NEURONTIN) 300 MG capsule Take 300 mg by mouth See admin instructions. 1 CAPSULE THREE TIMES DAILY WITH ONE EXTRA AT NIGHT AS NEEDED. ORALLY ONCE A DAY   Yes [provider]  glimepiride  (AMARYL ) 4 MG tablet TAKE 1 TABLET AT BREAKFAST AND TAKE 1/2  AT LUNCH IF EATING A MEAL, OTHERWISE SKIP Patient taking differently: Take 4 mg by mouth See admin instructions. TAKE 1 TABLET AT BREAKFAST AND TAKE 1/2  AT LUNCH IF EATING  A MEAL, OTHERWISE SKIP 04/26/23  Yes Thapa, [place], MD  mometasone (NASONEX) 50 MCG/ACT nasal spray Place 2 sprays into the nose daily as needed (Sinus). 09/11/14  Yes [provider]  Semaglutide  (RYBELSUS ) 7 MG TABS TAKE 1 TABLET DAILY BEFORE BREAKFAST WITH HALF GLASS  OF WATER. Patient taking differently: Take 7 mg by mouth daily. TAKE 1 TABLET DAILY BEFORE BREAKFAST WITH HALF GLASS  OF WATER. 06/24/23  Yes Thapa, [place], MD  traMADol (ULTRAM) 50 MG tablet Take 50 mg by mouth 3 (three) times daily as  needed for moderate pain (pain score 4-6). 07/23/22  Yes [provider]  Vilazodone HCl 20 MG TABS Take 20 mg by mouth daily.   Yes [provider]  ACCU-CHEK GUIDE test strip USE TO CHECK BLOOD SUGARS THREE TIMES DAILY. 10/17/20   [place], MD  Accu-Chek Softclix Lancets lancets 3 times daily As directed DXE11.65 04/11/21   Shamleffer, Ibtehal Jaralla, MD  atorvastatin  (LIPITOR) 80 MG tablet Take 0.5 tablets (40 mg total) by mouth daily. 12/16/23   Thapa, [place], MD  NEEDLE, DISP, 23 G (BD DISP NEEDLE) 23G X 1 MISC Brand substitution OK 08/16/23   Iruku, Praveena, MD  tadalafil (CIALIS) 5 MG tablet Take 5 mg by mouth daily as needed for erectile dysfunction.    [provider]     Family History  Problem Relation Age of Onset   Alzheimer's disease Mother    Cancer Neg Hx    Parkinson's disease Neg Hx    Tremor Neg Hx     Social History   Socioeconomic History   Marital status: Married    Spouse name: Not on file   Number of children: Not on file   Years of education: Not on file   Highest education level: Not on file  Occupational History   Not on file  Tobacco Use   Smoking status: Former    Types: Cigarettes   Smokeless tobacco: Never  Vaping Use   Vaping status: Not on file  Substance and Sexual Activity   Alcohol use: Yes    Comment: rarely   Drug use: Never   Sexual activity: Not on file  Other Topics Concern   Not on file  Social History Narrative   Pt lives with wife    Retired    Social Drivers of Corporate investment banker Strain: Not on file  Food Insecurity: No Food Insecurity (12/14/2023)   Hunger Vital Sign    Worried About Running Out of Food in the Last Year: Never true    Ran Out of Food in the Last Year: Never true  Transportation Needs: No Transportation Needs (12/14/2023)   PRAPARE - Administrator, Civil Service (Medical): No    Lack of Transportation (Non-Medical): No  Physical Activity: Low Risk  (07/18/2023)   Received from [facility]   PCARE Exercise SDOH    Exercise: Aerobic    PCare Exercise SDOH: Not on file    PCare Exercise SDOH: Not on file  Stress: Not on file  Social Connections: Moderately Integrated (12/14/2023)   Social Connection and Isolation Panel    Frequency of Communication with Friends and Family: More than three times a week    Frequency of Social Gatherings with Friends and Family: More than three times a week    Attends Religious Services: More than 4 times per year    Active Member of Clubs or Organizations: No    Attends  Club or Organization Meetings: Never    Marital Status: Married     Review of Systems: A 12 point ROS discussed and pertinent positives are indicated in the HPI above.  All other systems are negative.    Vital Signs: BP (!) (P) 144/61   Pulse (P) 76   Temp (P) 98 F (36.7 C) (Oral)   Resp (P) 18   Ht 5' 8 (1.727 m)   Wt 171 lb 8.3 oz (77.8 kg)   SpO2 (P) 99%   BMI 26.08 kg/m   Advance Care Plan: No documents on file    Physical Exam Vitals and nursing note reviewed.  Constitutional:      Appearance: He is well-developed.  HENT:     Head: Normocephalic.     Mouth/Throat:     Mouth: Mucous membranes are moist.  Eyes:     Pupils: Pupils are equal, round, and reactive to light.  Cardiovascular:     Rate and Rhythm: Normal rate and regular rhythm.  Pulmonary:     Effort: Pulmonary effort is normal.  Musculoskeletal:        General: Normal range of motion.     Cervical back: Normal range of motion.  Skin:    General: Skin is warm and dry.     Coloration: Skin is pale.  Neurological:     General: No focal deficit present.     Mental Status: He is alert and oriented to person, place, and time. Mental status is at baseline.  Psychiatric:        Mood and Affect: Mood normal.        Behavior: Behavior normal.        Thought Content: Thought content normal.        Judgment: Judgment normal.      Imaging: US  RENAL Result Date: 12/14/2023 EXAM: US  Retroperitoneum Complete, Renal. CLINICAL HISTORY: AKI (acute kidney injury) 409830. TECHNIQUE: Real-time ultrasound of the retroperitoneum (complete) with image documentation. COMPARISON: CT of the abdomen and pelvis dated 02/10/2022. FINDINGS: RIGHT KIDNEY: The right kidney is normal in size and echogenicity, measuring 11.9 x 5.9 x 4.2 cm, within an estimated volume of 152 ml. There is a complex cystic lesion again seen arising from the lower pole, measuring approximately 3.6 x 3.1 x 3.4 cm (previously measured 3.2 x 3.0 x 3.4 cm). There is also a 3.5 mm nonobstructing calculus now seen within the superior pole. No hydronephrosis. LEFT KIDNEY: The left kidney is normal in size and echogenicity, measuring 11.4 x 5.5 x 4.2 cm, with an estimated volume of 138 ml. The left kidney remains unremarkable. No hydronephrosis, renal stone, or mass visualized. BLADDER: Unremarkable as visualized. IMPRESSION: 1. Complex cystic lesion in the lower pole of the right kidney, measuring approximately 3.6 x 3.1 x 3.4 cm, increased in size compared to the prior CT dated 02/10/2022. 2. 3.5 mm nonobstructing calculus in the superior pole of the right kidney. 3. No hydronephrosis. Electronically signed by: Evalene Coho MD 12/14/2023 06:47 AM EDT RP Workstation: [id number]   DG CHEST PORT 1 VIEW Result Date: 12/14/2023 CLINICAL DATA:  Hypoxia EXAM: PORTABLE CHEST 1 VIEW COMPARISON:  03/22/2023 FINDINGS: Streaky opacity at the left base suggest atelectasis although component of pneumonia is not excluded. Right lung clear. Cardiopericardial silhouette is at upper limits of normal for size. No acute bony abnormality. Telemetry leads overlie the chest. IMPRESSION: Streaky opacity at the left base suggests atelectasis although a component of pneumonia is not excluded. Electronically Signed  By: Camellia Candle M.D.   On: 12/14/2023 05:09    Labs:  CBC: Recent Labs     12/14/23 0547 12/14/23 1146 12/14/23 2032 12/15/23 0818 12/16/23 0741  WBC 6.4  --  7.6 6.3 4.9  HGB 6.2* 6.7* 7.5* 7.2* 6.4*  HCT 19.9* 21.1* 23.5* 22.6* 19.8*  PLT 145*  --  128* 145* 120*    COAGS: Recent Labs    12/13/23 2025  INR 1.0    BMP: Recent Labs    12/13/23 2025 12/14/23 0620 12/15/23 1108 12/16/23 0741  NA 137 140 142 148*  K 4.3 4.1 3.6 3.9  CL 103 108 112* 114*  CO2 22 21* 21* 25  GLUCOSE 155* 129* 101* 78  BUN 74* 72* 54* 38*  CALCIUM  9.0 8.4* 7.5* 7.4*  CREATININE 6.76* 6.25* 4.23* 3.01*  GFRNONAA 8* 9* 14* 21*    LIVER FUNCTION TESTS: Recent Labs    04/09/23 1557 12/13/23 2025 12/15/23 1108 12/16/23 0741  BILITOT 0.5 0.8 1.4*  --   AST 14* 24 19  --   ALT 15 31 26   --   ALKPHOS 78 79 48  --   PROT 7.1 6.7 5.3*  --   ALBUMIN 4.2 4.0 2.7* 2.6*    TUMOR MARKERS: No results for input(s): AFPTM, CEA, CA199, CHROMGRNA in the last 8760 hours.  Assessment and Plan:  Request for  image guided bone marrow biopsy and aspiration approved for 12/17/23 by Dr. Vanice. No contraindications for procedure identified in ROS, physical exam, or review of pre-sedation considerations. CBC w/ diff AM of procedure.  NPO MN prior to procedure VSS, afebrile Patient not asked to hold any AC/AP for this low bleeding risk procedure Abx not indicated    Risks and benefits of image guided bone marrow biopsy and aspiration was discussed with the patient and/or patient's family including, but not limited to bleeding, infection, damage to adjacent structures or low yield requiring additional tests.  All of the questions were answered and there is agreement to proceed.  Consent signed and in chart.   Thank you for allowing our service to participate in Shivaan Tierno 's care.    Electronically Signed: Laymon Coast, NP   12/16/2023, 3:17 PM     I spent a total of 20 Minutes    in face to face in clinical consultation, greater than 50% of  which was counseling/coordinating care for image guided bone marrow biopsy and aspiration.    (A copy of this note was sent to the referring provider and the time of visit.)

## 2023-12-16 NOTE — Progress Notes (Signed)
 Hand over for the patient received and the patient is a fall risk. As per day shift nurse the doctor and the wife requested for bed alarms not to be set.

## 2023-12-16 NOTE — Progress Notes (Signed)
 Call received that patient wanted to use urinal.  Assisted patient with urinal unfortunately patient spilled urine and was incontinent of urine.  States that he does not wear depends at home but he does urinate a lot.  Stood patient up and noted that he was wobbling around and repeating himself.  When he stood to wipe himself he almost fell.  Assisted patient to chair and chair alarm placed.  While patient was in chair kept repeating that he needed to wipe down - which he had just done.  Patient cleaned again by said nurses.   Offered to order breakfast.  [place] he would do it later.

## 2023-12-16 NOTE — Plan of Care (Signed)

## 2023-12-16 NOTE — Evaluation (Signed)
 Physical Therapy Evaluation Patient Details Name: Kenneth Lawson MRN: [id number] DOB: 14-Jul-1945 Today's Date: 12/16/2023  History of Present Illness  Pt is 78 yo presenting [facility] on 10/17 due to worsening fatigue, pallor and halitosis. Found to have AKI and low Hgb. PMH: pancytopenia, arthritis, DM, hypercholesterolemia, HTN, cervical/lumbar disease.  Clinical Impression  Pt is presenting at Mod I for bed mobility, sit to stand and gait without an AD. Pt was supervision for stairs per home set up.  Spouse is present and supportive. Pt will contact MD if he wants some OPPT for balance after some testing that needs to be done. Will benefit from continued mobility with mobility team and nursing staff. Currently pt is presenting close to baseline level of functioning and no skilled physical therapy services recommended. Pt will be discharged from skilled physical therapy services at this time; please re-consult if further needs arise.          If plan is discharge home, recommend the following: Other (comment);Help with stairs or ramp for entrance (as needed)     Equipment Recommendations None recommended by PT     Functional Status Assessment Patient has had a recent decline in their functional status and/or demonstrates limited ability to make significant improvements in function in a reasonable and predictable amount of time     Precautions / Restrictions Precautions Precautions: Fall Recall of Precautions/Restrictions: Intact Restrictions Weight Bearing Restrictions Per Provider Order: No      Mobility  Bed Mobility Overal bed mobility: Modified Independent       General bed mobility comments: minimal use of bed rails, HOB flat.    Transfers Overall transfer level: Modified independent Equipment used: None     General transfer comment: slightly increased BOS    Ambulation/Gait Ambulation/Gait assistance: Modified independent (Device/Increase time) Gait Distance  (Feet): 400 Feet Assistive device: None Gait Pattern/deviations: Step-through pattern, Trunk flexed Gait velocity: slightly decreased Gait velocity interpretation: >2.62 ft/sec, indicative of community ambulatory   General Gait Details: pt tends to take quick steps with top heavy COM over BOS occasionally requiring pt to quick step to catch up to maintain balance. Decreased heel toe gait pattern. No overt LOB  Stairs Stairs: Yes Stairs assistance: Supervision Stair Management: One rail Right, One rail Left, Step to pattern, Alternating pattern, Forwards Number of Stairs: 10 General stair comments: supervision for safety, greater need for supervision descending stairs. Pt did alternating to step to gait pattern ascending and step to gait pattern descending.     Balance Overall balance assessment: Mild deficits observed, not formally tested         Pertinent Vitals/Pain Pain Assessment Pain Assessment: No/denies pain (pt reports no significant pain at this time.)    Home Living Family/patient expects to be discharged to:: Private residence Living Arrangements: Spouse/significant other Available Help at Discharge: Family;Available 24 hours/day Type of Home: House Home Access: Stairs to enter Entrance Stairs-Rails: Right;Left Entrance Stairs-Number of Steps: 2 Alternate Level Stairs-Number of Steps: flight Home Layout: Two level;Able to live on main level with bedroom/bathroom;Full bath on main level Home Equipment: None      Prior Function Prior Level of Function : Independent/Modified Independent;Driving             Mobility Comments: ambulates without an AD ADLs Comments: ind with ADL's and IADLs.     Extremity/Trunk Assessment   Upper Extremity Assessment Upper Extremity Assessment: Overall WFL for tasks assessed    Lower Extremity Assessment Lower Extremity Assessment: Overall WFL for  tasks assessed    Cervical / Trunk Assessment Cervical / Trunk  Assessment: Kyphotic  Communication   Communication Communication: No apparent difficulties    Cognition Arousal: Alert Behavior During Therapy: WFL for tasks assessed/performed   PT - Cognitive impairments: No apparent impairments   Following commands: Intact       Cueing Cueing Techniques: Verbal cues     General Comments General comments (skin integrity, edema, etc.): no notable skin issues, no signs/symptoms of cardiac/respiratory distress during session. Spouse present during session        Assessment/Plan    PT Assessment Patient does not need any further PT services         PT Goals (Current goals can be found in the Care Plan section)  Acute Rehab PT Goals PT Goal Formulation: All assessment and education complete, DC therapy     AM-PAC PT 6 Clicks Mobility  Outcome Measure Help needed turning from your back to your side while in a flat bed without using bedrails?: None Help needed moving from lying on your back to sitting on the side of a flat bed without using bedrails?: None Help needed moving to and from a bed to a chair (including a wheelchair)?: None Help needed standing up from a chair using your arms (e.g., wheelchair or bedside chair)?: None Help needed to walk in hospital room?: None Help needed climbing 3-5 steps with a railing? : A Little 6 Click Score: 23    End of Session Equipment Utilized During Treatment: Gait belt Activity Tolerance: Patient tolerated treatment well Patient left: in bed;with call bell/phone within reach;with family/visitor present Nurse Communication: Mobility status      Time: 8540-8487 PT Time Calculation (min) (ACUTE ONLY): 13 min   Charges:   PT Evaluation $PT Eval Low Complexity: 1 Low   PT General Charges $$ ACUTE PT VISIT: 1 Visit        Dorothyann Maier, DPT, CLT  Acute Rehabilitation Services Office: [phone] (Secure chat preferred)   Dorothyann VEAR Maier 12/16/2023, 3:40 PM

## 2023-12-16 NOTE — Progress Notes (Signed)
 Overhead paged that chair alarm was going off.  Dr. Jonel at bedside along with wife requesting that alarms not be set

## 2023-12-17 ENCOUNTER — Inpatient Hospital Stay (HOSPITAL_COMMUNITY)

## 2023-12-17 DIAGNOSIS — N179 Acute kidney failure, unspecified: Secondary | ICD-10-CM | POA: Diagnosis not present

## 2023-12-17 LAB — CBC WITH DIFFERENTIAL/PLATELET
Abs Immature Granulocytes: 0.03 K/uL (ref 0.00–0.07)
Basophils Absolute: 0 K/uL (ref 0.0–0.1)
Basophils Relative: 1 %
Eosinophils Absolute: 0.1 K/uL (ref 0.0–0.5)
Eosinophils Relative: 2 %
HCT: 24.7 % — ABNORMAL LOW (ref 39.0–52.0)
Hemoglobin: 7.9 g/dL — ABNORMAL LOW (ref 13.0–17.0)
Immature Granulocytes: 1 %
Lymphocytes Relative: 20 %
Lymphs Abs: 0.8 K/uL (ref 0.7–4.0)
MCH: 28.2 pg (ref 26.0–34.0)
MCHC: 32 g/dL (ref 30.0–36.0)
MCV: 88.2 fL (ref 80.0–100.0)
Monocytes Absolute: 0.4 K/uL (ref 0.1–1.0)
Monocytes Relative: 9 %
Neutro Abs: 2.9 K/uL (ref 1.7–7.7)
Neutrophils Relative %: 67 %
Platelets: 123 K/uL — ABNORMAL LOW (ref 150–400)
RBC: 2.8 MIL/uL — ABNORMAL LOW (ref 4.22–5.81)
RDW: 18.5 % — ABNORMAL HIGH (ref 11.5–15.5)
WBC: 4.2 K/uL (ref 4.0–10.5)
nRBC: 0 % (ref 0.0–0.2)

## 2023-12-17 LAB — TYPE AND SCREEN
ABO/RH(D): A NEG
Antibody Screen: NEGATIVE
Unit division: 0
Unit division: 0
Unit division: 0

## 2023-12-17 LAB — PROTEIN ELECTROPHORESIS, SERUM
A/G Ratio: 1.1 (ref 0.7–1.7)
A/G Ratio: 1.1 (ref 0.7–1.7)
Albumin ELP: 3 g/dL (ref 2.9–4.4)
Albumin ELP: 3.1 g/dL (ref 2.9–4.4)
Alpha-1-Globulin: 0.3 g/dL (ref 0.0–0.4)
Alpha-1-Globulin: 0.4 g/dL (ref 0.0–0.4)
Alpha-2-Globulin: 0.6 g/dL (ref 0.4–1.0)
Alpha-2-Globulin: 0.5 g/dL (ref 0.4–1.0)
Beta Globulin: 0.9 g/dL (ref 0.7–1.3)
Beta Globulin: 0.9 g/dL (ref 0.7–1.3)
Gamma Globulin: 0.9 g/dL (ref 0.4–1.8)
Gamma Globulin: 0.9 g/dL (ref 0.4–1.8)
Globulin, Total: 2.8 g/dL (ref 2.2–3.9)
Globulin, Total: 2.7 g/dL (ref 2.2–3.9)
Total Protein ELP: 5.8 g/dL — ABNORMAL LOW (ref 6.0–8.5)
Total Protein ELP: 5.8 g/dL — ABNORMAL LOW (ref 6.0–8.5)

## 2023-12-17 LAB — BPAM RBC
Blood Product Expiration Date: 202511012359
Blood Product Expiration Date: 202510292359
Blood Product Expiration Date: 202510312359
ISSUE DATE / TIME: 202510201113
ISSUE DATE / TIME: 202510180749
ISSUE DATE / TIME: 202510181324
Unit Type and Rh: 600
Unit Type and Rh: 600
Unit Type and Rh: 600

## 2023-12-17 LAB — GLUCOSE, CAPILLARY: Glucose-Capillary: 89 mg/dL (ref 70–99)

## 2023-12-17 LAB — RENAL FUNCTION PANEL
Albumin: 2.8 g/dL — ABNORMAL LOW (ref 3.5–5.0)
Anion gap: 8 (ref 5–15)
BUN: 32 mg/dL — ABNORMAL HIGH (ref 8–23)
CO2: 26 mmol/L (ref 22–32)
Calcium: 7.5 mg/dL — ABNORMAL LOW (ref 8.9–10.3)
Chloride: 111 mmol/L (ref 98–111)
Creatinine, Ser: 2.47 mg/dL — ABNORMAL HIGH (ref 0.61–1.24)
GFR, Estimated: 26 mL/min — ABNORMAL LOW (ref >60)
Glucose, Bld: 103 mg/dL — ABNORMAL HIGH (ref 70–99)
Phosphorus: 3.5 mg/dL (ref 2.5–4.6)
Potassium: 3.5 mmol/L (ref 3.5–5.1)
Sodium: 145 mmol/L (ref 135–145)

## 2023-12-17 LAB — CK: Total CK: 238 U/L (ref 49–397)

## 2023-12-17 LAB — HAPTOGLOBIN: Haptoglobin: <10 mg/dL — ABNORMAL LOW (ref 34–355)

## 2023-12-17 MED ORDER — LIDOCAINE-EPINEPHRINE 1 %-1:100000 IJ SOLN
INTRAMUSCULAR | Status: AC
  Filled 2023-12-17: qty 1

## 2023-12-17 MED ORDER — MIDAZOLAM HCL (PF) 2 MG/2ML IJ SOLN
INTRAMUSCULAR | Status: AC | PRN
  Administered 2023-12-17: 1 mg via INTRAVENOUS
  Administered 2023-12-17: .5 mg via INTRAVENOUS

## 2023-12-17 MED ORDER — FENTANYL CITRATE (PF) 100 MCG/2ML IJ SOLN
INTRAMUSCULAR | Status: AC | PRN
  Administered 2023-12-17: 50 ug via INTRAVENOUS

## 2023-12-17 MED ORDER — FENTANYL CITRATE (PF) 100 MCG/2ML IJ SOLN
INTRAMUSCULAR | Status: AC
  Filled 2023-12-17: qty 2

## 2023-12-17 MED ORDER — MIDAZOLAM HCL 2 MG/2ML IJ SOLN
INTRAMUSCULAR | Status: AC
  Filled 2023-12-17: qty 2

## 2023-12-17 MED ORDER — LIDOCAINE HCL 1 % IJ SOLN
20.0000 mL | Freq: Once | INTRAMUSCULAR | Status: AC
  Administered 2023-12-17: 10 mL
  Filled 2023-12-17: qty 20

## 2023-12-17 NOTE — Procedures (Signed)
 Interventional Radiology Procedure Note  Procedure: Fluoro  Guided Biopsy of bone marrow  Complications: None  Estimated Blood Loss: < 10 mL  Findings: 13 G core biopsy of left iliac bone performed under fluoro guidance.  Aspirate and core samples obtained and sent to Pathology.  Cordella DELENA Banner, MD

## 2023-12-17 NOTE — Plan of Care (Signed)
   Problem: Education: Goal: Knowledge of General Education information will improve Description Including pain rating scale, medication(s)/side effects and non-pharmacologic comfort measures Outcome: Progressing

## 2023-12-17 NOTE — Progress Notes (Signed)
 DISCHARGE NOTE HOME Kenneth Lawson to be discharged Home per MD order. Discussed prescriptions and follow up appointments with the patient. Prescriptions given to patient; medication list explained in detail. Patient verbalized understanding.  Skin clean, dry and intact without evidence of skin break down, no evidence of skin tears noted. IV catheter discontinued intact. Site without signs and symptoms of complications. Dressing and pressure applied. Pt denies pain at the site currently. No complaints noted.  Patient free of lines, drains, and wounds.   An After Visit Summary (AVS) was printed and given to the patient. Patient escorted via wheelchair, and discharged home via private auto.  Peyton SHAUNNA Pepper, RN

## 2023-12-17 NOTE — Progress Notes (Signed)
 [facility] Progress Note    Assessment/ Plan:   AKI on CKD3a -baseline Cr around 1.4. Suspect AKI is secondary to severe anemia, prerenal -Peak Cr 6.75--continues to improve, down to 2.47 which is encouraging. No need for dialysis/renal biopsy at this time. SPEP and FLC acceptable. Rest of serologies negative. -encouraged PO hydration. Arranging for hospital follow up appt with us  in the next week or so. Okay for discharge from a nephrology perspective -Avoid nephrotoxic medications including NSAIDs and iodinated intravenous contrast exposure unless the latter is absolutely indicated.  Preferred narcotic agents for pain control are hydromorphone, fentanyl, and methadone. Morphine should not be used. Avoid Baclofen and avoid oral sodium phosphate and magnesium citrate based laxatives / bowel preps. Continue strict Input and Output monitoring. Will monitor the patient closely with you and intervene or adjust therapy as indicated by changes in clinical status/labs   Anemia Bicytopenia -hematology following, s/p BM biopsy -transfuse PRN for Hgb <7   Metabolic acidosis -resolved--CO2 WNL, off bicarb gtt  DM2 -mgmt per primary service  Complex right renal cyst -will need urology referral on discharge. Found on renal ultrasound  Subjective:   Patient seen and examined chairside. Wife in room. S/p bone marrow biopsy. No complaints   Objective:   BP (!) 153/69   Pulse 73   Temp 98.2 F (36.8 C) (Oral)   Resp 17   Ht 5' 8 (1.727 m)   Wt 76.4 kg   SpO2 94%   BMI 25.61 kg/m   Intake/Output Summary (Last 24 hours) at 12/17/2023 1057 Last data filed at 12/16/2023 2030 Gross per 24 hour  Intake 843.25 ml  Output 200 ml  Net 643.25 ml   Weight change: -1.4 kg  Physical Exam: Gen: NAD, laying flat in bed CVS: RRR Resp: normal wob/unlabored Abd: soft Ext: no sig edema b/l Les Neuro: awake, alert  Imaging: IR BONE MARROW BIOPSY & ASPIRATION Result Date:  12/17/2023 CLINICAL DATA:  Pancytopenia. EXAM: Fluoro guided bone marrow biopsy TECHNIQUE: Fluoro pelvis CONTRAST:  None RADIOPHARMACEUTICALS:  None FLUOROSCOPY: 1 mGy COMPARISON:  None FINDINGS: The patient was placed in prone position on the IR table. Radiopaque markers were placed on the patient's skin and initial imaging of the pelvis was performed. The patient's skin was then prepped and draped in the usual sterile fashion. Moderate sedation was provided for by the nursing staff under my supervision utilizing intravenous Versed and fentanyl. The nurse had no other duties other than monitoring the patient and providing sedation during the procedure. I was present for the entire procedure. 1.5 mg intravenous Versed and 50 mcg intravenous fentanyl for a total sedation time of 8 minutes. 1% lidocaine  was used to infiltrate the skin at the access site prior to a stab incision. Local anesthesia was then used to infiltrate the region of soft tissue from the skin to the left iliac bone. The bone marrow needle was then advanced and imaging demonstrated the needle tip to be in the cortex of the left iliac bone. The bone was then penetrated and a sample was obtained. After the sample was evaluated, approximately 5 mL of heparinized bone marrow sample was obtained by aspiration. A core sample was then obtained. Multiple attempts at sampling was performed in order to get a 2 cm segment. All needles were then removed from the patient. Sterile dressing was applied. IMPRESSION: Satisfactory core needle biopsy and aspiration of the left iliac bone marrow under fluoro guidance. Electronically Signed   By: Cordella Banner  On: 12/17/2023 10:48    Labs: BMET Recent Labs  Lab 12/13/23 2025 12/14/23 0620 12/15/23 1108 12/16/23 0741 12/17/23 0321  NA 137 140 142 148* 145  K 4.3 4.1 3.6 3.9 3.5  CL 103 108 112* 114* 111  CO2 22 21* 21* 25 26  GLUCOSE 155* 129* 101* 78 103*  BUN 74* 72* 54* 38* 32*  CREATININE  6.76* 6.25* 4.23* 3.01* 2.47*  CALCIUM  9.0 8.4* 7.5* 7.4* 7.5*  PHOS  --   --   --  3.2 3.5   CBC Recent Labs  Lab 12/13/23 2025 12/14/23 0547 12/14/23 2032 12/15/23 0818 12/16/23 0741 12/17/23 0321  WBC 6.8   < > 7.6 6.3 4.9 4.2  NEUTROABS 5.7  --  6.5  --   --  2.9  HGB 6.3*   < > 7.5* 7.2* 6.4* 7.9*  HCT 20.1*   < > 23.5* 22.6* 19.8* 24.7*  MCV 93.1   < > 91.4 92.2 91.7 88.2  PLT 137*   < > 128* 145* 120* 123*   < > = values in this interval not displayed.    Medications:     sodium chloride   Intravenous Once   alfuzosin  10 mg Oral QHS   buPROPion  300 mg Oral Daily   busPIRone  10 mg Oral BID   Eluxadoline  75 mg Oral BID   finasteride  5 mg Oral Daily   Vilazodone HCl  20 mg Oral QHS      Ephriam Stank, MD [facility] 12/17/2023, 10:57 AM

## 2023-12-17 NOTE — Discharge Summary (Signed)
 Physician Discharge Summary   Patient: Kenneth Lawson MRN: [id number] DOB: 1945/11/10  Admit date:     12/13/2023  Discharge date: 12/17/23  Discharge Physician: [name]   PCP: [name], MD     Recommendations at discharge:  Follow up with Nephrology as directed for AKI Follow up with Oncology Dr. Loretha on Friday Morna Cornett/Oncology: Please check BMP and CBC on Friday Follow up with PCP Dr. Leonel in 1 week Dr. Leonel: Please resume diabetes medications if appropriate Please refer to Urology for complex right renal cyst     Discharge Diagnoses: Principal Problem:   AKI (acute kidney injury) Active Problems:   Symptomatic anemia   Hypoxemia   Thrombocytopenia   Type 2 diabetes mellitus [facility])      Hospital Course: 78 y.o. M with DM, BPH, mood disorder, HTN and anemia who presents with pallor, fatigue insidious onset over few weeks while traveling in [place], found to have Hgb 7 g/dL and Cr 6.2 mg/dL.  Admitted for further work up.        Acute on chronic anemia Mild thrombocytopenia Was evaluated by Hematology 9 months ago for progressive mild pancytopenia (WBC 3.1, Hgb 9, Plts 125), but folate, SPEP, flow cytometry, and ANA were normal.  His white count and platelets at the time of hematology evaluation had resolved by themselves, B12 was slightly low, TSH slightly high, and kappa free light chains was only minimally elevated, so he was started on B12 supplementation and BMB was not pursued.  Over the next 6 months, he was asymptomatic, had some slight improvement in all indices, and so BMB was deferred in favor of continued surveillance.  Hgb here 6.2 g/dL on presentation.  FOBT negative, no report of GI bleeding.  Haptoglobin low but T. bili normal, LDH only marginally elevated, smear showed no schistocytes, and reticulocytes inappropriately low.  RI 0.5.    Iron and B12 replete.  SPEP showed no M spike, FLC's both elevated consistent  with AKI.  Hepatitis serologies negative, ASO only marginally elevated, ANCA, ANA, complements, and GBM negative.  BMB completed in the hospital, transfused in the hospital, Hgb up to 7.9 at discharge.  Plan for outpatient follow up in 3 days    Acute renal failure Previous baseline 1.3 within the last few months but 6.7 on admission.  FENa 3%.  PC ratio low, UA acellular.  Renal US  ruled out hydronephrosis.  No NSAIDs, no real nephrotoxins in his med list.  Not on PPI.  Given fluid boluses and creatinine improved markedly.  2.47 at discharge.  Suspect this was ischemic injury in setting of severe anemia.  Has follow up with Nephrology pending.     Right renal cyst Noted incidentally on renal US . - Recommend non-urgent Urology referral   Type 2 diabetes Glucoses controlled here and in fact had some hypoglycemia in setting of AKI.  Glimepiride , alogliptin /pioglitazone , semaglutide  held due to hypoglycemia.  Recommend monitoring at home and resumption by PCP when appropriate.              The [place]  Controlled Substances Registry was reviewed for this patient prior to discharge.  Consultants: Nephrology Hematology  Procedures performed: Bone marrow biopsy  Disposition: Home Diet recommendation:  Regular diet  DISCHARGE MEDICATION: Allergies as of 12/17/2023       Reactions   Metformin Diarrhea, Other (See Comments)        Medication List     PAUSE taking these medications    Alogliptin -Pioglitazone  25-30  MG Tabs Wait to take this until your doctor or other care provider tells you to start again. Take 1 tablet by mouth daily. What changed: additional instructions   gabapentin 300 MG capsule Wait to take this until your doctor or other care provider tells you to start again. Commonly known as: NEURONTIN Take 300 mg by mouth See admin instructions. 1 CAPSULE THREE TIMES DAILY WITH ONE EXTRA AT NIGHT AS NEEDED. ORALLY ONCE A DAY   glimepiride  4  MG tablet Wait to take this until your doctor or other care provider tells you to start again. Commonly known as: AMARYL  TAKE 1 TABLET AT BREAKFAST AND TAKE 1/2  AT LUNCH IF EATING A MEAL, OTHERWISE SKIP What changed:  how much to take how to take this when to take this   Rybelsus  7 MG Tabs Wait to take this until your doctor or other care provider tells you to start again. Generic drug: Semaglutide  TAKE 1 TABLET DAILY BEFORE BREAKFAST WITH HALF GLASS  OF WATER. What changed:  how much to take how to take this when to take this       TAKE these medications    Accu-Chek Guide test strip Generic drug: glucose blood USE TO CHECK BLOOD SUGARS THREE TIMES DAILY.   Accu-Chek Softclix Lancets lancets 3 times daily As directed DXE11.65   alfuzosin 10 MG 24 hr tablet Commonly known as: UROXATRAL Take 10 mg by mouth daily.   ALPRAZolam 0.5 MG tablet Commonly known as: XANAX Take 0.5 mg by mouth every other day as needed for anxiety. Take 1/2 tablet by mouth every other day as needed.   atorvastatin  80 MG tablet Commonly known as: LIPITOR Take 0.5 tablets (40 mg total) by mouth daily.   BD Disp Needle 23G X 1 Misc Generic drug: NEEDLE (DISP) 23 G Brand substitution OK   buPROPion 300 MG 24 hr tablet Commonly known as: WELLBUTRIN XL Take 300 mg by mouth daily.   busPIRone 10 MG tablet Commonly known as: BUSPAR Take 5-10 mg by mouth 2 (two) times daily as needed (Anxiety). TAKE 1/2 TO 1 TABLET TWICE A DAY AS NEEDED FOR ANXIETY   cyanocobalamin  1000 MCG/ML injection Commonly known as: VITAMIN B12 Inject 1 mL (1,000 mcg total) into the muscle once a week.   finasteride 5 MG tablet Commonly known as: PROSCAR Take 5 mg by mouth daily.   mometasone 50 MCG/ACT nasal spray Commonly known as: NASONEX Place 2 sprays into the nose daily as needed (Sinus).   tadalafil 5 MG tablet Commonly known as: CIALIS Take 5 mg by mouth daily as needed for erectile dysfunction.    traMADol 50 MG tablet Commonly known as: ULTRAM Take 50 mg by mouth 3 (three) times daily as needed for moderate pain (pain score 4-6).   Viberzi 75 MG Tabs Generic drug: Eluxadoline Take 75 mg by mouth 2 (two) times daily.   Vilazodone HCl 20 MG Tabs Take 20 mg by mouth daily.        Follow-up Information     [name], MD. Schedule an appointment as soon as possible for a visit in 1 week(s).   Specialty: Family Medicine Contact information: [address] [place] [address] [phone]         Loretha Ash, MD Follow up.   Specialty: Hematology and Oncology Contact information: [address] [place] [address] [phone]         Dennise Hoes, MD. Schedule an appointment as soon  as possible for a visit.   Specialty: Nephrology Contact information: [address] [place] [address] [phone]                 Discharge Instructions     Discharge instructions   Complete by: As directed    **IMPORTANT DISCHARGE INSTRUCTIONS**   From Dr. Jonel: You were admitted for anemia and renal failure  Our *speculation* at this time, is that the anemia came first, and this precipitated the kidney failure (bad anemia leads to poor oxygen delivery to organs, and if combined with dehydration perhaps, led to kidney failure)  Thankfully, with fluids here, your kidney function improved  Drink LOTS of fluids the next few days  Go see Morna Shipper at Dr, Walter office on Friday and get labs checked  Go see Dr. Leonel next week  Call [facility] for a Nephrology appointment with Dr. Dennise if you haven't heard from the by next week  Because your sugars might remain low for  a little while, hold your three diabetes medicines Check your sugar daily or twice daily and record it for Dr. Leonel, he can let you know if you can resume them  Same for hte gabapentin, until we see where your kidney function levels out, only take 300  mg once nightly Ask Dr. Leonel when/if you can increase the dose (if needed)  Lastly, your kidney has a cyst.  This should be evaluated by a Urologist.  I have asked Dr. Leonel to help you get this referral, non-urgent   Increase activity slowly   Complete by: As directed    No wound care   Complete by: As directed        Discharge Exam: Filed Weights   12/15/23 0500 12/16/23 0428 12/17/23 0247  Weight: 79 kg 77.8 kg 76.4 kg    General: Pt is alert, awake, not in acute distress Cardiovascular: RRR, nl S1-S2, no murmurs appreciated.   No LE edema.   Respiratory: Normal respiratory rate and rhythm.  CTAB without rales or wheezes. Abdominal: Abdomen soft and non-tender.  No distension or HSM.   Neuro/Psych: Strength symmetric in upper and lower extremities.  Judgment and insight appear normal.   Condition at discharge: fair  The results of significant diagnostics from this hospitalization (including imaging, microbiology, ancillary and laboratory) are listed below for reference.   Imaging Studies: IR BONE MARROW BIOPSY & ASPIRATION Result Date: 12/17/2023 CLINICAL DATA:  Pancytopenia. EXAM: Fluoro guided bone marrow biopsy TECHNIQUE: Fluoro pelvis CONTRAST:  None RADIOPHARMACEUTICALS:  None FLUOROSCOPY: 1 mGy COMPARISON:  None FINDINGS: The patient was placed in prone position on the IR table. Radiopaque markers were placed on the patient's skin and initial imaging of the pelvis was performed. The patient's skin was then prepped and draped in the usual sterile fashion. Moderate sedation was provided for by the nursing staff under my supervision utilizing intravenous Versed and fentanyl. The nurse had no other duties other than monitoring the patient and providing sedation during the procedure. I was present for the entire procedure. 1.5 mg intravenous Versed and 50 mcg intravenous fentanyl for a total sedation time of 8 minutes. 1% lidocaine  was used to infiltrate the skin at the access  site prior to a stab incision. Local anesthesia was then used to infiltrate the region of soft tissue from the skin to the left iliac bone. The bone marrow needle was then advanced and imaging demonstrated the needle tip to be in the cortex of the left  iliac bone. The bone was then penetrated and a sample was obtained. After the sample was evaluated, approximately 5 mL of heparinized bone marrow sample was obtained by aspiration. A core sample was then obtained. Multiple attempts at sampling was performed in order to get a 2 cm segment. All needles were then removed from the patient. Sterile dressing was applied. IMPRESSION: Satisfactory core needle biopsy and aspiration of the left iliac bone marrow under fluoro guidance. Electronically Signed   By: Cordella Banner   On: 12/17/2023 10:48   US  RENAL Result Date: 12/14/2023 EXAM: US  Retroperitoneum Complete, Renal. CLINICAL HISTORY: AKI (acute kidney injury) 409830. TECHNIQUE: Real-time ultrasound of the retroperitoneum (complete) with image documentation. COMPARISON: CT of the abdomen and pelvis dated 02/10/2022. FINDINGS: RIGHT KIDNEY: The right kidney is normal in size and echogenicity, measuring 11.9 x 5.9 x 4.2 cm, within an estimated volume of 152 ml. There is a complex cystic lesion again seen arising from the lower pole, measuring approximately 3.6 x 3.1 x 3.4 cm (previously measured 3.2 x 3.0 x 3.4 cm). There is also a 3.5 mm nonobstructing calculus now seen within the superior pole. No hydronephrosis. LEFT KIDNEY: The left kidney is normal in size and echogenicity, measuring 11.4 x 5.5 x 4.2 cm, with an estimated volume of 138 ml. The left kidney remains unremarkable. No hydronephrosis, renal stone, or mass visualized. BLADDER: Unremarkable as visualized. IMPRESSION: 1. Complex cystic lesion in the lower pole of the right kidney, measuring approximately 3.6 x 3.1 x 3.4 cm, increased in size compared to the prior CT dated 02/10/2022. 2. 3.5 mm  nonobstructing calculus in the superior pole of the right kidney. 3. No hydronephrosis. Electronically signed by: Evalene Coho MD 12/14/2023 06:47 AM EDT RP Workstation: [id number]   DG CHEST PORT 1 VIEW Result Date: 12/14/2023 CLINICAL DATA:  Hypoxia EXAM: PORTABLE CHEST 1 VIEW COMPARISON:  03/22/2023 FINDINGS: Streaky opacity at the left base suggest atelectasis although component of pneumonia is not excluded. Right lung clear. Cardiopericardial silhouette is at upper limits of normal for size. No acute bony abnormality. Telemetry leads overlie the chest. IMPRESSION: Streaky opacity at the left base suggests atelectasis although a component of pneumonia is not excluded. Electronically Signed   By: Camellia Candle M.D.   On: 12/14/2023 05:09    Microbiology: Results for orders placed or performed during the hospital encounter of 11/02/09  Urine culture     Status: None   Collection Time: 11/02/09  9:21 PM   Specimen: Urine, Catheterized  Result Value Ref Range Status   Specimen Description URINE, CATHETERIZED  Final   Special Requests NONE  Final   Culture  Setup Time [id number]  Final   Colony Count NO GROWTH  Final   Culture NO GROWTH  Final   Report Status 11/04/2009 FINAL  Final    Labs: CBC: Recent Labs  Lab 12/13/23 2025 12/14/23 0547 12/14/23 1146 12/14/23 2032 12/15/23 0818 12/16/23 0741 12/17/23 0321  WBC 6.8 6.4  --  7.6 6.3 4.9 4.2  NEUTROABS 5.7  --   --  6.5  --   --  2.9  HGB 6.3* 6.2* 6.7* 7.5* 7.2* 6.4* 7.9*  HCT 20.1* 19.9* 21.1* 23.5* 22.6* 19.8* 24.7*  MCV 93.1 94.3  --  91.4 92.2 91.7 88.2  PLT 137* 145*  --  128* 145* 120* 123*   Basic Metabolic Panel: Recent Labs  Lab 12/13/23 2025 12/14/23 0620 12/15/23 1108 12/16/23 0741 12/17/23 0321  NA 137 140 142 148* 145  K 4.3 4.1 3.6 3.9 3.5  CL 103 108 112* 114* 111  CO2 22 21* 21* 25 26  GLUCOSE 155* 129* 101* 78 103*  BUN 74* 72* 54* 38* 32*  CREATININE 6.76* 6.25* 4.23* 3.01* 2.47*  CALCIUM   9.0 8.4* 7.5* 7.4* 7.5*  MG 1.9  --   --   --   --   PHOS  --   --   --  3.2 3.5   Liver Function Tests: Recent Labs  Lab 12/13/23 2025 12/15/23 1108 12/16/23 0741 12/17/23 0321  AST 24 19  --   --   ALT 31 26  --   --   ALKPHOS 79 48  --   --   BILITOT 0.8 1.4*  --   --   PROT 6.7 5.3*  --   --   ALBUMIN 4.0 2.7* 2.6* 2.8*   CBG: Recent Labs  Lab 12/16/23 0932 12/16/23 1212 12/16/23 1715 12/16/23 2156 12/17/23 1101  GLUCAP 88 161* 76 143* 89    Discharge time spent: approximately 45 minutes spent on discharge counseling, evaluation of patient on day of discharge, and coordination of discharge planning with nursing, social work, pharmacy and case management  Signed: [name], MD [facility] 12/17/2023

## 2023-12-20 ENCOUNTER — Inpatient Hospital Stay: Attending: Adult Health | Admitting: Adult Health

## 2023-12-20 ENCOUNTER — Encounter: Payer: Self-pay | Admitting: Adult Health

## 2023-12-20 ENCOUNTER — Inpatient Hospital Stay

## 2023-12-20 VITALS — BP 133/53 | HR 71 | Temp 98.2°F | Resp 16 | Ht 68.0 in | Wt 169.9 lb

## 2023-12-20 DIAGNOSIS — D649 Anemia, unspecified: Secondary | ICD-10-CM | POA: Diagnosis present

## 2023-12-20 DIAGNOSIS — E7800 Pure hypercholesterolemia, unspecified: Secondary | ICD-10-CM | POA: Insufficient documentation

## 2023-12-20 DIAGNOSIS — D61818 Other pancytopenia: Secondary | ICD-10-CM

## 2023-12-20 DIAGNOSIS — R7309 Other abnormal glucose: Secondary | ICD-10-CM | POA: Diagnosis not present

## 2023-12-20 DIAGNOSIS — N289 Disorder of kidney and ureter, unspecified: Secondary | ICD-10-CM | POA: Diagnosis not present

## 2023-12-20 LAB — CBC WITH DIFFERENTIAL (CANCER CENTER ONLY)
Abs Immature Granulocytes: 0.06 K/uL (ref 0.00–0.07)
Basophils Absolute: 0.1 K/uL (ref 0.0–0.1)
Basophils Relative: 1 %
Eosinophils Absolute: 0.1 K/uL (ref 0.0–0.5)
Eosinophils Relative: 2 %
HCT: 23.3 % — ABNORMAL LOW (ref 39.0–52.0)
Hemoglobin: 7.5 g/dL — ABNORMAL LOW (ref 13.0–17.0)
Immature Granulocytes: 1 %
Lymphocytes Relative: 10 %
Lymphs Abs: 0.7 K/uL (ref 0.7–4.0)
MCH: 28.6 pg (ref 26.0–34.0)
MCHC: 32.2 g/dL (ref 30.0–36.0)
MCV: 88.9 fL (ref 80.0–100.0)
Monocytes Absolute: 0.4 K/uL (ref 0.1–1.0)
Monocytes Relative: 6 %
Neutro Abs: 5.4 K/uL (ref 1.7–7.7)
Neutrophils Relative %: 80 %
Platelet Count: 137 K/uL — ABNORMAL LOW (ref 150–400)
RBC: 2.62 MIL/uL — ABNORMAL LOW (ref 4.22–5.81)
RDW: 16.7 % — ABNORMAL HIGH (ref 11.5–15.5)
WBC Count: 6.7 K/uL (ref 4.0–10.5)
nRBC: 0 % (ref 0.0–0.2)

## 2023-12-20 LAB — CMP (CANCER CENTER ONLY)
ALT: 17 U/L (ref 0–44)
AST: 15 U/L (ref 15–41)
Albumin: 3.8 g/dL (ref 3.5–5.0)
Alkaline Phosphatase: 63 U/L (ref 38–126)
Anion gap: 6 (ref 5–15)
BUN: 25 mg/dL — ABNORMAL HIGH (ref 8–23)
CO2: 27 mmol/L (ref 22–32)
Calcium: 8.2 mg/dL — ABNORMAL LOW (ref 8.9–10.3)
Chloride: 114 mmol/L — ABNORMAL HIGH (ref 98–111)
Creatinine: 2.07 mg/dL — ABNORMAL HIGH (ref 0.61–1.24)
GFR, Estimated: 33 mL/min — ABNORMAL LOW (ref >60)
Glucose, Bld: 161 mg/dL — ABNORMAL HIGH (ref 70–99)
Potassium: 4.2 mmol/L (ref 3.5–5.1)
Sodium: 147 mmol/L — ABNORMAL HIGH (ref 135–145)
Total Bilirubin: 0.9 mg/dL (ref 0.0–1.2)
Total Protein: 6.4 g/dL — ABNORMAL LOW (ref 6.5–8.1)

## 2023-12-20 LAB — TECHNOLOGIST SMEAR REVIEW: Plt Morphology: NORMAL

## 2023-12-20 LAB — LACTATE DEHYDROGENASE: LDH: 293 U/L — ABNORMAL HIGH (ref 98–192)

## 2023-12-20 LAB — TSH: TSH: 4.17 u[IU]/mL (ref 0.350–4.500)

## 2023-12-20 LAB — VITAMIN B12: Vitamin B-12: 846 pg/mL (ref 180–914)

## 2023-12-20 LAB — RETICULOCYTES
Immature Retic Fract: 13.6 % (ref 2.3–15.9)
RBC.: 2.59 MIL/uL — ABNORMAL LOW (ref 4.22–5.81)
Retic Count, Absolute: 53.4 K/uL (ref 19.0–186.0)
Retic Ct Pct: 2.1 % (ref 0.4–3.1)

## 2023-12-20 LAB — SAMPLE TO BLOOD BANK

## 2023-12-20 LAB — SURGICAL PATHOLOGY

## 2023-12-20 NOTE — Progress Notes (Signed)
 [facility] Cancer Follow up:    Kenneth Cole, MD [address] [place] [address]   DIAGNOSIS:    SUMMARY OF HEMATOLOGIC HISTORY: Copied from hospital d/c summary from 12/17/2023:   Was evaluated by Hematology 9 months ago for progressive mild pancytopenia (WBC 3.1, Hgb 9, Plts 125), but folate, SPEP, flow cytometry, and ANA were normal.  His white count and platelets at the time of hematology evaluation had resolved by themselves, B12 was slightly low, TSH slightly high, and kappa free light chains was only minimally elevated, so he was started on B12 supplementation and BMB was not pursued.  Over the next 6 months, he was asymptomatic, had some slight improvement in all indices, and so BMB was deferred in favor of continued surveillance.   Hgb here 6.2 g/dL on presentation.  FOBT negative, no report of GI bleeding.   Haptoglobin low but T. bili normal, LDH only marginally elevated, smear showed no schistocytes, and reticulocytes inappropriately low.  RI 0.5.     Iron and B12 replete.   SPEP showed no M spike, FLC's both elevated consistent with AKI.   Hepatitis serologies negative, ASO only marginally elevated, ANCA, ANA, complements, and GBM negative.   BMB completed 12/17/2023 demonstrating normocellular marrow with trilineage hematopoiesis    INTERVAL HISTORY:  Discussed the use of AI scribe software for clinical note transcription with the patient, who gave verbal consent to proceed.  History of Present Illness Kenneth Lawson is a 78 year old male with pancytopenia who presents for a follow-up after hospitalization. He was referred by Dr. Loretha for evaluation of pancytopenia.  He was recently hospitalized for pancytopenia and worsening anemia. A bone marrow biopsy on October 21 showed normal cellular marrow with trilineage hematopoiesis. Cytogenetics, haptoglobin, TSH, and B12 levels are pending. LDH remains elevated, and a  technologist smear review is also pending.  Since discharge, he feels much better. His hemoglobin level is currently 7.5, and platelets are at 137. He has not resumed his monthly vitamin B12 injections, having missed the last dose before a trip. His kidney function has improved since hospitalization.  His blood sugar was 161 today after breakfast, and he is not on diabetic medication. Kidney function is slightly elevated but improved. He often feels cold, which he attributes to long-standing anemia.  He tells me that he is feeling much better since being discharged from the hospital.       Patient Active Problem List   Diagnosis Date Noted   Hypoxemia 12/14/2023   Thrombocytopenia 12/14/2023   Type 2 diabetes mellitus (HCC) 12/14/2023   AKI (acute kidney injury) 12/13/2023   Symptomatic anemia 04/09/2023   Chronic low back pain 12/17/2017   Depression 03/21/2013   Type II or unspecified type diabetes mellitus without mention of complication, uncontrolled 09/15/2012   Pure hypercholesterolemia 09/15/2012   Benign prostatic hyperplasia 09/15/2012    is allergic to metformin.  MEDICAL HISTORY: Past Medical History:  Diagnosis Date   Arthritis    Diabetes mellitus without complication (HCC)    Hypercholesterolemia    Hypertension     SURGICAL HISTORY: Past Surgical History:  Procedure Laterality Date   IR BONE MARROW BIOPSY & ASPIRATION  12/17/2023   LAPARASCOPIC MEDICAN ARCUATE LIGAMENT RELEASE     MINOR HEMORRHOIDECTOMY N/A     SOCIAL HISTORY: Social History   Socioeconomic History   Marital status: Married    Spouse name: Not on file   Number of children: Not  on file   Years of education: Not on file   Highest education level: Not on file  Occupational History   Not on file  Tobacco Use   Smoking status: Former    Types: Cigarettes   Smokeless tobacco: Never  Vaping Use   Vaping status: Not on file  Substance and Sexual Activity   Alcohol use: Yes     Comment: rarely   Drug use: Never   Sexual activity: Not on file  Other Topics Concern   Not on file  Social History Narrative   Pt lives with wife    Retired    Social Drivers of Corporate Investment Banker Strain: Not on file  Food Insecurity: No Food Insecurity (12/14/2023)   Hunger Vital Sign    Worried About Running Out of Food in the Last Year: Never true    Ran Out of Food in the Last Year: Never true  Transportation Needs: No Transportation Needs (12/14/2023)   PRAPARE - Administrator, Civil Service (Medical): No    Lack of Transportation (Non-Medical): No  Physical Activity: Low Risk (07/18/2023)   Received from [facility]   PCARE Exercise SDOH    Exercise: Aerobic    PCare Exercise SDOH: Not on file    PCare Exercise SDOH: Not on file  Stress: Not on file  Social Connections: Moderately Integrated (12/14/2023)   Social Connection and Isolation Panel    Frequency of Communication with Friends and Family: More than three times a week    Frequency of Social Gatherings with Friends and Family: More than three times a week    Attends Religious Services: More than 4 times per year    Active Member of [employer] or Organizations: No    Attends Banker Meetings: Never    Marital Status: Married  Catering Manager Violence: Not At Risk (12/15/2023)   Humiliation, Afraid, Rape, and Kick questionnaire    Fear of Current or Ex-Partner: No    Emotionally Abused: No    Physically Abused: No    Sexually Abused: No    FAMILY HISTORY: Family History  Problem Relation Age of Onset   Alzheimer's disease Mother    Cancer Neg Hx    Parkinson's disease Neg Hx    Tremor Neg Hx     Review of Systems  Constitutional:  Positive for fatigue (mild and improving). Negative for appetite change, chills, fever and unexpected weight change.  HENT:   Negative for hearing loss, lump/mass and trouble swallowing.   Eyes:  Negative for eye problems and  icterus.  Respiratory:  Negative for chest tightness, cough and shortness of breath.   Cardiovascular:  Negative for chest pain, leg swelling and palpitations.  Gastrointestinal:  Negative for abdominal distention, abdominal pain, constipation, diarrhea, nausea and vomiting.  Endocrine: Negative for hot flashes.  Genitourinary:  Negative for difficulty urinating.   Musculoskeletal:  Negative for arthralgias.  Skin:  Negative for itching and rash.  Neurological:  Negative for dizziness, extremity weakness, headaches and numbness.  Hematological:  Negative for adenopathy. Does not bruise/bleed easily.  Psychiatric/Behavioral:  Negative for depression. The patient is not nervous/anxious.       PHYSICAL EXAMINATION    Vitals:   12/20/23 1159  BP: (!) 133/53  Pulse: 71  Resp: 16  Temp: 98.2 F (36.8 C)  SpO2: 94%    Physical Exam Constitutional:      General: He is not in acute distress.  Appearance: Normal appearance. He is ill-appearing (appears chronically ill). He is not toxic-appearing.  HENT:     Head: Normocephalic and atraumatic.     Mouth/Throat:     Mouth: Mucous membranes are moist.     Pharynx: Oropharynx is clear. No oropharyngeal exudate or posterior oropharyngeal erythema.  Eyes:     General: No scleral icterus. Cardiovascular:     Rate and Rhythm: Normal rate and regular rhythm.     Pulses: Normal pulses.     Heart sounds: Normal heart sounds.  Pulmonary:     Effort: Pulmonary effort is normal.     Breath sounds: Normal breath sounds.  Abdominal:     General: Abdomen is flat. Bowel sounds are normal. There is no distension.     Palpations: Abdomen is soft.     Tenderness: There is no abdominal tenderness.  Musculoskeletal:        General: No swelling.     Cervical back: Neck supple.  Lymphadenopathy:     Cervical: No cervical adenopathy.  Skin:    General: Skin is warm and dry.     Coloration: Skin is pale.     Findings: No rash.  Neurological:      General: No focal deficit present.     Mental Status: He is alert.  Psychiatric:        Mood and Affect: Mood normal.        Behavior: Behavior normal.     LABORATORY DATA:  CBC    Component Value Date/Time   WBC 6.7 12/20/2023 1126   WBC 4.2 12/17/2023 0321   RBC 2.62 (L) 12/20/2023 1126   RBC 2.59 (L) 12/20/2023 1126   HGB 7.5 (L) 12/20/2023 1126   HCT 23.3 (L) 12/20/2023 1126   HCT 30.8 (L) 04/09/2023 1557   PLT 137 (L) 12/20/2023 1126   MCV 88.9 12/20/2023 1126   MCH 28.6 12/20/2023 1126   MCHC 32.2 12/20/2023 1126   RDW 16.7 (H) 12/20/2023 1126   LYMPHSABS 0.7 12/20/2023 1126   MONOABS 0.4 12/20/2023 1126   EOSABS 0.1 12/20/2023 1126   BASOSABS 0.1 12/20/2023 1126    CMP     Component Value Date/Time   NA 147 (H) 12/20/2023 1126   NA 140 02/13/2019 1558   K 4.2 12/20/2023 1126   CL 114 (H) 12/20/2023 1126   CO2 27 12/20/2023 1126   GLUCOSE 161 (H) 12/20/2023 1126   BUN 25 (H) 12/20/2023 1126   BUN 19 02/13/2019 1558   CREATININE 2.07 (H) 12/20/2023 1126   CALCIUM  8.2 (L) 12/20/2023 1126   PROT 6.4 (L) 12/20/2023 1126   ALBUMIN 3.8 12/20/2023 1126   AST 15 12/20/2023 1126   ALT 17 12/20/2023 1126   ALKPHOS 63 12/20/2023 1126   BILITOT 0.9 12/20/2023 1126   GFRNONAA 33 (L) 12/20/2023 1126   GFRAA 105 02/13/2019 1558     ASSESSMENT and THERAPY PLAN:   Assessment and Plan Assessment & Plan Anemia with recent pancytopenia evaluation Bone marrow biopsy showed normal cellular marrow with trilineage hematopoiesis. No malignancy detected. Hemoglobin at 7.5, platelets at 137. LDH elevated. Pending haptoglobin, technologist smear review, TSH, and B12. - Await pending lab results. - Monitor hemoglobin and platelet levels. - Consider transfusion if hemoglobin <7 or symptoms develop. - Provided printed lab results.  Impaired kidney function, improving Kidney function improved since hospitalization.  Elevated blood glucose Blood glucose at 161  postprandial. No diabetic medication due to kidney issues. Random glucose should be <140 without  diabetes.  RTC in 1 week for f/u.     All questions were answered. The patient knows to call the clinic with any problems, questions or concerns. We can certainly see the patient much sooner if necessary.  Total encounter time:30 minutes*in face-to-face visit time, chart review, lab review, care coordination, order entry, and documentation of the encounter time.    Morna Kendall, NP 12/23/23 2:48 PM Medical Oncology and Hematology [facility] [address] [place], [place] [address] Tel. [phone]    Fax. [phone]  *Total Encounter Time as defined by the Centers for Medicare and Medicaid Services includes, in addition to the face-to-face time of a patient visit (documented in the note above) non-face-to-face time: obtaining and reviewing outside history, ordering and reviewing medications, tests or procedures, care coordination (communications with other health care professionals or caregivers) and documentation in the medical record.

## 2023-12-21 LAB — HAPTOGLOBIN: Haptoglobin: <10 mg/dL — ABNORMAL LOW (ref 34–355)

## 2023-12-25 ENCOUNTER — Encounter (HOSPITAL_COMMUNITY): Payer: Self-pay | Admitting: Oncology

## 2023-12-25 ENCOUNTER — Ambulatory Visit: Payer: Self-pay | Admitting: Hematology and Oncology

## 2023-12-26 ENCOUNTER — Other Ambulatory Visit: Payer: Self-pay

## 2023-12-26 ENCOUNTER — Telehealth: Payer: Self-pay

## 2023-12-26 NOTE — Addendum Note (Signed)
 Addended by: WONDA RAD T on: 12/26/2023 03:38 PM   Modules accepted: Orders

## 2023-12-26 NOTE — Telephone Encounter (Signed)
 Informed the patient's wife of his agreed-upon appointments at the cancer center: labs scheduled for tomorrow and blood administration on Saturday in the infusion suite.

## 2023-12-27 ENCOUNTER — Inpatient Hospital Stay

## 2023-12-27 DIAGNOSIS — D61818 Other pancytopenia: Secondary | ICD-10-CM | POA: Diagnosis not present

## 2023-12-27 DIAGNOSIS — D649 Anemia, unspecified: Secondary | ICD-10-CM

## 2023-12-27 LAB — CBC WITH DIFFERENTIAL (CANCER CENTER ONLY)
Abs Immature Granulocytes: 0.03 K/uL (ref 0.00–0.07)
Basophils Absolute: 0 K/uL (ref 0.0–0.1)
Basophils Relative: 1 %
Eosinophils Absolute: 0.1 K/uL (ref 0.0–0.5)
Eosinophils Relative: 2 %
HCT: 21 % — ABNORMAL LOW (ref 39.0–52.0)
Hemoglobin: 6.9 g/dL — CL (ref 13.0–17.0)
Immature Granulocytes: 1 %
Lymphocytes Relative: 13 %
Lymphs Abs: 0.6 K/uL — ABNORMAL LOW (ref 0.7–4.0)
MCH: 29.5 pg (ref 26.0–34.0)
MCHC: 32.9 g/dL (ref 30.0–36.0)
MCV: 89.7 fL (ref 80.0–100.0)
Monocytes Absolute: 0.3 K/uL (ref 0.1–1.0)
Monocytes Relative: 6 %
Neutro Abs: 3.7 K/uL (ref 1.7–7.7)
Neutrophils Relative %: 77 %
Platelet Count: 129 K/uL — ABNORMAL LOW (ref 150–400)
RBC: 2.34 MIL/uL — ABNORMAL LOW (ref 4.22–5.81)
RDW: 16.4 % — ABNORMAL HIGH (ref 11.5–15.5)
WBC Count: 4.8 K/uL (ref 4.0–10.5)
nRBC: 0 % (ref 0.0–0.2)

## 2023-12-27 LAB — CMP (CANCER CENTER ONLY)
ALT: 15 U/L (ref 0–44)
AST: 12 U/L — ABNORMAL LOW (ref 15–41)
Albumin: 3.7 g/dL (ref 3.5–5.0)
Alkaline Phosphatase: 62 U/L (ref 38–126)
Anion gap: 4 — ABNORMAL LOW (ref 5–15)
BUN: 32 mg/dL — ABNORMAL HIGH (ref 8–23)
CO2: 28 mmol/L (ref 22–32)
Calcium: 8.8 mg/dL — ABNORMAL LOW (ref 8.9–10.3)
Chloride: 110 mmol/L (ref 98–111)
Creatinine: 1.75 mg/dL — ABNORMAL HIGH (ref 0.61–1.24)
GFR, Estimated: 40 mL/min — ABNORMAL LOW (ref >60)
Glucose, Bld: 195 mg/dL — ABNORMAL HIGH (ref 70–99)
Potassium: 4.1 mmol/L (ref 3.5–5.1)
Sodium: 142 mmol/L (ref 135–145)
Total Bilirubin: 0.5 mg/dL (ref 0.0–1.2)
Total Protein: 6.2 g/dL — ABNORMAL LOW (ref 6.5–8.1)

## 2023-12-27 LAB — SAMPLE TO BLOOD BANK

## 2023-12-28 ENCOUNTER — Inpatient Hospital Stay: Attending: Adult Health

## 2023-12-28 DIAGNOSIS — D649 Anemia, unspecified: Secondary | ICD-10-CM | POA: Insufficient documentation

## 2023-12-28 DIAGNOSIS — D61818 Other pancytopenia: Secondary | ICD-10-CM | POA: Insufficient documentation

## 2023-12-28 LAB — PREPARE RBC (CROSSMATCH)

## 2023-12-28 MED ORDER — SODIUM CHLORIDE 0.9% IV SOLUTION
250.0000 mL | INTRAVENOUS | Status: DC
  Administered 2023-12-28: 250 mL via INTRAVENOUS

## 2023-12-28 NOTE — Patient Instructions (Signed)

## 2023-12-30 ENCOUNTER — Encounter: Payer: Self-pay | Admitting: Endocrinology

## 2023-12-30 ENCOUNTER — Other Ambulatory Visit: Payer: Self-pay

## 2023-12-30 ENCOUNTER — Ambulatory Visit: Admitting: Endocrinology

## 2023-12-30 VITALS — BP 132/82 | Ht 68.0 in | Wt 169.0 lb

## 2023-12-30 DIAGNOSIS — E119 Type 2 diabetes mellitus without complications: Secondary | ICD-10-CM

## 2023-12-30 DIAGNOSIS — C7951 Secondary malignant neoplasm of bone: Secondary | ICD-10-CM

## 2023-12-30 DIAGNOSIS — E785 Hyperlipidemia, unspecified: Secondary | ICD-10-CM | POA: Diagnosis not present

## 2023-12-30 LAB — BPAM RBC
Blood Product Expiration Date: 202512042359
Blood Product Expiration Date: 202512052359
ISSUE DATE / TIME: 202511011004
ISSUE DATE / TIME: 202511011013
Unit Type and Rh: 600
Unit Type and Rh: 600

## 2023-12-30 LAB — TYPE AND SCREEN
ABO/RH(D): A NEG
Antibody Screen: NEGATIVE
Unit division: 0
Unit division: 0

## 2023-12-30 NOTE — Progress Notes (Signed)
 Outpatient Endocrinology Note Elizandro Laura, MD  12/30/23  Patient's Name: Kenneth Lawson    DOB: Mar 26, 1945    MRN: [id number]                                                    REASON OF VISIT: Follow up of type 2 diabetes mellitus  PCP: [name], MD  HISTORY OF PRESENT ILLNESS:   Kenneth Lawson is a 78 y.o. old male with past medical history listed below, is here for follow up for type 2 diabetes mellitus.   Pertinent Diabetes History: Patient was diagnosed with type 2 diabetes mellitus in 1997.  He has controlled type 2 diabetes mellitus.  Chronic Diabetes Complications : Retinopathy: no. Last ophthalmology exam was done on annually, July 2024, following with ophthalmology regularly.  Nephropathy: no Peripheral neuropathy: no Coronary artery disease: no Stroke: no  Relevant comorbidities and cardiovascular risk factors: Obesity: no Body mass index is 25.7 kg/m.  Hypertension: Yes  Hyperlipidemia : Yes, on statin   Current / Home Diabetic regimen includes: Amaryl  / Glimepiride  2 mg as needed.  Rybelsus  7 mg daily.  Currently not taking. Alogliptin  / pioglitazone  ( Oseni ) 25-30mg  daily with dinner.  Currently not taking.  Prior diabetic medications: Metformin stopped due to GI side effects.  He had been on Invokana  /Jardiance  in the past, stopped due to balanitis.  Glycemic data:   Not able to download glucometer in the clinic.  Also not able to review glucose data from the meter. Patient reports average blood sugar in the last 30 days 140, blood sugar mostly in 100-150 range.  Denies significant hyperglycemia.  Hypoglycemia: Patient has denies hypoglycemic episodes. Patient has hypoglycemia awareness.  Factors modifying glucose control: 1.  Diabetic diet assessment: 3 meals.   2.  Staying active or exercising: walk, plays grand kids basketball, football.   3.  Medication compliance: compliant all of the time.  Interval history  Recent lab with hemoglobin  A1c 4.8%, likely falsely low due to anemia.  Patient had prolonged hospitalization due to severe anemia and AKI.  He has been following with hematology and nephrology.  Renal function has been significantly improved.  Diabetic medications are kept on hold at the time of discharge on October 21.  He has not been taking any diabetic medication.  When not on diabetic medication he reports blood sugar has been in the acceptable range denies significant hyperglycemia.  No other complaints today.  He is accompanied by wife in the clinic today.  REVIEW OF SYSTEMS As per history of present illness.   PAST MEDICAL HISTORY: Past Medical History:  Diagnosis Date   Arthritis    Diabetes mellitus without complication (HCC)    Hypercholesterolemia    Hypertension     PAST SURGICAL HISTORY: Past Surgical History:  Procedure Laterality Date   IR BONE MARROW BIOPSY & ASPIRATION  12/17/2023   LAPARASCOPIC MEDICAN ARCUATE LIGAMENT RELEASE     MINOR HEMORRHOIDECTOMY N/A     ALLERGIES: Allergies  Allergen Reactions   Metformin Diarrhea and Other (See Comments)    FAMILY HISTORY:  Family History  Problem Relation Age of Onset   Alzheimer's disease Mother    Cancer Neg Hx    Parkinson's disease Neg Hx    Tremor Neg Hx     SOCIAL HISTORY: Social History  Socioeconomic History   Marital status: Married    Spouse name: Not on file   Number of children: Not on file   Years of education: Not on file   Highest education level: Not on file  Occupational History   Not on file  Tobacco Use   Smoking status: Former    Types: Cigarettes   Smokeless tobacco: Never  Vaping Use   Vaping status: Not on file  Substance and Sexual Activity   Alcohol use: Yes    Comment: rarely   Drug use: Never   Sexual activity: Not on file  Other Topics Concern   Not on file  Social History Narrative   Pt lives with wife    Retired    Social Drivers of Corporate Investment Banker Strain: Not on file   Food Insecurity: No Food Insecurity (12/14/2023)   Hunger Vital Sign    Worried About Running Out of Food in the Last Year: Never true    Ran Out of Food in the Last Year: Never true  Transportation Needs: No Transportation Needs (12/14/2023)   PRAPARE - Administrator, Civil Service (Medical): No    Lack of Transportation (Non-Medical): No  Physical Activity: Low Risk (07/18/2023)   Received from [facility]   PCARE Exercise SDOH    Exercise: Aerobic    PCare Exercise SDOH: Not on file    PCare Exercise SDOH: Not on file  Stress: Not on file  Social Connections: Moderately Integrated (12/14/2023)   Social Connection and Isolation Panel    Frequency of Communication with Friends and Family: More than three times a week    Frequency of Social Gatherings with Friends and Family: More than three times a week    Attends Religious Services: More than 4 times per year    Active Member of [employer] or Organizations: No    Attends Engineer, Structural: Never    Marital Status: Married    MEDICATIONS:  Current Outpatient Medications  Medication Sig Dispense Refill   ACCU-CHEK GUIDE test strip USE TO CHECK BLOOD SUGARS THREE TIMES DAILY. 100 strip 2   Accu-Chek Softclix Lancets lancets 3 times daily As directed DXE11.65 200 each 3   alfuzosin (UROXATRAL) 10 MG 24 hr tablet Take 10 mg by mouth daily.     ALPRAZolam (XANAX) 0.5 MG tablet Take 0.5 mg by mouth every other day as needed for anxiety. Take 1/2 tablet by mouth every other day as needed.     atorvastatin  (LIPITOR) 80 MG tablet Take 0.5 tablets (40 mg total) by mouth daily. 90 tablet 3   buPROPion (WELLBUTRIN XL) 300 MG 24 hr tablet Take 300 mg by mouth daily.     busPIRone (BUSPAR) 10 MG tablet Take 5-10 mg by mouth 2 (two) times daily as needed (Anxiety). TAKE 1/2 TO 1 TABLET TWICE A DAY AS NEEDED FOR ANXIETY     cyanocobalamin  (VITAMIN B12) 1000 MCG/ML injection Inject 1 mL (1,000 mcg total) into the  muscle once a week. 1 mL 7   Eluxadoline (VIBERZI) 75 MG TABS Take 75 mg by mouth 2 (two) times daily.     finasteride (PROSCAR) 5 MG tablet Take 5 mg by mouth daily.     mometasone (NASONEX) 50 MCG/ACT nasal spray Place 2 sprays into the nose daily as needed (Sinus).  5   tadalafil (CIALIS) 5 MG tablet Take 5 mg by mouth daily as needed for erectile dysfunction.     [  Paused] Alogliptin -Pioglitazone  25-30 MG TABS Take 1 tablet by mouth daily. (Patient taking differently: Take 1 tablet by mouth daily. TAKE 1 TABLET BY MOUTH EVERY DAY) 90 tablet 3   [Paused] gabapentin (NEURONTIN) 300 MG capsule Take 300 mg by mouth See admin instructions. 1 CAPSULE THREE TIMES DAILY WITH ONE EXTRA AT NIGHT AS NEEDED. ORALLY ONCE A DAY     [Paused] glimepiride  (AMARYL ) 4 MG tablet TAKE 1 TABLET AT BREAKFAST AND TAKE 1/2  AT LUNCH IF EATING A MEAL, OTHERWISE SKIP (Patient taking differently: Take 4 mg by mouth See admin instructions. TAKE 1 TABLET AT BREAKFAST AND TAKE 1/2  AT LUNCH IF EATING A MEAL, OTHERWISE SKIP) 180 tablet 0   NEEDLE, DISP, 23 G (BD DISP NEEDLE) 23G X 1 MISC Brand substitution OK 8 each 0   [Paused] Semaglutide  (RYBELSUS ) 7 MG TABS TAKE 1 TABLET DAILY BEFORE BREAKFAST WITH HALF GLASS  OF WATER. (Patient taking differently: Take 7 mg by mouth daily. TAKE 1 TABLET DAILY BEFORE BREAKFAST WITH HALF GLASS  OF WATER.) 90 tablet 1   Vilazodone HCl 20 MG TABS Take 20 mg by mouth daily.     No current facility-administered medications for this visit.    PHYSICAL EXAM: Vitals:   12/30/23 1353  BP: 132/82  Weight: 169 lb (76.7 kg)  Height: 5' 8 (1.727 m)    Body mass index is 25.7 kg/m.  Wt Readings from Last 3 Encounters:  12/30/23 169 lb (76.7 kg)  12/20/23 169 lb 14.4 oz (77.1 kg)  12/17/23 168 lb 6.9 oz (76.4 kg)    General: Well developed, well nourished male in no apparent distress.  HEENT: AT/[place], no external lesions.  Eyes: Conjunctiva clear and no icterus. Neck: Neck supple  Lungs:  Respirations not labored Neurologic: Alert, oriented, normal speech Extremities / Skin: Dry.  Psychiatric: Does not appear depressed or anxious  Diabetic Foot Exam - Simple   No data filed    LABS Reviewed Lab Results  Component Value Date   HGBA1C 4.8 12/14/2023   HGBA1C 5.7 (A) 04/26/2023   HGBA1C 5.8 (A) 12/27/2022   Lab Results  Component Value Date   FRUCTOSAMINE 263 03/24/2021   FRUCTOSAMINE 276 04/18/2020   FRUCTOSAMINE 244 06/01/2019   Lab Results  Component Value Date   CHOL 160 12/27/2022   HDL 56.40 12/27/2022   LDLCALC 92 12/27/2022   LDLDIRECT 134.6 11/18/2013   TRIG 54.0 12/27/2022   CHOLHDL 3 12/27/2022   No results found for: [facility]  Lab Results  Component Value Date   CREATININE 1.75 (H) 12/27/2023   Lab Results  Component Value Date   GFR 79.29 12/27/2022    ASSESSMENT / PLAN  1. Controlled type 2 diabetes mellitus without complication, without long-term current use of insulin (HCC)     Diabetes Mellitus type 2, complicated by no known complications. - Diabetic status / severity: Controlled.  Lab Results  Component Value Date   HGBA1C 4.8 12/14/2023    - Hemoglobin A1c goal : <6.5%  Patient was hospitalized due to severe anemia and AKI.  Renal function has been improving, he has been actively following with hematology and nephrology.  Diabetic medications are currently on hold due to severe renal insufficiency.  Reportedly his blood sugar has been in the 100-150 range, denies significant hyperglycemia.  Not able to download glucometer data in the clinic today to review.  - Medications: - Continue to hold diabetic medications including glimepiride , Rybelsus , alogliptin /pioglitazone  while in the process of improving  renal insufficiency. -Discussed that as long as blood sugar fasting remained 100-150 range and bedtime <200 should be reasonable for now. -Continue to monitor without medication until renal function is reasonably  better. -Patient is asked to call our clinic with any concerning blood sugar.   - Advised to monitor blood sugar in the morning fasting, at bedtime and as needed. - Asked to message glucose log in 2 weeks to review.  - Home glucose testing: Check in the morning fasting and at bedtime and as needed. - Discussed/ Gave Hypoglycemia treatment plan.  # Consult : not required at this time.   # Annual urine for microalbuminuria/ creatinine ratio, no microalbuminuria currently.  Last  No results found for: MICRALBCREAT   # Foot check nightly.  # Annual dilated diabetic eye exams.   - Diet: Eat reasonable portion sizes to promote a healthy weight - Life style / activity / exercise: Discussed.  2. Blood pressure  -  BP Readings from Last 1 Encounters:  12/30/23 132/82    - Control is not in target. Mildly high blood pressure. - No change in current plans.  Managed by primary care provider.  3. Lipid status / Hyperlipidemia - Last  Lab Results  Component Value Date   LDLCALC 92 12/27/2022   - Continue atorvastatin  80 mg daily.    Joushua Dugar was seen today for diabetes.  Diagnoses and all orders for this visit:  Controlled type 2 diabetes mellitus without complication, without long-term current use of insulin (HCC)    DISPOSITION Follow up in clinic in 3 months suggested.   All questions answered and patient verbalized understanding of the plan.  Corayma Cashatt, MD [facility] Endocrinology [facility] [address] [place], [address] [place], [place] [address] Phone # [phone]  At least part of this note was generated using voice recognition software. Inadvertent word errors may have occurred, which were not recognized during the proofreading process.

## 2023-12-30 NOTE — Patient Instructions (Signed)
 Please message glucose log in 2 weeks.

## 2024-01-02 ENCOUNTER — Encounter (HOSPITAL_COMMUNITY): Payer: Self-pay

## 2024-01-06 ENCOUNTER — Inpatient Hospital Stay

## 2024-01-15 NOTE — Telephone Encounter (Signed)
-----   Message from Dillingham Iruku sent at 01/15/2024  8:03 AM EST ----- He needs to be seen this week or next week, ok to see me or Morna. Labs needed CBC, CMP, LDH, retic count, haptoglobin.  Thanks, ----- Message ----- From: Crawford Morna Pickle, NP Sent: 12/27/2023  10:06 AM EST To: Amber Stalls, MD  Getting 2 units of blood tomorrow ----- Message ----- From: Rebecka, Lab In [place] Sent: 12/27/2023  10:01 AM EDT To: Morna Pickle Crawford, NP

## 2024-01-15 NOTE — Telephone Encounter (Signed)
 Attempted to call pt to offer an appt with MD for this Friday. Pt did not answer. LVM for call back.

## 2024-02-04 ENCOUNTER — Other Ambulatory Visit: Payer: Self-pay

## 2024-02-04 ENCOUNTER — Telehealth: Payer: Self-pay | Admitting: Hematology and Oncology

## 2024-02-04 ENCOUNTER — Ambulatory Visit: Admitting: Hematology and Oncology

## 2024-02-04 ENCOUNTER — Inpatient Hospital Stay: Attending: Hematology and Oncology

## 2024-02-04 VITALS — BP 133/48 | HR 85 | Temp 97.7°F | Resp 17 | Wt 166.8 lb

## 2024-02-04 DIAGNOSIS — D649 Anemia, unspecified: Secondary | ICD-10-CM | POA: Insufficient documentation

## 2024-02-04 DIAGNOSIS — I129 Hypertensive chronic kidney disease with stage 1 through stage 4 chronic kidney disease, or unspecified chronic kidney disease: Secondary | ICD-10-CM | POA: Insufficient documentation

## 2024-02-04 DIAGNOSIS — Z87891 Personal history of nicotine dependence: Secondary | ICD-10-CM | POA: Diagnosis not present

## 2024-02-04 DIAGNOSIS — E538 Deficiency of other specified B group vitamins: Secondary | ICD-10-CM | POA: Diagnosis not present

## 2024-02-04 DIAGNOSIS — C7951 Secondary malignant neoplasm of bone: Secondary | ICD-10-CM

## 2024-02-04 DIAGNOSIS — Z79899 Other long term (current) drug therapy: Secondary | ICD-10-CM | POA: Insufficient documentation

## 2024-02-04 DIAGNOSIS — N189 Chronic kidney disease, unspecified: Secondary | ICD-10-CM | POA: Insufficient documentation

## 2024-02-04 DIAGNOSIS — D61818 Other pancytopenia: Secondary | ICD-10-CM

## 2024-02-04 DIAGNOSIS — E1122 Type 2 diabetes mellitus with diabetic chronic kidney disease: Secondary | ICD-10-CM | POA: Insufficient documentation

## 2024-02-04 LAB — CMP (CANCER CENTER ONLY)
ALT: 24 U/L (ref 0–44)
AST: 17 U/L (ref 15–41)
Albumin: 4.1 g/dL (ref 3.5–5.0)
Alkaline Phosphatase: 64 U/L (ref 38–126)
Anion gap: 11 (ref 5–15)
BUN: 24 mg/dL — ABNORMAL HIGH (ref 8–23)
CO2: 22 mmol/L (ref 22–32)
Calcium: 8.6 mg/dL — ABNORMAL LOW (ref 8.9–10.3)
Chloride: 109 mmol/L (ref 98–111)
Creatinine: 1.37 mg/dL — ABNORMAL HIGH (ref 0.61–1.24)
GFR, Estimated: 53 mL/min — ABNORMAL LOW (ref >60)
Glucose, Bld: 238 mg/dL — ABNORMAL HIGH (ref 70–99)
Potassium: 4.5 mmol/L (ref 3.5–5.1)
Sodium: 142 mmol/L (ref 135–145)
Total Bilirubin: 0.4 mg/dL (ref 0.0–1.2)
Total Protein: 6.2 g/dL — ABNORMAL LOW (ref 6.5–8.1)

## 2024-02-04 LAB — CBC WITH DIFFERENTIAL/PLATELET
Abs Immature Granulocytes: 0.01 K/uL (ref 0.00–0.07)
Basophils Absolute: 0 K/uL (ref 0.0–0.1)
Basophils Relative: 1 %
Eosinophils Absolute: 0.1 K/uL (ref 0.0–0.5)
Eosinophils Relative: 2 %
HCT: 23 % — ABNORMAL LOW (ref 39.0–52.0)
Hemoglobin: 7.6 g/dL — ABNORMAL LOW (ref 13.0–17.0)
Immature Granulocytes: 0 %
Lymphocytes Relative: 13 %
Lymphs Abs: 0.5 K/uL — ABNORMAL LOW (ref 0.7–4.0)
MCH: 31.3 pg (ref 26.0–34.0)
MCHC: 33 g/dL (ref 30.0–36.0)
MCV: 94.7 fL (ref 80.0–100.0)
Monocytes Absolute: 0.3 K/uL (ref 0.1–1.0)
Monocytes Relative: 7 %
Neutro Abs: 3 K/uL (ref 1.7–7.7)
Neutrophils Relative %: 77 %
Platelets: 116 K/uL — ABNORMAL LOW (ref 150–400)
RBC: 2.43 MIL/uL — ABNORMAL LOW (ref 4.22–5.81)
RDW: 13.9 % (ref 11.5–15.5)
WBC: 3.8 K/uL — ABNORMAL LOW (ref 4.0–10.5)
nRBC: 0 % (ref 0.0–0.2)

## 2024-02-04 LAB — PREPARE RBC (CROSSMATCH)

## 2024-02-04 LAB — TSH: TSH: 3.24 u[IU]/mL (ref 0.350–4.500)

## 2024-02-04 LAB — VITAMIN B12: Vitamin B-12: 541 pg/mL (ref 180–914)

## 2024-02-04 NOTE — Telephone Encounter (Signed)
 I spoke with patient's spouse to re-schedule blood infusion for 02/05/2024 at 2:00 pm. Patienty and spouse aware of date and time change.

## 2024-02-04 NOTE — Telephone Encounter (Signed)
 I spoke with patient to re-schedule appointment time on 02/05/2024. Patient is aware of time change from 2:00 pm TO 8:30 am.

## 2024-02-04 NOTE — Progress Notes (Signed)
 Orders placed for 2 units PRBC and premeds per MD to have pt transfused tomorrow 12/9 at 0800. Pt is aware and agreeable. BB confirmed orders.

## 2024-02-04 NOTE — Progress Notes (Signed)
 Orders placed per MD for one unit PRBC and premeds. Pt is scheduled to come in 12/10 at 2 PM for transfusion. Orders verified with Garrel in BB.

## 2024-02-04 NOTE — Progress Notes (Signed)
 [facility] CONSULT NOTE  Patient Care Team: [name], MD as PCP - General (Family Medicine)  CHIEF COMPLAINTS/PURPOSE OF CONSULTATION:  Pancytopenia.  ASSESSMENT & PLAN:   Assessment & Plan  Normocytic normochromic anemia ? hemolytic anemia with hemoglobin at 7.6 g/dL. Bone marrow biopsy negative for malignancy. Low haptoglobin and normal reticulocyte count.  - So far the etiology of this severe anemia is unclear. Before he went to [place], his hb was 10.3 in July. - Ordered pan CT of chest, abdomen, and pelvis without contrast to evaluate for underlying lymphoma. - Referred to hematology at [facility] for second opinion to further investigate the cause for this anemia. - Arrange for blood transfusion to improve hemoglobin levels.  Chronic kidney disease Recent improvement in creatinine levels to 1.67 mg/dL. Gabapentin discontinued due to potential nephrotoxicity. - Continue monitoring kidney function with regular lab work.  Type 2 diabetes mellitus Recent medication adjustments. Currently on glimepiride . Blood glucose control needs monitoring. - Continue glimepiride  for diabetes management.  RTC as scheduled,   HISTORY OF PRESENTING ILLNESS:  Kenneth Lawson 78 y.o. male is here because of Pancytopenia.  History of Present Illness    Kenneth Lawson is a 78 year old male who presents for follow-up on B12 deficiency and anemia.   Discussed the use of AI scribe software for clinical note transcription with the patient, who gave verbal consent to proceed.  History of Present Illness   Kenneth Lawson is a 78 year old male with hemolytic anemia who presents for follow-up.  Since the end of October, he has seen a nephrologist multiple times and has been undergoing regular urinalysis and lab work every two weeks. His creatinine level has improved to 1.67. He has also seen his endocrinologist and primary care doctor. He had been off most of  his diabetes medications for several weeks, except for glimepiride , which he resumed. He has rarely taken gabapentin recently due to kidney concerns.  His past medical workup included a bone marrow biopsy, which did not show any cancer or primary bone marrow disorder. His reticulocyte count has been low, which is atypical for hemolytic anemia, despite low haptoglobin. His autoimmune panel was negative, and his complement levels were good. Previous tests for multiple myeloma and other conditions were also negative.   His symptoms began to worsen after a trip to [place], where he experienced severe dehydration and increased physical activity. Before the trip, his hemoglobin was over 10, and he was doing relatively well, although not at his previous level of physical activity. He has not resumed B12 vitamin shots, but this is being monitored.  Rest of the pertinent 10 point ROS reviewed and neg.   MEDICAL HISTORY:  Past Medical History:  Diagnosis Date   Arthritis    Diabetes mellitus without complication (HCC)    Hypercholesterolemia    Hypertension     SURGICAL HISTORY: Past Surgical History:  Procedure Laterality Date   IR BONE MARROW BIOPSY & ASPIRATION  12/17/2023   LAPARASCOPIC MEDICAN ARCUATE LIGAMENT RELEASE     MINOR HEMORRHOIDECTOMY N/A     SOCIAL HISTORY: Social History   Socioeconomic History   Marital status: Married    Spouse name: Not on file   Number of children: Not on file   Years of education: Not on file   Highest education level: Not on file  Occupational History   Not on file  Tobacco Use   Smoking status: Former    Types: Cigarettes  Smokeless tobacco: Never  Vaping Use   Vaping status: Not on file  Substance and Sexual Activity   Alcohol use: Yes    Comment: rarely   Drug use: Never   Sexual activity: Not on file  Other Topics Concern   Not on file  Social History Narrative   Pt lives with wife    Retired    Social Drivers of Manufacturing Engineer Strain: Not on file  Food Insecurity: No Food Insecurity (12/14/2023)   Hunger Vital Sign    Worried About Running Out of Food in the Last Year: Never true    Ran Out of Food in the Last Year: Never true  Transportation Needs: No Transportation Needs (12/14/2023)   PRAPARE - Administrator, Civil Service (Medical): No    Lack of Transportation (Non-Medical): No  Physical Activity: Low Risk (01/13/2024)   Received from [facility]   PCARE Exercise SDOH    Exercise: Aerobic    PCare Exercise SDOH: Not on file    PCare Exercise SDOH: Not on file  Stress: Not on file  Social Connections: Moderately Integrated (12/14/2023)   Social Connection and Isolation Panel    Frequency of Communication with Friends and Family: More than three times a week    Frequency of Social Gatherings with Friends and Family: More than three times a week    Attends Religious Services: More than 4 times per year    Active Member of [employer] or Organizations: No    Attends Banker Meetings: Never    Marital Status: Married  Catering Manager Violence: Not At Risk (12/15/2023)   Humiliation, Afraid, Rape, and Kick questionnaire    Fear of Current or Ex-Partner: No    Emotionally Abused: No    Physically Abused: No    Sexually Abused: No    FAMILY HISTORY: Family History  Problem Relation Age of Onset   Alzheimer's disease Mother    Cancer Neg Hx    Parkinson's disease Neg Hx    Tremor Neg Hx     ALLERGIES:  is allergic to metformin.  MEDICATIONS:  Current Outpatient Medications  Medication Sig Dispense Refill   ACCU-CHEK GUIDE test strip USE TO CHECK BLOOD SUGARS THREE TIMES DAILY. 100 strip 2   Accu-Chek Softclix Lancets lancets 3 times daily As directed DXE11.65 200 each 3   alfuzosin  (UROXATRAL ) 10 MG 24 hr tablet Take 10 mg by mouth daily.     [Paused] Alogliptin -Pioglitazone  25-30 MG TABS Take 1 tablet by mouth daily. (Patient taking  differently: Take 1 tablet by mouth daily. TAKE 1 TABLET BY MOUTH EVERY DAY) 90 tablet 3   ALPRAZolam  (XANAX ) 0.5 MG tablet Take 0.5 mg by mouth every other day as needed for anxiety. Take 1/2 tablet by mouth every other day as needed.     atorvastatin  (LIPITOR) 80 MG tablet Take 0.5 tablets (40 mg total) by mouth daily. 90 tablet 3   buPROPion  (WELLBUTRIN  XL) 300 MG 24 hr tablet Take 300 mg by mouth daily.     busPIRone  (BUSPAR ) 10 MG tablet Take 5-10 mg by mouth 2 (two) times daily as needed (Anxiety). TAKE 1/2 TO 1 TABLET TWICE A DAY AS NEEDED FOR ANXIETY     cyanocobalamin  (VITAMIN B12) 1000 MCG/ML injection Inject 1 mL (1,000 mcg total) into the muscle once a week. 1 mL 7   Eluxadoline  (VIBERZI ) 75 MG TABS Take 75 mg by mouth 2 (two) times daily.  finasteride  (PROSCAR ) 5 MG tablet Take 5 mg by mouth daily.     [Paused] gabapentin (NEURONTIN) 300 MG capsule Take 300 mg by mouth See admin instructions. 1 CAPSULE THREE TIMES DAILY WITH ONE EXTRA AT NIGHT AS NEEDED. ORALLY ONCE A DAY     [Paused] glimepiride  (AMARYL ) 4 MG tablet TAKE 1 TABLET AT BREAKFAST AND TAKE 1/2  AT LUNCH IF EATING A MEAL, OTHERWISE SKIP (Patient taking differently: Take 4 mg by mouth See admin instructions. TAKE 1 TABLET AT BREAKFAST AND TAKE 1/2  AT LUNCH IF EATING A MEAL, OTHERWISE SKIP) 180 tablet 0   mometasone (NASONEX) 50 MCG/ACT nasal spray Place 2 sprays into the nose daily as needed (Sinus).  5   NEEDLE, DISP, 23 G (BD DISP NEEDLE) 23G X 1 MISC Brand substitution OK 8 each 0   [Paused] Semaglutide  (RYBELSUS ) 7 MG TABS TAKE 1 TABLET DAILY BEFORE BREAKFAST WITH HALF GLASS  OF WATER . (Patient taking differently: Take 7 mg by mouth daily. TAKE 1 TABLET DAILY BEFORE BREAKFAST WITH HALF GLASS  OF WATER .) 90 tablet 1   tadalafil (CIALIS) 5 MG tablet Take 5 mg by mouth daily as needed for erectile dysfunction.     Vilazodone  HCl 20 MG TABS Take 20 mg by mouth daily.     No current facility-administered medications for  this visit.     PHYSICAL EXAMINATION: ECOG PERFORMANCE STATUS: 0 - Asymptomatic  Vitals:   02/04/24 1141  BP: (!) 133/48  Pulse: 85  Resp: 17  Temp: 97.7 F (36.5 C)  SpO2: 94%     Filed Weights   02/04/24 1141  Weight: 166 lb 12.8 oz (75.7 kg)      GENERAL:alert, no distress and comfortable, pale   LABORATORY DATA:  I have reviewed the data as listed Lab Results  Component Value Date   WBC 3.8 (L) 02/04/2024   HGB 7.6 (L) 02/04/2024   HCT 23.0 (L) 02/04/2024   MCV 94.7 02/04/2024   PLT 116 (L) 02/04/2024     Chemistry      Component Value Date/Time   NA 142 12/27/2023 0925   NA 140 02/13/2019 1558   K 4.1 12/27/2023 0925   CL 110 12/27/2023 0925   CO2 28 12/27/2023 0925   BUN 32 (H) 12/27/2023 0925   BUN 19 02/13/2019 1558   CREATININE 1.75 (H) 12/27/2023 0925      Component Value Date/Time   CALCIUM  8.8 (L) 12/27/2023 0925   ALKPHOS 62 12/27/2023 0925   AST 12 (L) 12/27/2023 0925   ALT 15 12/27/2023 0925   BILITOT 0.5 12/27/2023 0925       RADIOGRAPHIC STUDIES: I have personally reviewed the radiological images as listed and agreed with the findings in the report. No results found.   All questions were answered. The patient knows to call the clinic with any problems, questions or concerns. I spent 30 minutes in the care of this patient including H and P, review of records, counseling and coordination of care.     Amber Stalls, MD 02/04/2024 11:45 AM

## 2024-02-05 ENCOUNTER — Other Ambulatory Visit: Payer: Self-pay

## 2024-02-05 ENCOUNTER — Encounter: Payer: Self-pay | Admitting: Hematology and Oncology

## 2024-02-05 ENCOUNTER — Inpatient Hospital Stay

## 2024-02-05 DIAGNOSIS — D61818 Other pancytopenia: Secondary | ICD-10-CM | POA: Diagnosis not present

## 2024-02-05 DIAGNOSIS — C8590 Non-Hodgkin lymphoma, unspecified, unspecified site: Secondary | ICD-10-CM

## 2024-02-05 DIAGNOSIS — D649 Anemia, unspecified: Secondary | ICD-10-CM

## 2024-02-05 MED ORDER — DIPHENHYDRAMINE HCL 25 MG PO CAPS
25.0000 mg | ORAL_CAPSULE | Freq: Once | ORAL | Status: AC
  Administered 2024-02-05: 25 mg via ORAL
  Filled 2024-02-05: qty 1

## 2024-02-05 MED ORDER — SODIUM CHLORIDE 0.9% IV SOLUTION
250.0000 mL | INTRAVENOUS | Status: DC
  Administered 2024-02-05: 100 mL via INTRAVENOUS

## 2024-02-05 MED ORDER — ACETAMINOPHEN 325 MG PO TABS
650.0000 mg | ORAL_TABLET | Freq: Once | ORAL | Status: AC
  Administered 2024-02-05: 650 mg via ORAL
  Filled 2024-02-05: qty 2

## 2024-02-05 NOTE — Patient Instructions (Signed)

## 2024-02-06 LAB — TYPE AND SCREEN
ABO/RH(D): A NEG
Antibody Screen: NEGATIVE
Unit division: 0
Unit division: 0

## 2024-02-06 LAB — BPAM RBC
Blood Product Expiration Date: 202512302359
Blood Product Expiration Date: 202512262359
ISSUE DATE / TIME: 202512100854
ISSUE DATE / TIME: 202512100854
Unit Type and Rh: 202512302359
Unit Type and Rh: 600
Unit Type and Rh: 600

## 2024-02-18 ENCOUNTER — Ambulatory Visit (HOSPITAL_COMMUNITY)
Admission: RE | Admit: 2024-02-18 | Discharge: 2024-02-18 | Disposition: A | Discharge status: Home / Self Care | Source: Ambulatory Visit | Attending: Hematology and Oncology | Admitting: Hematology and Oncology

## 2024-02-18 DIAGNOSIS — D649 Anemia, unspecified: Secondary | ICD-10-CM | POA: Diagnosis present

## 2024-02-18 DIAGNOSIS — C8590 Non-Hodgkin lymphoma, unspecified, unspecified site: Secondary | ICD-10-CM | POA: Diagnosis present

## 2024-02-18 MED ORDER — IOHEXOL 300 MG/ML  SOLN
100.0000 mL | Freq: Once | INTRAMUSCULAR | Status: AC | PRN
  Administered 2024-02-18: 100 mL via INTRAVENOUS

## 2024-02-24 ENCOUNTER — Inpatient Hospital Stay

## 2024-02-24 ENCOUNTER — Inpatient Hospital Stay: Admitting: Hematology and Oncology

## 2024-02-24 VITALS — BP 164/100 | HR 74 | Temp 98.3°F | Resp 18 | Ht 68.0 in | Wt 163.6 lb

## 2024-02-24 DIAGNOSIS — D649 Anemia, unspecified: Secondary | ICD-10-CM

## 2024-02-24 DIAGNOSIS — D61818 Other pancytopenia: Secondary | ICD-10-CM | POA: Diagnosis not present

## 2024-02-24 LAB — CBC WITH DIFFERENTIAL/PLATELET
Abs Immature Granulocytes: 0.05 K/uL (ref 0.00–0.07)
Basophils Absolute: 0 K/uL (ref 0.0–0.1)
Basophils Relative: 1 %
Eosinophils Absolute: 0.1 K/uL (ref 0.0–0.5)
Eosinophils Relative: 2 %
HCT: 30.4 % — ABNORMAL LOW (ref 39.0–52.0)
Hemoglobin: 10.4 g/dL — ABNORMAL LOW (ref 13.0–17.0)
Immature Granulocytes: 1 %
Lymphocytes Relative: 13 %
Lymphs Abs: 0.6 K/uL — ABNORMAL LOW (ref 0.7–4.0)
MCH: 30.3 pg (ref 26.0–34.0)
MCHC: 34.2 g/dL (ref 30.0–36.0)
MCV: 88.6 fL (ref 80.0–100.0)
Monocytes Absolute: 0.3 K/uL (ref 0.1–1.0)
Monocytes Relative: 7 %
Neutro Abs: 3.5 K/uL (ref 1.7–7.7)
Neutrophils Relative %: 76 %
Platelets: 125 K/uL — ABNORMAL LOW (ref 150–400)
RBC: 3.43 MIL/uL — ABNORMAL LOW (ref 4.22–5.81)
RDW: 12.9 % (ref 11.5–15.5)
WBC: 4.6 K/uL (ref 4.0–10.5)
nRBC: 0 % (ref 0.0–0.2)

## 2024-02-24 LAB — CMP (CANCER CENTER ONLY)
ALT: 27 U/L (ref 0–44)
AST: 21 U/L (ref 15–41)
Albumin: 4.3 g/dL (ref 3.5–5.0)
Alkaline Phosphatase: 69 U/L (ref 38–126)
Anion gap: 9 (ref 5–15)
BUN: 26 mg/dL — ABNORMAL HIGH (ref 8–23)
CO2: 23 mmol/L (ref 22–32)
Calcium: 9.2 mg/dL (ref 8.9–10.3)
Chloride: 109 mmol/L (ref 98–111)
Creatinine: 1.38 mg/dL — ABNORMAL HIGH (ref 0.61–1.24)
GFR, Estimated: 53 mL/min — ABNORMAL LOW
Glucose, Bld: 167 mg/dL — ABNORMAL HIGH (ref 70–99)
Potassium: 4.3 mmol/L (ref 3.5–5.1)
Sodium: 141 mmol/L (ref 135–145)
Total Bilirubin: 0.6 mg/dL (ref 0.0–1.2)
Total Protein: 6.8 g/dL (ref 6.5–8.1)

## 2024-02-24 LAB — TSH: TSH: 5.24 u[IU]/mL — ABNORMAL HIGH (ref 0.350–4.500)

## 2024-02-24 LAB — VITAMIN B12: Vitamin B-12: 541 pg/mL (ref 180–914)

## 2024-02-24 NOTE — Progress Notes (Signed)
 [facility] CONSULT NOTE  Patient Care Team: [name], MD as PCP - General (Family Medicine)  CHIEF COMPLAINTS/PURPOSE OF CONSULTATION:  Pancytopenia. Assessment & Plan  Mild hemolytic anemia Chronic, compensated hemolytic anemia with stable parameters and no evidence of hematologic malignancy. Differential includes hereditary red cell membrane or enzyme defects and environmental or dietary triggers. - Ordered G6PD and pyruvate kinase assay today. - Requested pathologist review of peripheral smear, he had some ovalocytes last smear. - Advised avoidance of hemolysis triggers including alkaline water , quinine, strenuous exercise, dehydration, and new medications. - CBC today with back to baseline Hb which is slightly over 10. - Scheduled follow-up in one month. - Referred to [facility] for second opinion; pending scheduling.  Mild thrombocytopenia Chronic, mild thrombocytopenia with stable laboratory findings. - Monitor platelet counts at follow-up visits. - BMB with no primary hematologic process - CT imaging with no evidence of lymphadenopathy concerning for lymphoma - Mild splenomegaly noted.   HISTORY OF PRESENTING ILLNESS:  Kenneth Lawson 78 y.o. male is here because of Pancytopenia.  History of Present Illness  Kenneth Lawson is a 78 year old male with ? Acute on chronic hemolytic anemia and thrombocytopenia who presents for hematology follow-up to evaluate ongoing hemolysis and review recent laboratory and imaging results.  He and his caregiver have eliminated potential hemolysis triggers, including discontinuing alkaline water  and ginger ales containing quinine. He recently increased physical activity during travel, including extensive walking and hill climbing, which may have contributed to hemolytic episodes.  He expresses frustration with frequent medical appointments and follow-up with multiple specialists, but is pleased with recent  improvement in laboratory results and reassuring imaging findings.  Rest of the pertinent 10 point ROS reviewed and neg.   MEDICAL HISTORY:  Past Medical History:  Diagnosis Date   Arthritis    Diabetes mellitus without complication (HCC)    Hypercholesterolemia    Hypertension     SURGICAL HISTORY: Past Surgical History:  Procedure Laterality Date   IR BONE MARROW BIOPSY & ASPIRATION  12/17/2023   LAPARASCOPIC MEDICAN ARCUATE LIGAMENT RELEASE     MINOR HEMORRHOIDECTOMY N/A     SOCIAL HISTORY: Social History   Socioeconomic History   Marital status: Married    Spouse name: Not on file   Number of children: Not on file   Years of education: Not on file   Highest education level: Not on file  Occupational History   Not on file  Tobacco Use   Smoking status: Former    Types: Cigarettes   Smokeless tobacco: Never  Vaping Use   Vaping status: Not on file  Substance and Sexual Activity   Alcohol use: Yes    Comment: rarely   Drug use: Never   Sexual activity: Not on file  Other Topics Concern   Not on file  Social History Narrative   Pt lives with wife    Retired    Social Drivers of Health   Tobacco Use: Low Risk (01/13/2024)   Received from [facility]   PCARE Tobacco SDOH    Current tobacco Use: No    PCare Tobacco SDOH: Not on file  Recent Concern: Tobacco Use - Medium Risk (12/30/2023)   Patient History    Smoking Tobacco Use: Former    Smokeless Tobacco Use: Never    Passive Exposure: Not on Actuary Strain: Not on file  Food Insecurity: No Food Insecurity (12/14/2023)   Epic    Worried  About Running Out of Food in the Last Year: Never true    Ran Out of Food in the Last Year: Never true  Transportation Needs: No Transportation Needs (12/14/2023)   Epic    Lack of Transportation (Medical): No    Lack of Transportation (Non-Medical): No  Physical Activity: Low Risk (01/13/2024)   Received from [facility]   PCARE Exercise SDOH    Exercise: Aerobic    PCare Exercise SDOH: Not on file    PCare Exercise SDOH: Not on file  Stress: Not on file  Social Connections: Moderately Integrated (12/14/2023)   Social Connection and Isolation Panel    Frequency of Communication with Friends and Family: More than three times a week    Frequency of Social Gatherings with Friends and Family: More than three times a week    Attends Religious Services: More than 4 times per year    Active Member of [employer] or Organizations: No    Attends Banker Meetings: Never    Marital Status: Married  Catering Manager Violence: Not At Risk (12/15/2023)   Epic    Fear of Current or Ex-Partner: No    Emotionally Abused: No    Physically Abused: No    Sexually Abused: No  Depression (PHQ2-9): Low Risk (02/05/2024)   Depression (PHQ2-9)    PHQ-2 Score: 0  Alcohol Screen: Not on file  Housing: Low Risk (12/14/2023)   Epic    Unable to Pay for Housing in the Last Year: No    Number of Times Moved in the Last Year: 1    Homeless in the Last Year: No  Utilities: Not At Risk (12/14/2023)   Epic    Threatened with loss of utilities: No  Health Literacy: Not on file    FAMILY HISTORY: Family History  Problem Relation Age of Onset   Alzheimer's disease Mother    Cancer Neg Hx    Parkinson's disease Neg Hx    Tremor Neg Hx     ALLERGIES:  is allergic to metformin.  MEDICATIONS:  Current Outpatient Medications  Medication Sig Dispense Refill   ACCU-CHEK GUIDE test strip USE TO CHECK BLOOD SUGARS THREE TIMES DAILY. 100 strip 2   Accu-Chek Softclix Lancets lancets 3 times daily As directed DXE11.65 200 each 3   alfuzosin  (UROXATRAL ) 10 MG 24 hr tablet Take 10 mg by mouth daily.     [Paused] Alogliptin -Pioglitazone  25-30 MG TABS Take 1 tablet by mouth daily. (Patient taking differently: Take 1 tablet by mouth daily. TAKE 1 TABLET BY MOUTH EVERY DAY) 90 tablet 3   ALPRAZolam  (XANAX ) 0.5 MG  tablet Take 0.5 mg by mouth every other day as needed for anxiety. Take 1/2 tablet by mouth every other day as needed.     atorvastatin  (LIPITOR) 80 MG tablet Take 0.5 tablets (40 mg total) by mouth daily. 90 tablet 3   buPROPion  (WELLBUTRIN  XL) 300 MG 24 hr tablet Take 300 mg by mouth daily.     busPIRone  (BUSPAR ) 10 MG tablet Take 5-10 mg by mouth 2 (two) times daily as needed (Anxiety). TAKE 1/2 TO 1 TABLET TWICE A DAY AS NEEDED FOR ANXIETY     cyanocobalamin  (VITAMIN B12) 1000 MCG/ML injection Inject 1 mL (1,000 mcg total) into the muscle once a week. 1 mL 7   Eluxadoline  (VIBERZI ) 75 MG TABS Take 75 mg by mouth 2 (two) times daily.     finasteride  (PROSCAR ) 5 MG tablet Take 5 mg by  mouth daily.     [Paused] gabapentin  (NEURONTIN ) 300 MG capsule Take 300 mg by mouth See admin instructions. 1 CAPSULE THREE TIMES DAILY WITH ONE EXTRA AT NIGHT AS NEEDED. ORALLY ONCE A DAY     [Paused] glimepiride  (AMARYL ) 4 MG tablet TAKE 1 TABLET AT BREAKFAST AND TAKE 1/2  AT LUNCH IF EATING A MEAL, OTHERWISE SKIP (Patient taking differently: Take 4 mg by mouth See admin instructions. TAKE 1 TABLET AT BREAKFAST AND TAKE 1/2  AT LUNCH IF EATING A MEAL, OTHERWISE SKIP) 180 tablet 0   mometasone (NASONEX) 50 MCG/ACT nasal spray Place 2 sprays into the nose daily as needed (Sinus).  5   NEEDLE, DISP, 23 G (BD DISP NEEDLE) 23G X 1 MISC Brand substitution OK 8 each 0   [Paused] Semaglutide  (RYBELSUS ) 7 MG TABS TAKE 1 TABLET DAILY BEFORE BREAKFAST WITH HALF GLASS  OF WATER . (Patient taking differently: Take 7 mg by mouth daily. TAKE 1 TABLET DAILY BEFORE BREAKFAST WITH HALF GLASS  OF WATER .) 90 tablet 1   tadalafil (CIALIS) 5 MG tablet Take 5 mg by mouth daily as needed for erectile dysfunction.     Vilazodone  HCl 20 MG TABS Take 20 mg by mouth daily.     No current facility-administered medications for this visit.     PHYSICAL EXAMINATION: ECOG PERFORMANCE STATUS: 0 - Asymptomatic  Vitals:   02/24/24 1205  02/24/24 1213  BP: (!) 153/70 (!) 164/100  Pulse: 74   Resp: 18   Temp: 98.3 F (36.8 C)   SpO2: 94%      Filed Weights   02/24/24 1205  Weight: 163 lb 9.6 oz (74.2 kg)      GENERAL:alert, no distress and comfortable,   LABORATORY DATA:  I have reviewed the data as listed Lab Results  Component Value Date   WBC 4.6 02/24/2024   HGB 10.4 (L) 02/24/2024   HCT 30.4 (L) 02/24/2024   MCV 88.6 02/24/2024   PLT 125 (L) 02/24/2024     Chemistry      Component Value Date/Time   NA 142 02/04/2024 1120   NA 140 02/13/2019 1558   K 4.5 02/04/2024 1120   CL 109 02/04/2024 1120   CO2 22 02/04/2024 1120   BUN 24 (H) 02/04/2024 1120   BUN 19 02/13/2019 1558   CREATININE 1.37 (H) 02/04/2024 1120      Component Value Date/Time   CALCIUM  8.6 (L) 02/04/2024 1120   ALKPHOS 64 02/04/2024 1120   AST 17 02/04/2024 1120   ALT 24 02/04/2024 1120   BILITOT 0.4 02/04/2024 1120       RADIOGRAPHIC STUDIES: I have personally reviewed the radiological images as listed and agreed with the findings in the report. CT CHEST ABDOMEN PELVIS W CONTRAST Result Date: 02/19/2024 CLINICAL DATA:  Severe anemia, pancytopenia, evaluate for lymphoma * Tracking Code: BO * EXAM: CT CHEST, ABDOMEN, AND PELVIS WITH CONTRAST TECHNIQUE: Multidetector CT imaging of the chest, abdomen and pelvis was performed following the standard protocol during bolus administration of intravenous contrast. RADIATION DOSE REDUCTION: This exam was performed according to the departmental dose-optimization program which includes automated exposure control, adjustment of the mA and/or kV according to patient size and/or use of iterative reconstruction technique. CONTRAST:  OMNIPAQUE  IOHEXOL  300 MG/ML  SOLN COMPARISON:  CT abdomen pelvis, 02/10/2022 FINDINGS: CT CHEST FINDINGS Cardiovascular: Aortic atherosclerosis. Normal heart size. Scattered left and right coronary artery calcifications. No pericardial effusion.  Mediastinum/Nodes: No enlarged mediastinal, hilar, or axillary lymph nodes.  Thyroid  gland, trachea, and esophagus demonstrate no significant findings. Lungs/Pleura: Lungs are clear. No pleural effusion or pneumothorax. Musculoskeletal: No chest wall abnormality. No acute osseous findings. CT ABDOMEN PELVIS FINDINGS Hepatobiliary: No solid liver abnormality is seen. Small gallstones. No gallbladder wall thickening, or biliary dilatation. Pancreas: Unremarkable. No pancreatic ductal dilatation or surrounding inflammatory changes. Spleen: Mild splenomegaly, maximum span 13.5 cm. Adrenals/Urinary Tract: Adrenal glands are unremarkable. Kidneys are normal, without renal calculi, solid lesion, or hydronephrosis. Bladder is unremarkable. Stomach/Bowel: Stomach is within normal limits. Appendix appears normal. No evidence of bowel wall thickening, distention, or inflammatory changes. Sigmoid diverticulosis. Vascular/Lymphatic: Aortic atherosclerosis. No enlarged abdominal or pelvic lymph nodes. Reproductive: Prostatomegaly. Other: No abdominal wall hernia or abnormality. No ascites. Musculoskeletal: No acute osseous findings. IMPRESSION: 1. No evidence of mass, lymphadenopathy, or metastatic disease in the chest, abdomen, or pelvis. 2. Mild splenomegaly. 3. Cholelithiasis. 4. Sigmoid diverticulosis without evidence of acute diverticulitis. 5. Prostatomegaly. 6. Coronary artery disease. Aortic Atherosclerosis (ICD10-I70.0). Electronically Signed   By: Marolyn JONETTA Jaksch M.D.   On: 02/19/2024 15:33     All questions were answered. The patient knows to call the clinic with any problems, questions or concerns. I spent 30 minutes in the care of this patient including H and P, review of records, counseling and coordination of care.     Amber Stalls, MD 02/24/2024 12:18 PM

## 2024-02-24 NOTE — Progress Notes (Signed)
 [facility] CONSULT NOTE  Patient Care Team: [name], MD as PCP - General (Family Medicine)  CHIEF COMPLAINTS/PURPOSE OF CONSULTATION:  Pancytopenia.  ASSESSMENT & PLAN:   Assessment & Plan  Normocytic normochromic anemia ? hemolytic anemia with hemoglobin at 7.6 g/dL. Bone marrow biopsy negative for malignancy. Low haptoglobin and normal reticulocyte count.  - So far the etiology of this severe anemia is unclear. Before he went to [place], his hb was 10.3 in July.   RTC as scheduled,   HISTORY OF PRESENTING ILLNESS:  Kenneth Lawson 78 y.o. male is here because of Pancytopenia.  History of Present Illness    Kenneth Lawson is a 79 year old male who presents for follow-up on B12 deficiency and anemia.   Discussed the use of AI scribe software for clinical note transcription with the patient, who gave verbal consent to proceed.  History of Present Illness   Kenneth Lawson is a 78 year old male with hemolytic anemia who presents for follow-up.  Since the end of October, he has seen a nephrologist multiple times and has been undergoing regular urinalysis and lab work every two weeks. His creatinine level has improved to 1.67. He has also seen his endocrinologist and primary care doctor. He had been off most of his diabetes medications for several weeks, except for glimepiride , which he resumed. He has rarely taken gabapentin  recently due to kidney concerns.  His past medical workup included a bone marrow biopsy, which did not show any cancer or primary bone marrow disorder. His reticulocyte count has been low, which is atypical for hemolytic anemia, despite low haptoglobin. His autoimmune panel was negative, and his complement levels were good. Previous tests for multiple myeloma and other conditions were also negative.   His symptoms began to worsen after a trip to [place], where he experienced severe dehydration and increased physical activity.  Before the trip, his hemoglobin was over 10, and he was doing relatively well, although not at his previous level of physical activity. He has not resumed B12 vitamin shots, but this is being monitored.  Rest of the pertinent 10 point ROS reviewed and neg.   MEDICAL HISTORY:  Past Medical History:  Diagnosis Date   Arthritis    Diabetes mellitus without complication (HCC)    Hypercholesterolemia    Hypertension     SURGICAL HISTORY: Past Surgical History:  Procedure Laterality Date   IR BONE MARROW BIOPSY & ASPIRATION  12/17/2023   LAPARASCOPIC MEDICAN ARCUATE LIGAMENT RELEASE     MINOR HEMORRHOIDECTOMY N/A     SOCIAL HISTORY: Social History   Socioeconomic History   Marital status: Married    Spouse name: Not on file   Number of children: Not on file   Years of education: Not on file   Highest education level: Not on file  Occupational History   Not on file  Tobacco Use   Smoking status: Former    Types: Cigarettes   Smokeless tobacco: Never  Vaping Use   Vaping status: Not on file  Substance and Sexual Activity   Alcohol use: Yes    Comment: rarely   Drug use: Never   Sexual activity: Not on file  Other Topics Concern   Not on file  Social History Narrative   Pt lives with wife    Retired    Social Drivers of Health   Tobacco Use: Low Risk (01/13/2024)   Received from [facility]   PCARE Tobacco SDOH  Current tobacco Use: No    PCare Tobacco SDOH: Not on file  Recent Concern: Tobacco Use - Medium Risk (12/30/2023)   Patient History    Smoking Tobacco Use: Former    Smokeless Tobacco Use: Never    Passive Exposure: Not on Actuary Strain: Not on file  Food Insecurity: No Food Insecurity (12/14/2023)   Epic    Worried About Programme Researcher, Broadcasting/film/video in the Last Year: Never true    Ran Out of Food in the Last Year: Never true  Transportation Needs: No Transportation Needs (12/14/2023)   Epic    Lack of Transportation  (Medical): No    Lack of Transportation (Non-Medical): No  Physical Activity: Low Risk (01/13/2024)   Received from [facility]   PCARE Exercise SDOH    Exercise: Aerobic    PCare Exercise SDOH: Not on file    PCare Exercise SDOH: Not on file  Stress: Not on file  Social Connections: Moderately Integrated (12/14/2023)   Social Connection and Isolation Panel    Frequency of Communication with Friends and Family: More than three times a week    Frequency of Social Gatherings with Friends and Family: More than three times a week    Attends Religious Services: More than 4 times per year    Active Member of [employer] or Organizations: No    Attends Banker Meetings: Never    Marital Status: Married  Catering Manager Violence: Not At Risk (12/15/2023)   Epic    Fear of Current or Ex-Partner: No    Emotionally Abused: No    Physically Abused: No    Sexually Abused: No  Depression (PHQ2-9): Low Risk (02/05/2024)   Depression (PHQ2-9)    PHQ-2 Score: 0  Alcohol Screen: Not on file  Housing: Low Risk (12/14/2023)   Epic    Unable to Pay for Housing in the Last Year: No    Number of Times Moved in the Last Year: 1    Homeless in the Last Year: No  Utilities: Not At Risk (12/14/2023)   Epic    Threatened with loss of utilities: No  Health Literacy: Not on file    FAMILY HISTORY: Family History  Problem Relation Age of Onset   Alzheimer's disease Mother    Cancer Neg Hx    Parkinson's disease Neg Hx    Tremor Neg Hx     ALLERGIES:  is allergic to metformin.  MEDICATIONS:  Current Outpatient Medications  Medication Sig Dispense Refill   ACCU-CHEK GUIDE test strip USE TO CHECK BLOOD SUGARS THREE TIMES DAILY. 100 strip 2   Accu-Chek Softclix Lancets lancets 3 times daily As directed DXE11.65 200 each 3   alfuzosin  (UROXATRAL ) 10 MG 24 hr tablet Take 10 mg by mouth daily.     [Paused] Alogliptin -Pioglitazone  25-30 MG TABS Take 1 tablet by mouth daily.  (Patient taking differently: Take 1 tablet by mouth daily. TAKE 1 TABLET BY MOUTH EVERY DAY) 90 tablet 3   ALPRAZolam  (XANAX ) 0.5 MG tablet Take 0.5 mg by mouth every other day as needed for anxiety. Take 1/2 tablet by mouth every other day as needed.     atorvastatin  (LIPITOR) 80 MG tablet Take 0.5 tablets (40 mg total) by mouth daily. 90 tablet 3   buPROPion  (WELLBUTRIN  XL) 300 MG 24 hr tablet Take 300 mg by mouth daily.     busPIRone  (BUSPAR ) 10 MG tablet Take 5-10 mg by mouth 2 (two) times daily  as needed (Anxiety). TAKE 1/2 TO 1 TABLET TWICE A DAY AS NEEDED FOR ANXIETY     cyanocobalamin  (VITAMIN B12) 1000 MCG/ML injection Inject 1 mL (1,000 mcg total) into the muscle once a week. 1 mL 7   Eluxadoline  (VIBERZI ) 75 MG TABS Take 75 mg by mouth 2 (two) times daily.     finasteride  (PROSCAR ) 5 MG tablet Take 5 mg by mouth daily.     [Paused] gabapentin  (NEURONTIN ) 300 MG capsule Take 300 mg by mouth See admin instructions. 1 CAPSULE THREE TIMES DAILY WITH ONE EXTRA AT NIGHT AS NEEDED. ORALLY ONCE A DAY     [Paused] glimepiride  (AMARYL ) 4 MG tablet TAKE 1 TABLET AT BREAKFAST AND TAKE 1/2  AT LUNCH IF EATING A MEAL, OTHERWISE SKIP (Patient taking differently: Take 4 mg by mouth See admin instructions. TAKE 1 TABLET AT BREAKFAST AND TAKE 1/2  AT LUNCH IF EATING A MEAL, OTHERWISE SKIP) 180 tablet 0   mometasone (NASONEX) 50 MCG/ACT nasal spray Place 2 sprays into the nose daily as needed (Sinus).  5   NEEDLE, DISP, 23 G (BD DISP NEEDLE) 23G X 1 MISC Brand substitution OK 8 each 0   [Paused] Semaglutide  (RYBELSUS ) 7 MG TABS TAKE 1 TABLET DAILY BEFORE BREAKFAST WITH HALF GLASS  OF WATER . (Patient taking differently: Take 7 mg by mouth daily. TAKE 1 TABLET DAILY BEFORE BREAKFAST WITH HALF GLASS  OF WATER .) 90 tablet 1   tadalafil (CIALIS) 5 MG tablet Take 5 mg by mouth daily as needed for erectile dysfunction.     Vilazodone  HCl 20 MG TABS Take 20 mg by mouth daily.     No current facility-administered  medications for this visit.     PHYSICAL EXAMINATION: ECOG PERFORMANCE STATUS: 0 - Asymptomatic  Vitals:   02/24/24 1205 02/24/24 1213  BP: (!) 153/70 (!) 164/100  Pulse: 74   Resp: 18   Temp: 98.3 F (36.8 C)   SpO2: 94%      Filed Weights   02/24/24 1205  Weight: 163 lb 9.6 oz (74.2 kg)      GENERAL:alert, no distress and comfortable, pale   LABORATORY DATA:  I have reviewed the data as listed Lab Results  Component Value Date   WBC 4.6 02/24/2024   HGB 10.4 (L) 02/24/2024   HCT 30.4 (L) 02/24/2024   MCV 88.6 02/24/2024   PLT 125 (L) 02/24/2024     Chemistry      Component Value Date/Time   NA 142 02/04/2024 1120   NA 140 02/13/2019 1558   K 4.5 02/04/2024 1120   CL 109 02/04/2024 1120   CO2 22 02/04/2024 1120   BUN 24 (H) 02/04/2024 1120   BUN 19 02/13/2019 1558   CREATININE 1.37 (H) 02/04/2024 1120      Component Value Date/Time   CALCIUM  8.6 (L) 02/04/2024 1120   ALKPHOS 64 02/04/2024 1120   AST 17 02/04/2024 1120   ALT 24 02/04/2024 1120   BILITOT 0.4 02/04/2024 1120       RADIOGRAPHIC STUDIES: I have personally reviewed the radiological images as listed and agreed with the findings in the report. CT CHEST ABDOMEN PELVIS W CONTRAST Result Date: 02/19/2024 CLINICAL DATA:  Severe anemia, pancytopenia, evaluate for lymphoma * Tracking Code: BO * EXAM: CT CHEST, ABDOMEN, AND PELVIS WITH CONTRAST TECHNIQUE: Multidetector CT imaging of the chest, abdomen and pelvis was performed following the standard protocol during bolus administration of intravenous contrast. RADIATION DOSE REDUCTION: This exam was performed according to  the departmental dose-optimization program which includes automated exposure control, adjustment of the mA and/or kV according to patient size and/or use of iterative reconstruction technique. CONTRAST:  OMNIPAQUE  IOHEXOL  300 MG/ML  SOLN COMPARISON:  CT abdomen pelvis, 02/10/2022 FINDINGS: CT CHEST FINDINGS Cardiovascular:  Aortic atherosclerosis. Normal heart size. Scattered left and right coronary artery calcifications. No pericardial effusion. Mediastinum/Nodes: No enlarged mediastinal, hilar, or axillary lymph nodes. Thyroid  gland, trachea, and esophagus demonstrate no significant findings. Lungs/Pleura: Lungs are clear. No pleural effusion or pneumothorax. Musculoskeletal: No chest wall abnormality. No acute osseous findings. CT ABDOMEN PELVIS FINDINGS Hepatobiliary: No solid liver abnormality is seen. Small gallstones. No gallbladder wall thickening, or biliary dilatation. Pancreas: Unremarkable. No pancreatic ductal dilatation or surrounding inflammatory changes. Spleen: Mild splenomegaly, maximum span 13.5 cm. Adrenals/Urinary Tract: Adrenal glands are unremarkable. Kidneys are normal, without renal calculi, solid lesion, or hydronephrosis. Bladder is unremarkable. Stomach/Bowel: Stomach is within normal limits. Appendix appears normal. No evidence of bowel wall thickening, distention, or inflammatory changes. Sigmoid diverticulosis. Vascular/Lymphatic: Aortic atherosclerosis. No enlarged abdominal or pelvic lymph nodes. Reproductive: Prostatomegaly. Other: No abdominal wall hernia or abnormality. No ascites. Musculoskeletal: No acute osseous findings. IMPRESSION: 1. No evidence of mass, lymphadenopathy, or metastatic disease in the chest, abdomen, or pelvis. 2. Mild splenomegaly. 3. Cholelithiasis. 4. Sigmoid diverticulosis without evidence of acute diverticulitis. 5. Prostatomegaly. 6. Coronary artery disease. Aortic Atherosclerosis (ICD10-I70.0). Electronically Signed   By: Marolyn JONETTA Jaksch M.D.   On: 02/19/2024 15:33     All questions were answered. The patient knows to call the clinic with any problems, questions or concerns. I spent 30 minutes in the care of this patient including H and P, review of records, counseling and coordination of care.     Amber Stalls, MD 02/24/2024 12:19 PM

## 2024-02-25 LAB — TECHNOLOGIST SMEAR REVIEW: Plt Morphology: NORMAL

## 2024-02-25 LAB — GLUCOSE 6 PHOSPHATE DEHYDROGENASE
G6PDH: 10.5 U/g{Hb} (ref 4.8–15.7)
Hemoglobin: 10.4 g/dL — ABNORMAL LOW (ref 13.0–17.7)

## 2024-02-27 LAB — PYRUVATE KINASE: Pyruvate Kinase (PK): 9.1 U/g{Hb} (ref 4.6–11.2)

## 2024-03-24 ENCOUNTER — Other Ambulatory Visit: Payer: Self-pay | Admitting: *Deleted

## 2024-03-24 ENCOUNTER — Inpatient Hospital Stay

## 2024-03-24 ENCOUNTER — Inpatient Hospital Stay: Admitting: Hematology and Oncology

## 2024-03-24 ENCOUNTER — Inpatient Hospital Stay: Attending: Hematology and Oncology

## 2024-03-24 VITALS — BP 130/62 | HR 77 | Temp 98.0°F | Resp 17 | Ht 68.0 in | Wt 159.3 lb

## 2024-03-24 DIAGNOSIS — C8590 Non-Hodgkin lymphoma, unspecified, unspecified site: Secondary | ICD-10-CM

## 2024-03-24 DIAGNOSIS — D649 Anemia, unspecified: Secondary | ICD-10-CM

## 2024-03-24 DIAGNOSIS — D61818 Other pancytopenia: Secondary | ICD-10-CM | POA: Insufficient documentation

## 2024-03-24 DIAGNOSIS — D591 Autoimmune hemolytic anemia, unspecified: Secondary | ICD-10-CM | POA: Diagnosis present

## 2024-03-24 DIAGNOSIS — Z87891 Personal history of nicotine dependence: Secondary | ICD-10-CM | POA: Diagnosis not present

## 2024-03-24 DIAGNOSIS — D589 Hereditary hemolytic anemia, unspecified: Secondary | ICD-10-CM | POA: Insufficient documentation

## 2024-03-24 LAB — CBC WITH DIFFERENTIAL/PLATELET
Abs Immature Granulocytes: 0.02 10*3/uL (ref 0.00–0.07)
Basophils Absolute: 0.1 10*3/uL (ref 0.0–0.1)
Basophils Relative: 1 %
Eosinophils Absolute: 0.1 10*3/uL (ref 0.0–0.5)
Eosinophils Relative: 3 %
HCT: 23.2 % — ABNORMAL LOW (ref 39.0–52.0)
Hemoglobin: 7.3 g/dL — ABNORMAL LOW (ref 13.0–17.0)
Immature Granulocytes: 0 %
Lymphocytes Relative: 21 %
Lymphs Abs: 1 10*3/uL (ref 0.7–4.0)
MCH: 29.6 pg (ref 26.0–34.0)
MCHC: 31.5 g/dL (ref 30.0–36.0)
MCV: 93.9 fL (ref 80.0–100.0)
Monocytes Absolute: 0.4 10*3/uL (ref 0.1–1.0)
Monocytes Relative: 8 %
Neutro Abs: 3.2 10*3/uL (ref 1.7–7.7)
Neutrophils Relative %: 67 %
Platelets: 154 10*3/uL (ref 150–400)
RBC: 2.47 MIL/uL — ABNORMAL LOW (ref 4.22–5.81)
RDW: 13.7 % (ref 11.5–15.5)
WBC: 4.8 10*3/uL (ref 4.0–10.5)
nRBC: 0 % (ref 0.0–0.2)

## 2024-03-24 LAB — CMP (CANCER CENTER ONLY)
ALT: 21 U/L (ref 0–44)
AST: 18 U/L (ref 15–41)
Albumin: 4.4 g/dL (ref 3.5–5.0)
Alkaline Phosphatase: 71 U/L (ref 38–126)
Anion gap: 11 (ref 5–15)
BUN: 42 mg/dL — ABNORMAL HIGH (ref 8–23)
CO2: 22 mmol/L (ref 22–32)
Calcium: 8.9 mg/dL (ref 8.9–10.3)
Chloride: 109 mmol/L (ref 98–111)
Creatinine: 2.26 mg/dL — ABNORMAL HIGH (ref 0.61–1.24)
GFR, Estimated: 29 mL/min — ABNORMAL LOW
Glucose, Bld: 210 mg/dL — ABNORMAL HIGH (ref 70–99)
Potassium: 4.9 mmol/L (ref 3.5–5.1)
Sodium: 142 mmol/L (ref 135–145)
Total Bilirubin: 0.9 mg/dL (ref 0.0–1.2)
Total Protein: 6.7 g/dL (ref 6.5–8.1)

## 2024-03-24 LAB — TSH: TSH: 2.65 u[IU]/mL (ref 0.350–4.500)

## 2024-03-24 LAB — LACTATE DEHYDROGENASE: LDH: 270 U/L — ABNORMAL HIGH (ref 105–235)

## 2024-03-24 LAB — VITAMIN B12: Vitamin B-12: 549 pg/mL (ref 180–914)

## 2024-03-24 LAB — RETIC PANEL
Immature Retic Fract: 9.2 % (ref 2.3–15.9)
RBC.: 2.4 MIL/uL — ABNORMAL LOW (ref 4.22–5.81)
Retic Count, Absolute: 55.2 10*3/uL (ref 19.0–186.0)
Retic Ct Pct: 2.3 % (ref 0.4–3.1)
Reticulocyte Hemoglobin: 35.3 pg

## 2024-03-24 LAB — SAMPLE TO BLOOD BANK

## 2024-03-24 LAB — DIRECT ANTIGLOBULIN TEST (NOT AT ARMC)
DAT, IgG: NEGATIVE
DAT, complement: NEGATIVE

## 2024-03-24 NOTE — Progress Notes (Signed)
 [facility] CONSULT NOTE  Patient Care Team: [name], MD as PCP - General (Family Medicine)  CHIEF COMPLAINTS/PURPOSE OF CONSULTATION:  Pancytopenia. Assessment & Plan  Symptomatic anemia Recurrent anemia with hemoglobin at 7.3 g/dL. Etiology unclear; differential includes autoimmune hemolytic anemia, reaction to influenza vaccination, and hereditary ovalocytosis. No current transfusion needed. - Ordered repeat CBC and Coombs test to evaluate hemolysis and autoimmune etiology. No hemolysis noted. Coombs test not drawn - Prepared for possible transfusion if symptoms worsen or hemoglobin declines by week's end. - It appears that his creatinine is elevated again as well.  - He had CT chest, abdomen, pelvis, bone marrow biopsy, no indication of primary hematologic disorder. In the past we evaluated for nutritional def, myeloma labs, G6PD, pyruvate kinase deficiency, negative. - FU this week with labs. - Encouraged him to continue with nephrology.   HISTORY OF PRESENTING ILLNESS:  Kenneth Lawson 79 y.o. male is here because of Pancytopenia.  History of Present Illness  Kenneth Lawson is a 79 year old male with autoimmune hemolytic anemia and hereditary ovalocytosis who presents with recurrent symptomatic anemia and acute hemoglobin decline.  Hemoglobin acutely decreased to 7.3 g/dL today from a recent peak of 10.4 g/dL. He denies melena, hematochezia, or other overt sources of bleeding. He is closely monitoring for gastrointestinal bleeding and is under the care of a gastroenterologist. No abnormal bruising is reported.  He denies recent illness, new medications, or changes in health status in the past two weeks, aside from initiation of thyroid  medication and a recent influenza vaccination. Following the flu shot, he experienced transient myalgias and a single episode of rigors without seizure activity. He has not received recent B12 injections, and his last B12  level was within normal limits. Previous laboratory evaluation in late December showed no evidence of hemolysis.  He reports increased fatigue, reduced appetite, and some weight loss, which he attributes to mood changes including anger and depression. He recently completed a brisk one-mile walk without difficulty. He continues regular therapy for mood symptoms and remains on stable psychiatric medications. Gabapentin  has not been restarted, and he uses acetaminophen  infrequently, with rare use of tramadol.  Rest of the pertinent 10 point ROS reviewed and neg.   MEDICAL HISTORY:  Past Medical History:  Diagnosis Date   Arthritis    Diabetes mellitus without complication (HCC)    Hypercholesterolemia    Hypertension     SURGICAL HISTORY: Past Surgical History:  Procedure Laterality Date   IR BONE MARROW BIOPSY & ASPIRATION  12/17/2023   LAPARASCOPIC MEDICAN ARCUATE LIGAMENT RELEASE     MINOR HEMORRHOIDECTOMY N/A     SOCIAL HISTORY: Social History   Socioeconomic History   Marital status: Married    Spouse name: Not on file   Number of children: Not on file   Years of education: Not on file   Highest education level: Not on file  Occupational History   Not on file  Tobacco Use   Smoking status: Former    Types: Cigarettes   Smokeless tobacco: Never  Vaping Use   Vaping status: Not on file  Substance and Sexual Activity   Alcohol use: Yes    Comment: rarely   Drug use: Never   Sexual activity: Not on file  Other Topics Concern   Not on file  Social History Narrative   Pt lives with wife    Retired    Social Drivers of Health   Tobacco Use: Low Risk (01/13/2024)   Received  from [facility]   PCARE Tobacco SDOH    Current tobacco Use: No    PCare Tobacco SDOH: Not on file  Recent Concern: Tobacco Use - Medium Risk (12/30/2023)   Patient History    Smoking Tobacco Use: Former    Smokeless Tobacco Use: Never    Passive Exposure: Not on Programmer, Applications Strain: Not on file  Food Insecurity: No Food Insecurity (12/14/2023)   Epic    Worried About Programme Researcher, Broadcasting/film/video in the Last Year: Never true    Ran Out of Food in the Last Year: Never true  Transportation Needs: No Transportation Needs (12/14/2023)   Epic    Lack of Transportation (Medical): No    Lack of Transportation (Non-Medical): No  Physical Activity: Low Risk (01/13/2024)   Received from [facility]   PCARE Exercise SDOH    Exercise: Aerobic    PCare Exercise SDOH: Not on file    PCare Exercise SDOH: Not on file  Stress: Not on file  Social Connections: Moderately Integrated (12/14/2023)   Social Connection and Isolation Panel    Frequency of Communication with Friends and Family: More than three times a week    Frequency of Social Gatherings with Friends and Family: More than three times a week    Attends Religious Services: More than 4 times per year    Active Member of [employer] or Organizations: No    Attends Banker Meetings: Never    Marital Status: Married  Catering Manager Violence: Not At Risk (12/15/2023)   Epic    Fear of Current or Ex-Partner: No    Emotionally Abused: No    Physically Abused: No    Sexually Abused: No  Depression (PHQ2-9): Low Risk (03/24/2024)   Depression (PHQ2-9)    PHQ-2 Score: 0  Alcohol Screen: Not on file  Housing: Low Risk (12/14/2023)   Epic    Unable to Pay for Housing in the Last Year: No    Number of Times Moved in the Last Year: 1    Homeless in the Last Year: No  Utilities: Not At Risk (12/14/2023)   Epic    Threatened with loss of utilities: No  Health Literacy: Not on file    FAMILY HISTORY: Family History  Problem Relation Age of Onset   Alzheimer's disease Mother    Cancer Neg Hx    Parkinson's disease Neg Hx    Tremor Neg Hx     ALLERGIES:  is allergic to metformin.  MEDICATIONS:  Current Outpatient Medications  Medication Sig Dispense Refill   ACCU-CHEK  GUIDE test strip USE TO CHECK BLOOD SUGARS THREE TIMES DAILY. 100 strip 2   Accu-Chek Softclix Lancets lancets 3 times daily As directed DXE11.65 200 each 3   alfuzosin  (UROXATRAL ) 10 MG 24 hr tablet Take 10 mg by mouth daily.     [Paused] Alogliptin -Pioglitazone  25-30 MG TABS Take 1 tablet by mouth daily. (Patient taking differently: Take 1 tablet by mouth daily. TAKE 1 TABLET BY MOUTH EVERY DAY) 90 tablet 3   ALPRAZolam  (XANAX ) 0.5 MG tablet Take 0.5 mg by mouth every other day as needed for anxiety. Take 1/2 tablet by mouth every other day as needed.     atorvastatin  (LIPITOR) 80 MG tablet Take 0.5 tablets (40 mg total) by mouth daily. 90 tablet 3   buPROPion  (WELLBUTRIN  XL) 300 MG 24 hr tablet Take 300 mg by mouth daily.     busPIRone  (  BUSPAR ) 10 MG tablet Take 5-10 mg by mouth 2 (two) times daily as needed (Anxiety). TAKE 1/2 TO 1 TABLET TWICE A DAY AS NEEDED FOR ANXIETY     cyanocobalamin  (VITAMIN B12) 1000 MCG/ML injection Inject 1 mL (1,000 mcg total) into the muscle once a week. 1 mL 7   Eluxadoline  (VIBERZI ) 75 MG TABS Take 75 mg by mouth 2 (two) times daily.     finasteride  (PROSCAR ) 5 MG tablet Take 5 mg by mouth daily.     [Paused] gabapentin  (NEURONTIN ) 300 MG capsule Take 300 mg by mouth See admin instructions. 1 CAPSULE THREE TIMES DAILY WITH ONE EXTRA AT NIGHT AS NEEDED. ORALLY ONCE A DAY     [Paused] glimepiride  (AMARYL ) 4 MG tablet TAKE 1 TABLET AT BREAKFAST AND TAKE 1/2  AT LUNCH IF EATING A MEAL, OTHERWISE SKIP (Patient taking differently: Take 4 mg by mouth See admin instructions. TAKE 1 TABLET AT BREAKFAST AND TAKE 1/2  AT LUNCH IF EATING A MEAL, OTHERWISE SKIP) 180 tablet 0   mometasone (NASONEX) 50 MCG/ACT nasal spray Place 2 sprays into the nose daily as needed (Sinus).  5   NEEDLE, DISP, 23 G (BD DISP NEEDLE) 23G X 1 MISC Brand substitution OK 8 each 0   [Paused] Semaglutide  (RYBELSUS ) 7 MG TABS TAKE 1 TABLET DAILY BEFORE BREAKFAST WITH HALF GLASS  OF WATER . (Patient taking  differently: Take 7 mg by mouth daily. TAKE 1 TABLET DAILY BEFORE BREAKFAST WITH HALF GLASS  OF WATER .) 90 tablet 1   tadalafil (CIALIS) 5 MG tablet Take 5 mg by mouth daily as needed for erectile dysfunction.     Vilazodone  HCl 20 MG TABS Take 20 mg by mouth daily.     No current facility-administered medications for this visit.     PHYSICAL EXAMINATION: ECOG PERFORMANCE STATUS: 0 - Asymptomatic  Vitals:   03/24/24 1157  BP: 130/62  Pulse: 77  Resp: 17  Temp: 98 F (36.7 C)  SpO2: 92%     Filed Weights   03/24/24 1157  Weight: 159 lb 4.8 oz (72.3 kg)      GENERAL:alert, no distress and comfortable, Halitosis No Cervical lymphadenopathy, axillary adenopathy CTA bilaterally RRR No LE edema.  LABORATORY DATA:  I have reviewed the data as listed Lab Results  Component Value Date   WBC 4.8 03/24/2024   HGB 7.3 (L) 03/24/2024   HCT 23.2 (L) 03/24/2024   MCV 93.9 03/24/2024   PLT 154 03/24/2024     Chemistry      Component Value Date/Time   NA 141 02/24/2024 1239   NA 140 02/13/2019 1558   K 4.3 02/24/2024 1239   CL 109 02/24/2024 1239   CO2 23 02/24/2024 1239   BUN 26 (H) 02/24/2024 1239   BUN 19 02/13/2019 1558   CREATININE 1.38 (H) 02/24/2024 1239      Component Value Date/Time   CALCIUM  9.2 02/24/2024 1239   ALKPHOS 69 02/24/2024 1239   AST 21 02/24/2024 1239   ALT 27 02/24/2024 1239   BILITOT 0.6 02/24/2024 1239       RADIOGRAPHIC STUDIES: I have personally reviewed the radiological images as listed and agreed with the findings in the report. No results found.    All questions were answered. The patient knows to call the clinic with any problems, questions or concerns. I spent 30 minutes in the care of this patient including H and P, review of records, counseling and coordination of care.     Gwendolyn Nishi  Gabreille Dardis, MD 03/24/2024 12:10 PM

## 2024-03-24 NOTE — Progress Notes (Signed)
 [facility] CONSULT NOTE  Patient Care Team: [name], MD as PCP - General (Family Medicine)  CHIEF COMPLAINTS/PURPOSE OF CONSULTATION:  Pancytopenia. Assessment & Plan  Mild hemolytic anemia Chronic, compensated hemolytic anemia with stable parameters and no evidence of hematologic malignancy. Differential includes hereditary red cell membrane or enzyme defects and environmental or dietary triggers. - Ordered G6PD and pyruvate kinase assay today. - Requested pathologist review of peripheral smear, he had some ovalocytes last smear. - Advised avoidance of hemolysis triggers including alkaline water , quinine, strenuous exercise, dehydration, and new medications. - CBC today with back to baseline Hb which is slightly over 10. - Scheduled follow-up in one month. - Referred to [facility] for second opinion; pending scheduling.  Mild thrombocytopenia Chronic, mild thrombocytopenia with stable laboratory findings. - Monitor platelet counts at follow-up visits. - BMB with no primary hematologic process - CT imaging with no evidence of lymphadenopathy concerning for lymphoma - Mild splenomegaly noted.   HISTORY OF PRESENTING ILLNESS:  Kenneth Lawson 79 y.o. male is here because of Pancytopenia.  History of Present Illness  Kenneth Lawson is a 79 year old male with ? Acute on chronic hemolytic anemia and thrombocytopenia who presents for hematology follow-up to evaluate ongoing hemolysis and review recent laboratory and imaging results.  He and his caregiver have eliminated potential hemolysis triggers, including discontinuing alkaline water  and ginger ales containing quinine. He recently increased physical activity during travel, including extensive walking and hill climbing, which may have contributed to hemolytic episodes.  He expresses frustration with frequent medical appointments and follow-up with multiple specialists, but is pleased with recent  improvement in laboratory results and reassuring imaging findings.  Rest of the pertinent 10 point ROS reviewed and neg.   MEDICAL HISTORY:  Past Medical History:  Diagnosis Date   Arthritis    Diabetes mellitus without complication (HCC)    Hypercholesterolemia    Hypertension     SURGICAL HISTORY: Past Surgical History:  Procedure Laterality Date   IR BONE MARROW BIOPSY & ASPIRATION  12/17/2023   LAPARASCOPIC MEDICAN ARCUATE LIGAMENT RELEASE     MINOR HEMORRHOIDECTOMY N/A     SOCIAL HISTORY: Social History   Socioeconomic History   Marital status: Married    Spouse name: Not on file   Number of children: Not on file   Years of education: Not on file   Highest education level: Not on file  Occupational History   Not on file  Tobacco Use   Smoking status: Former    Types: Cigarettes   Smokeless tobacco: Never  Vaping Use   Vaping status: Not on file  Substance and Sexual Activity   Alcohol use: Yes    Comment: rarely   Drug use: Never   Sexual activity: Not on file  Other Topics Concern   Not on file  Social History Narrative   Pt lives with wife    Retired    Social Drivers of Health   Tobacco Use: Low Risk (01/13/2024)   Received from [facility]   PCARE Tobacco SDOH    Current tobacco Use: No    PCare Tobacco SDOH: Not on file  Recent Concern: Tobacco Use - Medium Risk (12/30/2023)   Patient History    Smoking Tobacco Use: Former    Smokeless Tobacco Use: Never    Passive Exposure: Not on Actuary Strain: Not on file  Food Insecurity: No Food Insecurity (12/14/2023)   Epic    Worried  About Running Out of Food in the Last Year: Never true    Ran Out of Food in the Last Year: Never true  Transportation Needs: No Transportation Needs (12/14/2023)   Epic    Lack of Transportation (Medical): No    Lack of Transportation (Non-Medical): No  Physical Activity: Low Risk (01/13/2024)   Received from [facility]   PCARE Exercise SDOH    Exercise: Aerobic    PCare Exercise SDOH: Not on file    PCare Exercise SDOH: Not on file  Stress: Not on file  Social Connections: Moderately Integrated (12/14/2023)   Social Connection and Isolation Panel    Frequency of Communication with Friends and Family: More than three times a week    Frequency of Social Gatherings with Friends and Family: More than three times a week    Attends Religious Services: More than 4 times per year    Active Member of [employer] or Organizations: No    Attends Banker Meetings: Never    Marital Status: Married  Catering Manager Violence: Not At Risk (12/15/2023)   Epic    Fear of Current or Ex-Partner: No    Emotionally Abused: No    Physically Abused: No    Sexually Abused: No  Depression (PHQ2-9): Low Risk (03/24/2024)   Depression (PHQ2-9)    PHQ-2 Score: 0  Alcohol Screen: Not on file  Housing: Low Risk (12/14/2023)   Epic    Unable to Pay for Housing in the Last Year: No    Number of Times Moved in the Last Year: 1    Homeless in the Last Year: No  Utilities: Not At Risk (12/14/2023)   Epic    Threatened with loss of utilities: No  Health Literacy: Not on file    FAMILY HISTORY: Family History  Problem Relation Age of Onset   Alzheimer's disease Mother    Cancer Neg Hx    Parkinson's disease Neg Hx    Tremor Neg Hx     ALLERGIES:  is allergic to metformin.  MEDICATIONS:  Current Outpatient Medications  Medication Sig Dispense Refill   ACCU-CHEK GUIDE test strip USE TO CHECK BLOOD SUGARS THREE TIMES DAILY. 100 strip 2   Accu-Chek Softclix Lancets lancets 3 times daily As directed DXE11.65 200 each 3   alfuzosin  (UROXATRAL ) 10 MG 24 hr tablet Take 10 mg by mouth daily.     [Paused] Alogliptin -Pioglitazone  25-30 MG TABS Take 1 tablet by mouth daily. (Patient taking differently: Take 1 tablet by mouth daily. TAKE 1 TABLET BY MOUTH EVERY DAY) 90 tablet 3   ALPRAZolam  (XANAX ) 0.5 MG  tablet Take 0.5 mg by mouth every other day as needed for anxiety. Take 1/2 tablet by mouth every other day as needed.     atorvastatin  (LIPITOR) 80 MG tablet Take 0.5 tablets (40 mg total) by mouth daily. 90 tablet 3   buPROPion  (WELLBUTRIN  XL) 300 MG 24 hr tablet Take 300 mg by mouth daily.     busPIRone  (BUSPAR ) 10 MG tablet Take 5-10 mg by mouth 2 (two) times daily as needed (Anxiety). TAKE 1/2 TO 1 TABLET TWICE A DAY AS NEEDED FOR ANXIETY     cyanocobalamin  (VITAMIN B12) 1000 MCG/ML injection Inject 1 mL (1,000 mcg total) into the muscle once a week. 1 mL 7   Eluxadoline  (VIBERZI ) 75 MG TABS Take 75 mg by mouth 2 (two) times daily.     finasteride  (PROSCAR ) 5 MG tablet Take 5 mg by  mouth daily.     [Paused] gabapentin  (NEURONTIN ) 300 MG capsule Take 300 mg by mouth See admin instructions. 1 CAPSULE THREE TIMES DAILY WITH ONE EXTRA AT NIGHT AS NEEDED. ORALLY ONCE A DAY     [Paused] glimepiride  (AMARYL ) 4 MG tablet TAKE 1 TABLET AT BREAKFAST AND TAKE 1/2  AT LUNCH IF EATING A MEAL, OTHERWISE SKIP (Patient taking differently: Take 4 mg by mouth See admin instructions. TAKE 1 TABLET AT BREAKFAST AND TAKE 1/2  AT LUNCH IF EATING A MEAL, OTHERWISE SKIP) 180 tablet 0   mometasone (NASONEX) 50 MCG/ACT nasal spray Place 2 sprays into the nose daily as needed (Sinus).  5   NEEDLE, DISP, 23 G (BD DISP NEEDLE) 23G X 1 MISC Brand substitution OK 8 each 0   [Paused] Semaglutide  (RYBELSUS ) 7 MG TABS TAKE 1 TABLET DAILY BEFORE BREAKFAST WITH HALF GLASS  OF WATER . (Patient taking differently: Take 7 mg by mouth daily. TAKE 1 TABLET DAILY BEFORE BREAKFAST WITH HALF GLASS  OF WATER .) 90 tablet 1   tadalafil (CIALIS) 5 MG tablet Take 5 mg by mouth daily as needed for erectile dysfunction.     Vilazodone  HCl 20 MG TABS Take 20 mg by mouth daily.     No current facility-administered medications for this visit.     PHYSICAL EXAMINATION: ECOG PERFORMANCE STATUS: 0 - Asymptomatic  Vitals:   03/24/24 1157  BP:  130/62  Pulse: 77  Resp: 17  Temp: 98 F (36.7 C)  SpO2: 92%     Filed Weights   03/24/24 1157  Weight: 159 lb 4.8 oz (72.3 kg)      GENERAL:alert, no distress and comfortable,   LABORATORY DATA:  I have reviewed the data as listed Lab Results  Component Value Date   WBC 4.8 03/24/2024   HGB 7.3 (L) 03/24/2024   HCT 23.2 (L) 03/24/2024   MCV 93.9 03/24/2024   PLT 154 03/24/2024     Chemistry      Component Value Date/Time   NA 141 02/24/2024 1239   NA 140 02/13/2019 1558   K 4.3 02/24/2024 1239   CL 109 02/24/2024 1239   CO2 23 02/24/2024 1239   BUN 26 (H) 02/24/2024 1239   BUN 19 02/13/2019 1558   CREATININE 1.38 (H) 02/24/2024 1239      Component Value Date/Time   CALCIUM  9.2 02/24/2024 1239   ALKPHOS 69 02/24/2024 1239   AST 21 02/24/2024 1239   ALT 27 02/24/2024 1239   BILITOT 0.6 02/24/2024 1239       RADIOGRAPHIC STUDIES: I have personally reviewed the radiological images as listed and agreed with the findings in the report. No results found.    All questions were answered. The patient knows to call the clinic with any problems, questions or concerns. I spent 30 minutes in the care of this patient including H and P, review of records, counseling and coordination of care.     Amber Stalls, MD 03/24/2024 12:09 PM

## 2024-03-25 ENCOUNTER — Ambulatory Visit: Payer: Self-pay | Admitting: Hematology and Oncology

## 2024-03-26 ENCOUNTER — Inpatient Hospital Stay

## 2024-03-26 ENCOUNTER — Other Ambulatory Visit: Payer: Self-pay

## 2024-03-26 ENCOUNTER — Other Ambulatory Visit: Payer: Self-pay | Admitting: *Deleted

## 2024-03-26 DIAGNOSIS — D649 Anemia, unspecified: Secondary | ICD-10-CM

## 2024-03-26 DIAGNOSIS — C7951 Secondary malignant neoplasm of bone: Secondary | ICD-10-CM

## 2024-03-26 DIAGNOSIS — D61818 Other pancytopenia: Secondary | ICD-10-CM | POA: Diagnosis not present

## 2024-03-26 LAB — PREPARE RBC (CROSSMATCH)

## 2024-03-26 MED ORDER — DIPHENHYDRAMINE HCL 25 MG PO CAPS
25.0000 mg | ORAL_CAPSULE | Freq: Once | ORAL | Status: AC
  Administered 2024-03-26: 25 mg via ORAL
  Filled 2024-03-26: qty 1

## 2024-03-26 MED ORDER — ACETAMINOPHEN 325 MG PO TABS
650.0000 mg | ORAL_TABLET | Freq: Once | ORAL | Status: AC
  Administered 2024-03-26: 650 mg via ORAL
  Filled 2024-03-26: qty 2

## 2024-03-26 MED ORDER — SODIUM CHLORIDE 0.9% FLUSH: 10.0000 mL | INTRAVENOUS | Status: DC | PRN

## 2024-03-26 MED ORDER — SODIUM CHLORIDE 0.9% IV SOLUTION
250.0000 mL | INTRAVENOUS | Status: DC
  Administered 2024-03-26: 250 mL via INTRAVENOUS

## 2024-03-26 NOTE — Telephone Encounter (Signed)
-----   Message from Amber Stalls, MD sent at 03/25/2024  8:12 PM EST ----- His coomb's test is negative. So there is no evidence of immune mediated hemolysis. I tried to reach out to nephrology MD but his doc may be OOO. He will be seeing Morna this week. I asked Ms  Massman to call the nephrology office and have him scheduled to see them back. They might consider EPO. I want him to have a follow up next week with labs again with me or APP. Can we also arrange  that. For labs, he def needs sample to blood bank, CBC and CMP before next week's visit.

## 2024-03-26 NOTE — Telephone Encounter (Signed)
 Per MD pt's spouse contacted to schedule appt for next week for labs and f/u w/ Iruku. Pt's spouse verbalized understanding.

## 2024-03-27 ENCOUNTER — Inpatient Hospital Stay

## 2024-03-27 ENCOUNTER — Inpatient Hospital Stay: Admitting: Adult Health

## 2024-03-27 ENCOUNTER — Encounter: Payer: Self-pay | Admitting: Adult Health

## 2024-03-27 VITALS — BP 134/62 | HR 70 | Temp 98.6°F | Resp 18 | Ht 68.0 in | Wt 160.4 lb

## 2024-03-27 DIAGNOSIS — D61818 Other pancytopenia: Secondary | ICD-10-CM | POA: Diagnosis not present

## 2024-03-27 DIAGNOSIS — D649 Anemia, unspecified: Secondary | ICD-10-CM

## 2024-03-27 LAB — CBC WITH DIFFERENTIAL/PLATELET
Abs Immature Granulocytes: 0.03 10*3/uL (ref 0.00–0.07)
Basophils Absolute: 0.1 10*3/uL (ref 0.0–0.1)
Basophils Relative: 1 %
Eosinophils Absolute: 0.1 10*3/uL (ref 0.0–0.5)
Eosinophils Relative: 2 %
HCT: 25 % — ABNORMAL LOW (ref 39.0–52.0)
Hemoglobin: 8.3 g/dL — ABNORMAL LOW (ref 13.0–17.0)
Immature Granulocytes: 1 %
Lymphocytes Relative: 25 %
Lymphs Abs: 1.1 10*3/uL (ref 0.7–4.0)
MCH: 30.6 pg (ref 26.0–34.0)
MCHC: 33.2 g/dL (ref 30.0–36.0)
MCV: 92.3 fL (ref 80.0–100.0)
Monocytes Absolute: 0.4 10*3/uL (ref 0.1–1.0)
Monocytes Relative: 8 %
Neutro Abs: 2.8 10*3/uL (ref 1.7–7.7)
Neutrophils Relative %: 63 %
Platelets: 158 10*3/uL (ref 150–400)
RBC: 2.71 MIL/uL — ABNORMAL LOW (ref 4.22–5.81)
RDW: 14.5 % (ref 11.5–15.5)
WBC: 4.3 10*3/uL (ref 4.0–10.5)
nRBC: 0 % (ref 0.0–0.2)

## 2024-03-27 LAB — TYPE AND SCREEN
ABO/RH(D): A NEG
Antibody Screen: NEGATIVE
Unit division: 0

## 2024-03-27 LAB — CMP (CANCER CENTER ONLY)
ALT: 18 U/L (ref 0–44)
AST: 19 U/L (ref 15–41)
Albumin: 4.3 g/dL (ref 3.5–5.0)
Alkaline Phosphatase: 71 U/L (ref 38–126)
Anion gap: 10 (ref 5–15)
BUN: 32 mg/dL — ABNORMAL HIGH (ref 8–23)
CO2: 23 mmol/L (ref 22–32)
Calcium: 9.1 mg/dL (ref 8.9–10.3)
Chloride: 109 mmol/L (ref 98–111)
Creatinine: 1.99 mg/dL — ABNORMAL HIGH (ref 0.61–1.24)
GFR, Estimated: 34 mL/min — ABNORMAL LOW
Glucose, Bld: 90 mg/dL (ref 70–99)
Potassium: 4.8 mmol/L (ref 3.5–5.1)
Sodium: 143 mmol/L (ref 135–145)
Total Bilirubin: 0.8 mg/dL (ref 0.0–1.2)
Total Protein: 7 g/dL (ref 6.5–8.1)

## 2024-03-27 LAB — BPAM RBC
Blood Product Expiration Date: 202602012359
ISSUE DATE / TIME: 202601291245
Unit Type and Rh: 600

## 2024-03-27 LAB — TSH: TSH: 3.26 u[IU]/mL (ref 0.350–4.500)

## 2024-03-27 LAB — VITAMIN B12: Vitamin B-12: 497 pg/mL (ref 180–914)

## 2024-03-30 LAB — HAPTOGLOBIN: Haptoglobin: <10 mg/dL — ABNORMAL LOW (ref 34–355)

## 2024-03-30 NOTE — Progress Notes (Signed)
 [facility] Cancer Follow up:    Kenneth Cole, MD [address] [place] [address]   DIAGNOSIS: refractory anemia   SUMMARY OF HEMATOLOGIC HISTORY: Copied from hospital d/c summary from 12/17/2023:    Was evaluated by Hematology 9 months ago for progressive mild pancytopenia (WBC 3.1, Hgb 9, Plts 125), but folate, SPEP, flow cytometry, and ANA were normal.  His white count and platelets at the time of hematology evaluation had resolved by themselves, B12 was slightly low, TSH slightly high, and kappa free light chains was only minimally elevated, so he was started on B12 supplementation and BMB was not pursued.  Over the next 6 months, he was asymptomatic, had some slight improvement in all indices, and so BMB was deferred in favor of continued surveillance.   Hgb here 6.2 g/dL on presentation.  FOBT negative, no report of GI bleeding.   Haptoglobin low but T. bili normal, LDH only marginally elevated, smear showed no schistocytes, and reticulocytes inappropriately low.  RI 0.5.     Iron and B12 replete.   SPEP showed no M spike, FLC's both elevated consistent with AKI.   Hepatitis serologies negative, ASO only marginally elevated, ANCA, ANA, complements, and GBM negative.   BMB completed 12/17/2023 demonstrating normocellular marrow with trilineage hematopoiesis   CURRENT THERAPY: intermittent transfusion  INTERVAL HISTORY:  Discussed the use of AI scribe software for clinical note transcription with the patient, who gave verbal consent to proceed.  History of Present Illness Kenneth Lawson is a 79 year old male with recurrent symptomatic anemia who presents for follow-up evaluation.  He has recurrent symptomatic anemia with unclear cause despite extensive laboratory evaluation. Bone marrow biopsy in October 2025 showed markedly hypercellular marrow with trilineage hematopoiesis and no hematologic malignancy. Nephrology is following his  renal function and advised maintaining hydration.  He received a blood transfusion yesterday. His hemoglobin improved to 8.3 g/dL. He tolerated the transfusion well with only transient sleepiness and no complications. He currently feels well and denies abnormal bleeding, bruising, or new masses.     Patient Active Problem List   Diagnosis Date Noted   Hypoxemia 12/14/2023   Thrombocytopenia 12/14/2023   Type 2 diabetes mellitus (HCC) 12/14/2023   AKI (acute kidney injury) 12/13/2023   Symptomatic anemia 04/09/2023   Chronic low back pain 12/17/2017   Depression 03/21/2013   Type II or unspecified type diabetes mellitus without mention of complication, uncontrolled 09/15/2012   Pure hypercholesterolemia 09/15/2012   Benign prostatic hyperplasia 09/15/2012    is allergic to metformin.  MEDICAL HISTORY: Past Medical History:  Diagnosis Date   Arthritis    Diabetes mellitus without complication (HCC)    Hypercholesterolemia    Hypertension     SURGICAL HISTORY: Past Surgical History:  Procedure Laterality Date   IR BONE MARROW BIOPSY & ASPIRATION  12/17/2023   LAPARASCOPIC MEDICAN ARCUATE LIGAMENT RELEASE     MINOR HEMORRHOIDECTOMY N/A     SOCIAL HISTORY: Social History   Socioeconomic History   Marital status: Married    Spouse name: Not on file   Number of children: Not on file   Years of education: Not on file   Highest education level: Not on file  Occupational History   Not on file  Tobacco Use   Smoking status: Former    Types: Cigarettes   Smokeless tobacco: Never  Vaping Use   Vaping status: Not on file  Substance and Sexual Activity   Alcohol use: Yes  Comment: rarely   Drug use: Never   Sexual activity: Not on file  Other Topics Concern   Not on file  Social History Narrative   Pt lives with wife    Retired    Social Drivers of Health   Tobacco Use: Medium Risk (03/27/2024)   Patient History    Smoking Tobacco Use: Former    Smokeless  Tobacco Use: Never    Passive Exposure: Not on Actuary Strain: Not on file  Food Insecurity: No Food Insecurity (12/14/2023)   Epic    Worried About Programme Researcher, Broadcasting/film/video in the Last Year: Never true    Ran Out of Food in the Last Year: Never true  Transportation Needs: No Transportation Needs (12/14/2023)   Epic    Lack of Transportation (Medical): No    Lack of Transportation (Non-Medical): No  Physical Activity: Low Risk (01/13/2024)   Received from [facility]   PCARE Exercise SDOH    Exercise: Aerobic    PCare Exercise SDOH: Not on file    PCare Exercise SDOH: Not on file  Stress: Not on file  Social Connections: Moderately Integrated (12/14/2023)   Social Connection and Isolation Panel    Frequency of Communication with Friends and Family: More than three times a week    Frequency of Social Gatherings with Friends and Family: More than three times a week    Attends Religious Services: More than 4 times per year    Active Member of [employer] or Organizations: No    Attends Banker Meetings: Never    Marital Status: Married  Catering Manager Violence: Not At Risk (12/15/2023)   Epic    Fear of Current or Ex-Partner: No    Emotionally Abused: No    Physically Abused: No    Sexually Abused: No  Depression (PHQ2-9): Low Risk (03/26/2024)   Depression (PHQ2-9)    PHQ-2 Score: 0  Alcohol Screen: Not on file  Housing: Low Risk (12/14/2023)   Epic    Unable to Pay for Housing in the Last Year: No    Number of Times Moved in the Last Year: 1    Homeless in the Last Year: No  Utilities: Not At Risk (12/14/2023)   Epic    Threatened with loss of utilities: No  Health Literacy: Not on file    FAMILY HISTORY: Family History  Problem Relation Age of Onset   Alzheimer's disease Mother    Cancer Neg Hx    Parkinson's disease Neg Hx    Tremor Neg Hx     Review of Systems  Constitutional:  Positive for fatigue. Negative for appetite  change, chills, fever and unexpected weight change.  HENT:   Negative for hearing loss, lump/mass and trouble swallowing.   Eyes:  Negative for eye problems and icterus.  Respiratory:  Negative for chest tightness, cough and shortness of breath.   Cardiovascular:  Negative for chest pain, leg swelling and palpitations.  Gastrointestinal:  Negative for abdominal distention, abdominal pain, constipation, diarrhea, nausea and vomiting.  Endocrine: Negative for hot flashes.  Genitourinary:  Negative for difficulty urinating.   Musculoskeletal:  Negative for arthralgias.  Skin:  Negative for itching and rash.  Neurological:  Negative for dizziness, extremity weakness, headaches and numbness.  Hematological:  Negative for adenopathy. Does not bruise/bleed easily.  Psychiatric/Behavioral:  Negative for depression. The patient is not nervous/anxious.       PHYSICAL EXAMINATION    Vitals:  03/27/24 1307  BP: 134/62  Pulse: 70  Resp: 18  Temp: 98.6 F (37 C)  SpO2: 98%    Physical Exam Constitutional:      General: He is not in acute distress.    Appearance: Normal appearance. He is not toxic-appearing.  HENT:     Head: Normocephalic and atraumatic.     Mouth/Throat:     Mouth: Mucous membranes are moist.     Pharynx: Oropharynx is clear. No oropharyngeal exudate or posterior oropharyngeal erythema.  Eyes:     General: No scleral icterus. Cardiovascular:     Rate and Rhythm: Normal rate and regular rhythm.     Pulses: Normal pulses.     Heart sounds: Normal heart sounds.  Pulmonary:     Effort: Pulmonary effort is normal.     Breath sounds: Normal breath sounds.  Abdominal:     General: Abdomen is flat. Bowel sounds are normal. There is no distension.     Palpations: Abdomen is soft.     Tenderness: There is no abdominal tenderness.  Musculoskeletal:        General: No swelling.     Cervical back: Neck supple.  Lymphadenopathy:     Cervical: No cervical adenopathy.   Skin:    General: Skin is warm and dry.     Findings: No rash.  Neurological:     General: No focal deficit present.     Mental Status: He is alert.  Psychiatric:        Mood and Affect: Mood normal.        Behavior: Behavior normal.     LABORATORY DATA:  CBC    Component Value Date/Time   WBC 4.3 03/27/2024 1245   RBC 2.71 (L) 03/27/2024 1245   HGB 8.3 (L) 03/27/2024 1245   HGB 10.4 (L) 02/24/2024 1239   HCT 25.0 (L) 03/27/2024 1245   HCT 30.8 (L) 04/09/2023 1557   PLT 158 03/27/2024 1245   PLT 129 (L) 12/27/2023 0925   MCV 92.3 03/27/2024 1245   MCH 30.6 03/27/2024 1245   MCHC 33.2 03/27/2024 1245   RDW 14.5 03/27/2024 1245   LYMPHSABS 1.1 03/27/2024 1245   MONOABS 0.4 03/27/2024 1245   EOSABS 0.1 03/27/2024 1245   BASOSABS 0.1 03/27/2024 1245    CMP     Component Value Date/Time   NA 143 03/27/2024 1245   NA 140 02/13/2019 1558   K 4.8 03/27/2024 1245   CL 109 03/27/2024 1245   CO2 23 03/27/2024 1245   GLUCOSE 90 03/27/2024 1245   BUN 32 (H) 03/27/2024 1245   BUN 19 02/13/2019 1558   CREATININE 1.99 (H) 03/27/2024 1245   CALCIUM  9.1 03/27/2024 1245   PROT 7.0 03/27/2024 1245   ALBUMIN 4.3 03/27/2024 1245   AST 19 03/27/2024 1245   ALT 18 03/27/2024 1245   ALKPHOS 71 03/27/2024 1245   BILITOT 0.8 03/27/2024 1245   GFRNONAA 34 (L) 03/27/2024 1245   GFRAA 105 02/13/2019 1558     ASSESSMENT and THERAPY PLAN:     Assessment and Plan Assessment & Plan Symptomatic anemia Recurrent symptomatic anemia with improved hemoglobin post-transfusion. Bone marrow biopsy showed hypercellular marrow with trilineage hematopoiesis. Etiology unclear; differential includes anemia of chronic disease. - Reviewed hemoglobin and transfusion response. - Discussed bone marrow biopsy results and blood count surveillance. - Sent lab results to nephrology. - Monitor hemoglobin and platelets. - Scheduled follow-up next week for lab assessment and reassessment.  Chronic  kidney disease Chronic  kidney disease with prior acute kidney injury, potential progression. Evaluating impact on anemia due to impaired renal erythropoietin production. Awaiting nephrology input on chronicity and erythropoietin therapy. - Coordinated with nephrology (Dr. Dalene) for renal function and potential erythropoietin therapy. - Sent lab results to nephrology. - Scheduled nephrology follow-up in February. - Ensured hydration per nephrology recommendation. - Included endocrinology and primary care in care coordination. - Monitor renal function and adjust management as indicated.  RTC in 1 week for labs and f/u with Dr. Loretha.   All questions were answered. The patient knows to call the clinic with any problems, questions or concerns. We can certainly see the patient much sooner if necessary.  Total encounter time:20 minutes*in face-to-face visit time, chart review, lab review, care coordination, order entry, and documentation of the encounter time.    Morna Kendall, NP 03/30/24 6:31 PM Medical Oncology and Hematology [facility] [address] [place], [place] [address] Tel. [phone]    Fax. [phone]  *Total Encounter Time as defined by the Centers for Medicare and Medicaid Services includes, in addition to the face-to-face time of a patient visit (documented in the note above) non-face-to-face time: obtaining and reviewing outside history, ordering and reviewing medications, tests or procedures, care coordination (communications with other health care professionals or caregivers) and documentation in the medical record.

## 2024-03-31 ENCOUNTER — Ambulatory Visit: Payer: Self-pay | Admitting: Endocrinology

## 2024-03-31 ENCOUNTER — Ambulatory Visit: Admitting: Endocrinology

## 2024-03-31 ENCOUNTER — Encounter: Payer: Self-pay | Admitting: Endocrinology

## 2024-03-31 VITALS — BP 122/52 | HR 76 | Ht 68.0 in | Wt 164.0 lb

## 2024-03-31 DIAGNOSIS — E119 Type 2 diabetes mellitus without complications: Secondary | ICD-10-CM

## 2024-03-31 DIAGNOSIS — E1165 Type 2 diabetes mellitus with hyperglycemia: Secondary | ICD-10-CM

## 2024-03-31 LAB — POCT GLYCOSYLATED HEMOGLOBIN (HGB A1C): Hemoglobin A1C: 5.3 % (ref 4.0–5.6)

## 2024-03-31 MED ORDER — GLIMEPIRIDE 4 MG PO TABS: 4.0000 mg | ORAL_TABLET | Freq: Every day | ORAL | 4 refills | Status: AC

## 2024-04-02 ENCOUNTER — Inpatient Hospital Stay: Attending: Hematology and Oncology

## 2024-04-02 ENCOUNTER — Inpatient Hospital Stay: Attending: Hematology and Oncology | Admitting: Hematology and Oncology

## 2024-04-02 ENCOUNTER — Inpatient Hospital Stay

## 2024-04-02 VITALS — BP 122/60 | HR 70 | Temp 97.3°F | Resp 18 | Ht 68.0 in | Wt 162.7 lb

## 2024-04-02 DIAGNOSIS — D61818 Other pancytopenia: Secondary | ICD-10-CM

## 2024-04-02 DIAGNOSIS — N189 Chronic kidney disease, unspecified: Secondary | ICD-10-CM | POA: Insufficient documentation

## 2024-04-02 DIAGNOSIS — D649 Anemia, unspecified: Secondary | ICD-10-CM

## 2024-04-02 DIAGNOSIS — Z862 Personal history of diseases of the blood and blood-forming organs and certain disorders involving the immune mechanism: Secondary | ICD-10-CM

## 2024-04-02 LAB — CBC WITH DIFFERENTIAL (CANCER CENTER ONLY)
Abs Immature Granulocytes: 0.01 10*3/uL (ref 0.00–0.07)
Basophils Absolute: 0 10*3/uL (ref 0.0–0.1)
Basophils Relative: 1 %
Eosinophils Absolute: 0.1 10*3/uL (ref 0.0–0.5)
Eosinophils Relative: 1 %
HCT: 24.2 % — ABNORMAL LOW (ref 39.0–52.0)
Hemoglobin: 7.9 g/dL — ABNORMAL LOW (ref 13.0–17.0)
Immature Granulocytes: 0 %
Lymphocytes Relative: 16 %
Lymphs Abs: 0.7 10*3/uL (ref 0.7–4.0)
MCH: 30.9 pg (ref 26.0–34.0)
MCHC: 32.6 g/dL (ref 30.0–36.0)
MCV: 94.5 fL (ref 80.0–100.0)
Monocytes Absolute: 0.2 10*3/uL (ref 0.1–1.0)
Monocytes Relative: 5 %
Neutro Abs: 3.5 10*3/uL (ref 1.7–7.7)
Neutrophils Relative %: 77 %
Platelet Count: 124 10*3/uL — ABNORMAL LOW (ref 150–400)
RBC: 2.56 MIL/uL — ABNORMAL LOW (ref 4.22–5.81)
RDW: 13.2 % (ref 11.5–15.5)
WBC Count: 4.5 10*3/uL (ref 4.0–10.5)
nRBC: 0 % (ref 0.0–0.2)

## 2024-04-02 LAB — TSH: TSH: 1.81 u[IU]/mL (ref 0.350–4.500)

## 2024-04-02 LAB — VITAMIN B12: Vitamin B-12: 493 pg/mL (ref 180–914)

## 2024-04-02 LAB — SAMPLE TO BLOOD BANK

## 2024-04-02 NOTE — Progress Notes (Signed)
 [facility] CONSULT NOTE  Patient Care Team: [name], MD as PCP - General (Family Medicine) Loretha Ash, MD as Consulting Physician (Hematology and Oncology) Macel Jayson PARAS, MD as Consulting Physician (Nephrology) Thapa, [place], MD as Consulting Physician (Endocrinology)  CHIEF COMPLAINTS/PURPOSE OF CONSULTATION:  Pancytopenia. Assessment & Plan  Refractory anemia secondary to chronic kidney disease?  Chronic anemia due to chronic kidney disease. Excluded malignancy and immune-mediated hemolysis.  - Ordered erythropoietin (Retacrit) injection per weight (73 kg). - Initiated therapist, occupational for home administration; plan home injections with family if approved. - Demonstrated injection technique to family. - Educated on onset of action (weeks) and maintenance dosing if responsive. - Monitor hemoglobin response; adjust therapy as needed. - Will ask if our benign heme team can see him for a second opinion - Advised continuation of supportive care and transfusions as needed.   HISTORY OF PRESENTING ILLNESS:  Kenneth Lawson 79 y.o. male is here because of Pancytopenia.  History of Present Illness  Kenneth Lawson is a 79 year old male with refractory anemia and chronic kidney disease who presents for follow-up of persistent anemia.  He has refractory anemia with hemoglobin levels fluctuating over the past several weeks. On January 29th, he received one unit of packed red blood cells, resulting in an increase in hemoglobin from 7.3 to 8.3 g/dL; as of today, hemoglobin has decreased to 7.9 g/dL. He denies new symptoms and states he feels good. No additional transfusions have been required in the past ten days. He denies abdominal pain, nausea, constipation, vomiting, flatulence, dizziness, rash, or increased pain. Weight is 162-164 lbs, slightly lower than previous baseline, with minor fluctuations. He describes a prior episode of significant weight loss  two years ago, attributed to depression and medication changes, but weight has since stabilized.  He has chronic kidney disease with ongoing nephrology follow-up and instructions to maintain hydration. No recent changes in kidney-related symptoms or management. Erythropoietin therapy was discussed as a potential intervention to stimulate erythropoiesis.  He has diabetes mellitus, recently evaluated by endocrinology with stable A1c and parameters. He continues glimepiride , though dosing has not been fully consistent. He uses Viberzi  for gastrointestinal symptoms, started approximately 1-2 years ago, coinciding with the onset of anemia. He also takes Metamucil fiber pills and uses Xanax  occasionally.  Mar 24, 2024: Evaluation for pancytopenia and recurrent symptomatic anemia; hemoglobin acutely decreased to 7.3 g/dL. Differential diagnosis included autoimmune hemolytic anemia, reaction to influenza vaccination, and hereditary ovalocytosis. Repeat CBC and Coombs test planned, no hemolysis noted, and no transfusion required at this time. Previous CT imaging and bone marrow biopsy showed no primary hematologic disorder; nutritional deficiencies and other causes previously ruled out. Patient reported increased fatigue, reduced appetite, and weight loss.  Rest of the pertinent 10 point ROS reviewed and neg.   MEDICAL HISTORY:  Past Medical History:  Diagnosis Date   Arthritis    Diabetes mellitus without complication (HCC)    Hypercholesterolemia    Hypertension     SURGICAL HISTORY: Past Surgical History:  Procedure Laterality Date   IR BONE MARROW BIOPSY & ASPIRATION  12/17/2023   LAPARASCOPIC MEDICAN ARCUATE LIGAMENT RELEASE     MINOR HEMORRHOIDECTOMY N/A     SOCIAL HISTORY: Social History   Socioeconomic History   Marital status: Married    Spouse name: Not on file   Number of children: Not on file   Years of education: Not on file   Highest education level: Not on file   Occupational History  Not on file  Tobacco Use   Smoking status: Former    Types: Cigarettes   Smokeless tobacco: Never  Vaping Use   Vaping status: Not on file  Substance and Sexual Activity   Alcohol use: Yes    Comment: rarely   Drug use: Never   Sexual activity: Not on file  Other Topics Concern   Not on file  Social History Narrative   Pt lives with wife    Retired    Social Drivers of Health   Tobacco Use: Medium Risk (03/31/2024)   Patient History    Smoking Tobacco Use: Former    Smokeless Tobacco Use: Never    Passive Exposure: Not on Actuary Strain: Not on file  Food Insecurity: No Food Insecurity (12/14/2023)   Epic    Worried About Programme Researcher, Broadcasting/film/video in the Last Year: Never true    Ran Out of Food in the Last Year: Never true  Transportation Needs: No Transportation Needs (12/14/2023)   Epic    Lack of Transportation (Medical): No    Lack of Transportation (Non-Medical): No  Physical Activity: Low Risk (01/13/2024)   Received from [facility]   PCARE Exercise SDOH    Exercise: Aerobic    PCare Exercise SDOH: Not on file    PCare Exercise SDOH: Not on file  Stress: Not on file  Social Connections: Moderately Integrated (12/14/2023)   Social Connection and Isolation Panel    Frequency of Communication with Friends and Family: More than three times a week    Frequency of Social Gatherings with Friends and Family: More than three times a week    Attends Religious Services: More than 4 times per year    Active Member of [employer] or Organizations: No    Attends Banker Meetings: Never    Marital Status: Married  Catering Manager Violence: Not At Risk (12/15/2023)   Epic    Fear of Current or Ex-Partner: No    Emotionally Abused: No    Physically Abused: No    Sexually Abused: No  Depression (PHQ2-9): Low Risk (04/02/2024)   Depression (PHQ2-9)    PHQ-2 Score: 0  Alcohol Screen: Not on file  Housing: Low Risk  (12/14/2023)   Epic    Unable to Pay for Housing in the Last Year: No    Number of Times Moved in the Last Year: 1    Homeless in the Last Year: No  Utilities: Not At Risk (12/14/2023)   Epic    Threatened with loss of utilities: No  Health Literacy: Not on file    FAMILY HISTORY: Family History  Problem Relation Age of Onset   Alzheimer's disease Mother    Cancer Neg Hx    Parkinson's disease Neg Hx    Tremor Neg Hx     ALLERGIES:  is allergic to metformin.  MEDICATIONS:  Current Outpatient Medications  Medication Sig Dispense Refill   ACCU-CHEK GUIDE test strip USE TO CHECK BLOOD SUGARS THREE TIMES DAILY. 100 strip 2   Accu-Chek Softclix Lancets lancets 3 times daily As directed DXE11.65 200 each 3   alfuzosin  (UROXATRAL ) 10 MG 24 hr tablet Take 10 mg by mouth daily.     ALPRAZolam  (XANAX ) 0.5 MG tablet Take 0.5 mg by mouth every other day as needed for anxiety. Take 1/2 tablet by mouth every other day as needed.     atorvastatin  (LIPITOR) 80 MG tablet Take 0.5 tablets (40 mg total)  by mouth daily. 90 tablet 3   buPROPion  (WELLBUTRIN  XL) 300 MG 24 hr tablet Take 300 mg by mouth daily.     busPIRone  (BUSPAR ) 10 MG tablet Take 5-10 mg by mouth 2 (two) times daily as needed (Anxiety). TAKE 1/2 TO 1 TABLET TWICE A DAY AS NEEDED FOR ANXIETY     cyanocobalamin  (VITAMIN B12) 1000 MCG/ML injection Inject 1 mL (1,000 mcg total) into the muscle once a week. 1 mL 7   Eluxadoline  (VIBERZI ) 75 MG TABS Take 75 mg by mouth 2 (two) times daily.     finasteride  (PROSCAR ) 5 MG tablet Take 5 mg by mouth daily.     glimepiride  (AMARYL ) 4 MG tablet Take 1 tablet (4 mg total) by mouth daily with breakfast. 90 tablet 4   mometasone (NASONEX) 50 MCG/ACT nasal spray Place 2 sprays into the nose daily as needed (Sinus).  5   NEEDLE, DISP, 23 G (BD DISP NEEDLE) 23G X 1 MISC Brand substitution OK 8 each 0   tadalafil (CIALIS) 5 MG tablet Take 5 mg by mouth daily as needed for erectile dysfunction.      Vilazodone  HCl 20 MG TABS Take 20 mg by mouth daily.     No current facility-administered medications for this visit.     PHYSICAL EXAMINATION: ECOG PERFORMANCE STATUS: 0 - Asymptomatic  Vitals:   04/02/24 1334  BP: 122/60  Pulse: 70  Resp: 18  Temp: (!) 97.3 F (36.3 C)  SpO2: 95%     Filed Weights   04/02/24 1334  Weight: 162 lb 11.2 oz (73.8 kg)      GENERAL:alert, no distress and comfortable,   LABORATORY DATA:  I have reviewed the data as listed Lab Results  Component Value Date   WBC 4.5 04/02/2024   HGB 7.9 (L) 04/02/2024   HCT 24.2 (L) 04/02/2024   MCV 94.5 04/02/2024   PLT 124 (L) 04/02/2024     Chemistry      Component Value Date/Time   NA 143 03/27/2024 1245   NA 140 02/13/2019 1558   K 4.8 03/27/2024 1245   CL 109 03/27/2024 1245   CO2 23 03/27/2024 1245   BUN 32 (H) 03/27/2024 1245   BUN 19 02/13/2019 1558   CREATININE 1.99 (H) 03/27/2024 1245      Component Value Date/Time   CALCIUM  9.1 03/27/2024 1245   ALKPHOS 71 03/27/2024 1245   AST 19 03/27/2024 1245   ALT 18 03/27/2024 1245   BILITOT 0.8 03/27/2024 1245       RADIOGRAPHIC STUDIES: I have personally reviewed the radiological images as listed and agreed with the findings in the report. No results found.   All questions were answered. The patient knows to call the clinic with any problems, questions or concerns. I spent 30 minutes in the care of this patient including H and P, review of records, counseling and coordination of care.     Amber Stalls, MD 04/02/2024 2:01 PM

## 2024-04-03 ENCOUNTER — Telehealth: Payer: Self-pay | Admitting: Hematology and Oncology

## 2024-04-03 NOTE — Telephone Encounter (Signed)
 I left voicemail for patient regarding scheduled injection appointment on 04/09/2024 and upcoming lab and MD appointments.

## 2024-04-09 ENCOUNTER — Inpatient Hospital Stay

## 2024-04-16 ENCOUNTER — Inpatient Hospital Stay

## 2024-04-30 ENCOUNTER — Inpatient Hospital Stay: Admitting: Hematology and Oncology

## 2024-04-30 ENCOUNTER — Inpatient Hospital Stay: Attending: Hematology and Oncology

## 2024-07-07 ENCOUNTER — Ambulatory Visit: Admitting: Endocrinology
